# Patient Record
Sex: Male | Born: 1950 | Race: White | Hispanic: No | Marital: Married | Smoking: Former smoker
Health system: Southern US, Community
[De-identification: ages and names within clinical notes are randomized; demographics above are authoritative.]

## PROBLEM LIST (undated history)

## (undated) DIAGNOSIS — K219 Gastro-esophageal reflux disease without esophagitis: Secondary | ICD-10-CM

## (undated) DIAGNOSIS — F32A Depression, unspecified: Secondary | ICD-10-CM

## (undated) DIAGNOSIS — Z87891 Personal history of nicotine dependence: Secondary | ICD-10-CM

## (undated) DIAGNOSIS — F1020 Alcohol dependence, uncomplicated: Secondary | ICD-10-CM

## (undated) DIAGNOSIS — B1920 Unspecified viral hepatitis C without hepatic coma: Secondary | ICD-10-CM

## (undated) DIAGNOSIS — F329 Major depressive disorder, single episode, unspecified: Secondary | ICD-10-CM

## (undated) HISTORY — PX: VASECTOMY: SHX75

## (undated) HISTORY — DX: Unspecified viral hepatitis C without hepatic coma: B19.20

## (undated) HISTORY — PX: COLONOSCOPY: SHX174

## (undated) HISTORY — DX: Depression, unspecified: F32.A

## (undated) HISTORY — DX: Major depressive disorder, single episode, unspecified: F32.9

## (undated) HISTORY — DX: Personal history of nicotine dependence: Z87.891

## (undated) HISTORY — DX: Alcohol dependence, uncomplicated: F10.20

## (undated) HISTORY — DX: Gastro-esophageal reflux disease without esophagitis: K21.9

---

## 2003-12-19 ENCOUNTER — Encounter (INDEPENDENT_AMBULATORY_CARE_PROVIDER_SITE_OTHER): Payer: Self-pay | Admitting: Specialist

## 2003-12-19 ENCOUNTER — Ambulatory Visit (HOSPITAL_COMMUNITY): Admission: RE | Admit: 2003-12-19 | Discharge: 2003-12-19 | Payer: Self-pay | Admitting: Gastroenterology

## 2005-02-07 ENCOUNTER — Encounter: Admission: RE | Admit: 2005-02-07 | Discharge: 2005-02-07 | Payer: Self-pay | Admitting: Family Medicine

## 2005-10-20 LAB — HM COLONOSCOPY

## 2006-07-08 ENCOUNTER — Ambulatory Visit: Payer: Self-pay | Admitting: Family Medicine

## 2006-07-23 ENCOUNTER — Ambulatory Visit: Payer: Self-pay | Admitting: Family Medicine

## 2006-09-22 ENCOUNTER — Ambulatory Visit: Payer: Self-pay | Admitting: Family Medicine

## 2006-11-23 ENCOUNTER — Ambulatory Visit: Payer: Self-pay | Admitting: Family Medicine

## 2007-02-24 ENCOUNTER — Ambulatory Visit: Payer: Self-pay | Admitting: Family Medicine

## 2007-12-14 ENCOUNTER — Ambulatory Visit: Payer: Self-pay | Admitting: Family Medicine

## 2008-09-05 ENCOUNTER — Emergency Department (HOSPITAL_COMMUNITY): Admission: EM | Admit: 2008-09-05 | Discharge: 2008-09-05 | Payer: Self-pay | Admitting: Family Medicine

## 2008-10-16 ENCOUNTER — Ambulatory Visit: Payer: Self-pay | Admitting: Family Medicine

## 2009-05-29 ENCOUNTER — Ambulatory Visit: Payer: Self-pay | Admitting: Family Medicine

## 2009-06-21 ENCOUNTER — Ambulatory Visit: Payer: Self-pay | Admitting: Family Medicine

## 2009-12-31 ENCOUNTER — Ambulatory Visit: Payer: Self-pay | Admitting: Family Medicine

## 2010-05-30 ENCOUNTER — Ambulatory Visit: Payer: Self-pay | Admitting: Family Medicine

## 2010-09-26 ENCOUNTER — Ambulatory Visit: Payer: Self-pay | Admitting: Family Medicine

## 2010-11-26 ENCOUNTER — Ambulatory Visit (INDEPENDENT_AMBULATORY_CARE_PROVIDER_SITE_OTHER): Payer: 59 | Admitting: Family Medicine

## 2010-11-26 DIAGNOSIS — I951 Orthostatic hypotension: Secondary | ICD-10-CM

## 2012-03-02 ENCOUNTER — Encounter: Payer: Self-pay | Admitting: Internal Medicine

## 2012-03-08 ENCOUNTER — Ambulatory Visit (INDEPENDENT_AMBULATORY_CARE_PROVIDER_SITE_OTHER): Payer: 59 | Admitting: Family Medicine

## 2012-03-08 ENCOUNTER — Encounter: Payer: Self-pay | Admitting: Family Medicine

## 2012-03-08 VITALS — BP 122/80 | HR 80 | Ht 68.5 in | Wt 162.0 lb

## 2012-03-08 DIAGNOSIS — Z2911 Encounter for prophylactic immunotherapy for respiratory syncytial virus (RSV): Secondary | ICD-10-CM

## 2012-03-08 DIAGNOSIS — Z8619 Personal history of other infectious and parasitic diseases: Secondary | ICD-10-CM

## 2012-03-08 DIAGNOSIS — J3089 Other allergic rhinitis: Secondary | ICD-10-CM | POA: Insufficient documentation

## 2012-03-08 DIAGNOSIS — K1370 Unspecified lesions of oral mucosa: Secondary | ICD-10-CM

## 2012-03-08 DIAGNOSIS — K219 Gastro-esophageal reflux disease without esophagitis: Secondary | ICD-10-CM

## 2012-03-08 DIAGNOSIS — J302 Other seasonal allergic rhinitis: Secondary | ICD-10-CM

## 2012-03-08 DIAGNOSIS — Z860100 Personal history of colon polyps, unspecified: Secondary | ICD-10-CM | POA: Insufficient documentation

## 2012-03-08 DIAGNOSIS — J309 Allergic rhinitis, unspecified: Secondary | ICD-10-CM

## 2012-03-08 DIAGNOSIS — Z0000 Encounter for general adult medical examination without abnormal findings: Secondary | ICD-10-CM

## 2012-03-08 LAB — POCT URINALYSIS DIPSTICK
Blood, UA: NEGATIVE
Glucose, UA: NEGATIVE
Leukocytes, UA: NEGATIVE
Nitrite, UA: NEGATIVE
Protein, UA: NEGATIVE
Urobilinogen, UA: NEGATIVE

## 2012-03-08 LAB — COMPREHENSIVE METABOLIC PANEL
CO2: 23 mEq/L (ref 19–32)
Calcium: 9.6 mg/dL (ref 8.4–10.5)
Chloride: 106 mEq/L (ref 96–112)
Creat: 1.15 mg/dL (ref 0.50–1.35)
Glucose, Bld: 91 mg/dL (ref 70–99)
Potassium: 4.4 mEq/L (ref 3.5–5.3)

## 2012-03-08 LAB — LIPID PANEL
HDL: 47 mg/dL (ref >39)
LDL Cholesterol: 78 mg/dL (ref 0–99)
Total CHOL/HDL Ratio: 3.3 Ratio

## 2012-03-08 MED ORDER — FLUTICASONE PROPIONATE 50 MCG/ACT NA SUSP: 2.0000 | Freq: Every day | NASAL | Status: DC

## 2012-03-08 NOTE — Progress Notes (Signed)
Subjective:    Patient ID: Harold Day, male    DOB: Aug 05, 1951, 61 y.o.   MRN: [id number]  HPI He is here for a complete examination. He has no particular complaints. He continues on medications listed in the chart. He does get good relief of his reflux symptoms with Prilosec. He does use Afrin nasal spray mainly at night. He does have a seasonal variation of his allergies. He has an underlying history of hepatitis C and has been evaluated for this in the past. Apparently no therapy was recommended. He does have a history of colonic polyps and will need another colonoscopy. He also recently saw his dentist and does have a lesion present in the right lower mandibular mucosal area. He is now semiretired and is keeping herself quite busy. He exercises very regularly and states that he is in the best shape he has ever been in. His social history was reviewed.   Review of Systems  Constitutional: Negative.   HENT: Negative.   Eyes: Negative.   Respiratory: Negative.   Cardiovascular: Negative.   Gastrointestinal: Negative.   Genitourinary: Negative.   Musculoskeletal: Negative.   Skin: Negative.   Neurological: Negative.        Objective:   Physical Exam BP 122/80  Pulse 80  Ht 5' 8.5" (1.74 m)  Wt 162 lb (73.483 kg)  BMI 24.27 kg/m2  General Appearance:    Alert, cooperative, no distress, appears stated age  Head:    Normocephalic, without obvious abnormality, atraumatic  Eyes:    PERRL, conjunctiva/corneas clear, EOM's intact, fundi    benign  Ears:    Normal TM's and external ear canals  Nose:   Nares normal, mucosa normal, no drainage or sinus   tenderness  Throat:   Lips, mucosa, and tongue normal; teeth and gums normal, reddish purple lesion approximately 1 x 0.5 cm noted in the mucosa in the right mandibular area   Neck:   Supple, no lymphadenopathy;  thyroid:  no   enlargement/tenderness/nodules; no carotid   bruit or JVD  Back:    Spine nontender, no curvature, ROM  normal, no CVA     tenderness  Lungs:     Clear to auscultation bilaterally without wheezes, rales or     ronchi; respirations unlabored  Chest Wall:    No tenderness or deformity   Heart:    Regular rate and rhythm, S1 and S2 normal, no murmur, rub   or gallop  Breast Exam:    No chest wall tenderness, masses or gynecomastia  Abdomen:     Soft, non-tender, nondistended, normoactive bowel sounds,    no masses, no hepatosplenomegaly  Genitalia:    Normal male external genitalia without lesions.  Testicles without masses.  No inguinal hernias.  Rectal:    Normal sphincter tone, no masses or tenderness; guaiac negative stool.  Prostate smooth, no nodules, not enlarged.  Extremities:   No clubbing, cyanosis or edema  Pulses:   2+ and symmetric all extremities  Skin:   Skin color, texture, turgor normal, no rashes or lesions  Lymph nodes:   Cervical, supraclavicular, and axillary nodes normal  Neurologic:   CNII-XII intact, normal strength, sensation and gait; reflexes 2+ and symmetric throughout          Psych:   Normal mood, affect, hygiene and grooming.           Assessment & Plan:   1. Routine general medical examination at a health care facility  POCT Urinalysis  Dipstick, Varicella-zoster vaccine subcutaneous, CBC with Differential, Comprehensive metabolic panel, Lipid panel  2. Hx of colonic polyps  [facility] COLONOSCOPY  3. GERD (gastroesophageal reflux disease)    4. History of hepatitis C  Comprehensive metabolic panel  5. Perennial allergic rhinitis with seasonal variation  fluticasone (FLONASE) 50 MCG/ACT nasal spray  6. Oral mucosal lesion  Ambulatory referral to Oral Maxillofacial Surgery   recommend he back off on using the Afrin nasal spray. No further therapy for the hepatitis C. Encouraged him to continue with his present lifestyle.

## 2012-03-08 NOTE — Patient Instructions (Signed)
Add one baby aspirin to your daily regimen

## 2012-03-09 LAB — CBC WITH DIFFERENTIAL/PLATELET
Basophils Relative: 1 % (ref 0–1)
Eosinophils Relative: 1 % (ref 0–5)
Hemoglobin: 15 g/dL (ref 13.0–17.0)
Lymphocytes Relative: 20 % (ref 12–46)
Lymphs Abs: 1.8 10*3/uL (ref 0.7–4.0)
MCHC: 34.5 g/dL (ref 30.0–36.0)
Neutro Abs: 6.4 10*3/uL (ref 1.7–7.7)
Platelets: 237 10*3/uL (ref 150–400)
RDW: 14.1 % (ref 11.5–15.5)

## 2012-03-09 NOTE — Progress Notes (Signed)
Quick Note:  The blood work is normal ______ 

## 2012-04-13 ENCOUNTER — Encounter: Payer: Self-pay | Admitting: Medical

## 2012-04-13 ENCOUNTER — Ambulatory Visit (INDEPENDENT_AMBULATORY_CARE_PROVIDER_SITE_OTHER): Payer: 59 | Admitting: Medical

## 2012-04-13 VITALS — BP 138/80 | HR 68 | Temp 98.0°F | Resp 16 | Wt 160.0 lb

## 2012-04-13 DIAGNOSIS — R002 Palpitations: Secondary | ICD-10-CM

## 2012-04-13 NOTE — Progress Notes (Signed)
Subjective:   HPI  Harold Day is a 61 y.o. male who presents for c/o palpitations.   He denies hx/o heart problems in self or family, no prior cardiology workup.  He notes over the last few days he has had steady frequent skipped heart beats. He notices them each time, gets a thump against his chest.  This is happening daily, somewhat frequent.  He denies pain, SOB, wheezing, fatigue, or dizziness.  No syncope. No recent changes in diet or activity, no excessive caffeine intake.  He is retried, not under stress.  Otherwise has been in his usual state of health.  Recently was here for general recheck and had recent labs in May.   No other aggravating or relieving factors.    No other c/o.  The following portions of the patient's history were reviewed and updated as appropriate: allergies, current medications, past family history, past medical history, past social history, past surgical history and problem list.  No Known Allergies  Current Outpatient Prescriptions on File Prior to Visit  Medication Sig Dispense Refill  . fish oil-omega-3 fatty acids 1000 MG capsule Take 2 g by mouth daily.      . fluticasone (FLONASE) 50 MCG/ACT nasal spray Place 2 sprays into the nose daily.  16 g  11  . Multiple Vitamins-Minerals (MULTIVITAMIN WITH MINERALS) tablet Take 1 tablet by mouth daily.      Marland Kitchen omeprazole (PRILOSEC) 10 MG capsule Take 10 mg by mouth daily.        Past Medical History  Diagnosis Date  . Allergic rhinitis   . Alcoholic   . GERD (gastroesophageal reflux disease)   . Former smoker     quit 2011    Past Surgical History  Procedure Date  . Vasectomy   . Colonoscopy     No family history on file.  History   Social History  . Marital Status: Married    Spouse Name: N/A    Number of Children: N/A  . Years of Education: N/A   Occupational History  . Not on file.   Social History Main Topics  . Smoking status: Former Games developer  . Smokeless tobacco: Current User   Types: Snuff  . Alcohol Use: No  . Drug Use: No  . Sexually Active: Yes   Other Topics Concern  . Not on file   Social History Narrative  . No narrative on file    Reviewed their medical, surgical, family, social, medication, and allergy history and updated chart as appropriate.   Review of Systems ROS reviewed and was negative other than noted in HPI or above.    Objective:   Physical Exam  General appearance: alert, no distress, WD/WN Neck: supple, no lymphadenopathy, no thyromegaly, no masses, no bruits Heart: RRR, normal S1, S2, no murmurs Lungs: CTA bilaterally, no wheezes, rhonchi, or rales Abdomen: +bs, soft, non tender, non distended, no masses, no hepatomegaly, no splenomegaly, no bruits Pulses: occasional skipped beats, but otherwise regular, symmetric UE and LE, 2+ pulses   Adult ECG Report  Indication: palpitations  Rate: 64 bpm  Rhythm: normal sinus rhythm  QRS Axis: 28 degrees  PR Interval:  QRS Duration: 88ms  QTc:  Conduction Disturbances: occasional PVCs  Other Abnormalities: none  Patient's cardiac risk factors are: advanced age (older than 74 for men, 52 for women), male gender, smoking/ tobacco exposure and long term tobacco use, but quit 2011.  EKG comparison: 2012, normal EKG, but today's with PVCs.  Narrative Interpretation: occasional PVCs, otherwise normal EKG    Assessment and Plan :     Encounter Diagnosis  Name Primary?  . Palpitations Yes   EKG with PVCs.  Given his frequent PVCs, risk factors of male age 79yo, long hx/o tobacco use, we will refer to cardiology for evaluation for palpitations and for general cardiac screening.  I reviewed his EKG today, recent labs from May 2013, and discussed his concerns.  He will f/u with cardiology.

## 2012-04-14 ENCOUNTER — Encounter: Payer: Self-pay | Admitting: Medical

## 2012-04-14 ENCOUNTER — Telehealth: Payer: Self-pay | Admitting: Family Medicine

## 2012-04-14 NOTE — Telephone Encounter (Signed)
Message copied by Janeice Robinson on Wed Apr 14, 2012 11:11 AM ------      Message from: Aleen Campi, DAVID S      Created: Wed Apr 14, 2012  7:29 AM       Refer to Dr. Donnie Aho for palpitations and cardiac screening.  Send OV, last labs, both old and new EKG.

## 2012-04-14 NOTE — Telephone Encounter (Signed)
PATIENT IS AWARE OF HIS APPOINTMENT ON 04/14/12 @ 1112 AM. CLS  DR. TILLEY [address] [place], [place]   June 27TH, 13 @ 245 PM    I USED EPIC TO FAX OVER LAST OV NOTES, LABS AND INSURANCE CARD. CLS

## 2012-04-15 ENCOUNTER — Other Ambulatory Visit: Payer: Self-pay | Admitting: Cardiology

## 2012-04-15 DIAGNOSIS — I493 Ventricular premature depolarization: Secondary | ICD-10-CM | POA: Insufficient documentation

## 2012-04-15 NOTE — Progress Notes (Signed)
Harold Day    Date of visit:  04/15/2012 DOB:  25-Jan-1951    Age:  60 yrs. Medical record number:  [id number]     Account number:  [id number] Primary Care Provider: Ernst Breach ____________________________ CURRENT DIAGNOSES  1. Arrhythmia-PVC's(symptomatic)  2. Palpitations  3. Other specified cardiac dysrhythmias  4. Ill-Defined Heart Disease Nec  5. GERD ____________________________ ALLERGIES  NKDA ____________________________ MEDICATIONS  1. Fish Oil 1,000 mg capsule, 2 p.o. daily  2. Flonase 50 mcg/actuation Spray, Suspension, 2 sprays qd  3. multivitamin tablet, 1 p.o. daily  4. omeprazole 10 mg capsule,delayed release(DR/EC), 1 p.o. daily  5. aspirin 81 mg tablet, chewable, 1 p.o. daily ____________________________ CHIEF COMPLAINTS Evaluation of a skipped heartbeat  ____________________________ HISTORY OF PRESENT ILLNESS  This nice 23 ear-old male is seen for evaluation of palpitations. He developed irregularity in his pulse over the past week or so. It was more noticeable at rest but has been a constant feeling as if his heart is skipping or missing a beat. He has been able to work out and denies angina. He has no PND, orthopnea, syncope, palpitations, or claudication. It happens daily. He has not been using over-the-counter decongestants and denies excess caffeine or alcohol consumption. He has had no dizziness or syncope with this.  ____________________________ PAST HISTORY  Past Medical Illnesses:  GERD;  Cardiovascular Illnesses:  no previous history of cardiac disease.;  Surgical Procedures:  vasectomy;  Cardiology Procedures-Invasive:  no history of prior cardiac procedures;  Cardiology Procedures-Noninvasive:  no previous non-invasive procedures;  LVEF not documented ____________________________ CARDIO-PULMONARY TEST DATES EKG Date:  04/15/2012;   ____________________________ FAMILY HISTORY Father - age 50, died of CHF; Mother - age 23, died of breast  cancer and lung cancer, d; Sister 1 - age 32,  alive and well;  ____________________________ SOCIAL HISTORY Alcohol Use:  no alcohol use;  Smoking:  used to smoke but quit 2011;  Diet:  regular diet;  Lifestyle:  married and 2 sons;  Exercise:  gym 4 x week;  Occupation:  retired and Comptroller;  Residence:  lives with wife;   ____________________________ REVIEW OF SYSTEMS General:  denies recent weight change, fatique or change in exercise tolerance.  Integumentary:  no rashes or new skin lesions. Eyes:  wears eye glasses/contact lenses, denies diplopia, glaucoma or visual field defects.  Ears, Nose, Throat, Mouth:  denies any hearing loss, epistaxis, hoarseness or difficulty speaking. Respiratory:  denies dyspnea, cough, wheezing or hemoptysis.  Cardiovascular:  please review HPI  Abdominal:  history of GERD  Genitourinary-Male:  no dysuria, urgency, frequency, or nocturia  Musculoskeletal:  denies arthritis, venous insufficiency, or muscle weakness.  Neurological:  denies headaches, stroke, or TIA  Psychiatric:  denies depession or anxiety.  Hematological/Immunologic:  denies any food allergies, bleeding disorders. ____________________________ PHYSICAL EXAMINATION VITAL SIGNS  Blood Pressure:  130/80 Sitting, Right arm, regular cuff  , 122/76 Standing, Right arm and regular cuff   Pulse:  74/min. Weight:  161.00 lbs. Height:  68.5"BMI: 24  Constitutional:  pleasant white male in no acute distress Skin:  warm and dry to touch, no apparent skin lesions, or masses noted. Head:  normocephalic, normal hair pattern, no masses or tenderness Eyes:  EOMS Intact, PERRLA, C and S clear, Funduscopic exam not done. ENT:  ears, nose and throat reveal no gross abnormalities.  Dentition good. Neck:  supple, without massess. No JVD, thyromegaly or carotid bruits. Carotid upstroke normal. Chest:  normal symmetry, clear to auscultation  and percussion. Cardiac:  regular rhythm, normal S1 and S2,  No S3 or S4, no murmurs, gallops or rubs detected. Abdomen:  abdomen soft,non-tender, no masses, no hepatospenomegaly, or aneurysm noted Peripheral Pulses:  the femoral,dorsalis pedis, and posterior tibial pulses are full and equal bilaterally with no bruits auscultated. Extremities & Back:  no deformities, clubbing, cyanosis, erythema or edema observed. Normal muscle strength and tone. Neurological:  no gross motor or sensory deficits noted, affect appropriate, oriented x3. ____________________________ MOST RECENT LIPID PANEL 03/08/12  CHOL TOTL 156 mg/dl, LDL 78 calc, HDL 47 mg/dl, TRIGLYCER 409 mg/dl and CHOL/HDL 3.3 (Calc) ____________________________ IMPRESSIONS/PLAN  1. Symptomatic PVCs with apparently a normal heart otherwise 2. History of reflux  Recommendations:  I like to confirm that he has a normal heart. He does have PVCs on his EKG but otherwise his EKG was normal. I would like him to have a TSH and for him to have an exercise treadmill test as well as an echo Doppler test to look for cardiomyopathy or valvular disease.. Follow up afterwards. ____________________________ TODAYS ORDERS  1. treadmill:  Regular TM At [facility]  2. 2D, color flow, doppler: First Available  3. TSH: Today  4. 12 Lead EKG: Today                       ____________________________ Cardiology Physician:  Darden Palmer MD [facility]

## 2012-04-28 ENCOUNTER — Other Ambulatory Visit: Payer: Self-pay | Admitting: Cardiology

## 2012-04-30 ENCOUNTER — Encounter: Payer: Self-pay | Admitting: Family Medicine

## 2012-05-03 ENCOUNTER — Encounter: Payer: Self-pay | Admitting: Family Medicine

## 2012-05-13 ENCOUNTER — Ambulatory Visit (INDEPENDENT_AMBULATORY_CARE_PROVIDER_SITE_OTHER): Payer: 59 | Admitting: Family Medicine

## 2012-05-13 ENCOUNTER — Encounter: Payer: Self-pay | Admitting: Family Medicine

## 2012-05-13 VITALS — BP 120/84 | HR 84 | Temp 98.2°F | Ht 68.5 in | Wt 160.0 lb

## 2012-05-13 DIAGNOSIS — R197 Diarrhea, unspecified: Secondary | ICD-10-CM

## 2012-05-13 NOTE — Patient Instructions (Addendum)
Viral Gastroenteritis Viral gastroenteritis is also known as stomach flu. This condition affects the stomach and intestinal tract. It can cause sudden diarrhea and vomiting. The illness typically lasts 3 to 8 days. Most people develop an immune response that eventually gets rid of the virus. While this natural response develops, the virus can make you quite ill. CAUSES  Many different viruses can cause gastroenteritis, such as rotavirus or noroviruses. You can catch one of these viruses by consuming contaminated food or water. You may also catch a virus by sharing utensils or other personal items with an infected person or by touching a contaminated surface. SYMPTOMS  The most common symptoms are diarrhea and vomiting. These problems can cause a severe loss of body fluids (dehydration) and a body salt (electrolyte) imbalance. Other symptoms may include:  Fever.   Headache.   Fatigue.   Abdominal pain.  DIAGNOSIS  Your caregiver can usually diagnose viral gastroenteritis based on your symptoms and a physical exam. A stool sample may also be taken to test for the presence of viruses or other infections. TREATMENT  This illness typically goes away on its own. Treatments are aimed at rehydration. The most serious cases of viral gastroenteritis involve vomiting so severely that you are not able to keep fluids down. In these cases, fluids must be given through an intravenous line (IV). HOME CARE INSTRUCTIONS   Drink enough fluids to keep your urine clear or pale yellow. Drink small amounts of fluids frequently and increase the amounts as tolerated.   Ask your caregiver for specific rehydration instructions.   Avoid:   Foods high in sugar.   Alcohol.   Carbonated drinks.   Tobacco.   Juice.   Caffeine drinks.   Extremely hot or cold fluids.   Fatty, greasy foods.   Too much intake of anything at one time.   Dairy products until 24 to 48 hours after diarrhea stops.   You may  consume probiotics. Probiotics are active cultures of beneficial bacteria. They may lessen the amount and number of diarrheal stools in adults. Probiotics can be found in yogurt with active cultures and in supplements.   Wash your hands well to avoid spreading the virus.   Only take over-the-counter or prescription medicines for pain, discomfort, or fever as directed by your caregiver. Do not give aspirin to children. Antidiarrheal medicines are not recommended.   Ask your caregiver if you should continue to take your regular prescribed and over-the-counter medicines.   Keep all follow-up appointments as directed by your caregiver.  SEEK IMMEDIATE MEDICAL CARE IF:   You are unable to keep fluids down.   You do not urinate at least once every 6 to 8 hours.   You develop shortness of breath.   You notice blood in your stool or vomit. This may look like coffee grounds.   You have abdominal pain that increases or is concentrated in one small area (localized).   You have persistent vomiting or diarrhea.   You have a fever.   The patient is a child younger than 3 months, and he or she has a fever.   The patient is a child older than 3 months, and he or she has a fever and persistent symptoms.   The patient is a child older than 3 months, and he or she has a fever and symptoms suddenly get worse.   The patient is a baby, and he or she has no tears when crying.  MAKE SURE YOU:     Understand these instructions.   Will watch your condition.   Will get help right away if you are not doing well or get worse.  Document Released: 10/06/2005 Document Revised: 09/25/2011 Document Reviewed: 07/23/2011 [facility] Patient Information 2012 [place], [place].  Your illness might be viral--can't exclude food poisoning given that you had some undercooked meat.  Either way, since your diarrhea has improved, likely your fevers will improve, and you should be feeling much better in the next few  days.  Bland diet, advance as tolerated. Avoid dairy for about 5-7 days.

## 2012-05-13 NOTE — Progress Notes (Signed)
Chief Complaint  Patient presents with  . Fever    woke up Monday night with high fever, chills. Was ok Tues. Wed fever returned and diarrhea began. Really bad abdominal cramping today. Last dose ibuprofen was about 60 minutes ago-600mg .   HPI: 3 nights ago awoke with subjective fever and chills.  Felt better the next morning, and was fine all day Tuesday.  Yesterday, began with headache, fever started coming back.  Last night the diarrhea began.  Had 3 large episodes of semi-formed stool.  Denies blood in stool or black stools.  Describes the abdominal pain as "the worst cramps I ever had".  Hasn't had any diarrhea today, but ongoing abdominal cramping.  Has had some nausea, but no vomiting.  Decreased appetite, was able to eat 1/2 sandwich today.    Denies any sick contacts, recent antibiotic use.  Denies any travel, camping.  Ate some undercooked chicken at his mother-in-law's on Sunday.  No others got sick, but they didn't eat as much of the meat prior to cooking it further.   Had colonoscopy in past--doesn't recall any h/o diverticulosis.  He is due for a repeat colonoscopy with Dr. Elnoria Howard soon.  Past Medical History  Diagnosis Date  . Allergic rhinitis   . Alcoholic   . GERD (gastroesophageal reflux disease)   . Former smoker     quit 2011  . Hepatitis C     noticed when he tried to give blood; never had illness   Past Surgical History  Procedure Date  . Vasectomy   . Colonoscopy    History   Social History  . Marital Status: Married    Spouse Name: N/A    Number of Children: 2  . Years of Education: N/A   Occupational History  . retired (Armed forces technical officer)    Social History Main Topics  . Smoking status: Former Games developer  . Smokeless tobacco: Current User    Types: Snuff  . Alcohol Use: No     no alcohol since about 2000  . Drug Use: No  . Sexually Active: Yes   Other Topics Concern  . Not on file   Social History Narrative  . No narrative on file   Current  Outpatient Prescriptions on File Prior to Visit  Medication Sig Dispense Refill  . aspirin 81 MG tablet Take 81 mg by mouth daily.      . fish oil-omega-3 fatty acids 1000 MG capsule Take 2 g by mouth daily.      . fluticasone (FLONASE) 50 MCG/ACT nasal spray Place 2 sprays into the nose daily.  16 g  11  . Multiple Vitamins-Minerals (MULTIVITAMIN WITH MINERALS) tablet Take 1 tablet by mouth daily.      Marland Kitchen omeprazole (PRILOSEC) 10 MG capsule Take 10 mg by mouth daily.       No Known Allergies  ROS:  No URI symptoms.  Had bad heartburn a few days ago, prior to the onset of fever.  Denies cough.  Denies chest pain.  Denies skin rashes, myalgias, but having some headaches (possibly related to fever). ee HPI.   PHYSICAL EXAM: BP 120/84  Pulse 84  Temp 98.2 F (36.8 C) (Oral)  Ht 5' 8.5" (1.74 m)  Wt 160 lb (72.576 kg)  BMI 23.97 kg/m2 Well appearing male, in no distress HEENT: conjunctiva and sclera are clear.  Mucus membranes are moist Neck: no lymphadenopathy or mass Heart: regular rate and rhythm Lungs: clear bilaterally Abdomen: soft, nontender, no organomegaly or  mass.  Normal bowel sounds (not hyperactive, or high pitched) Skin: no rash  ASSESSMENT/PLAN:  1. Diarrhea    Etiology is likely either viral, or possibly food-bourne.  Either way, diarrhea is much improved/resolved.  Discussed supportive management including fever control, clear liquids, advancing to [employer], and advancing back to normal diet, avoiding dairy for 5-7 days.  Discussed probiotics.    Return for re-evaluation if persistent high fevers--recommended to get a thermometer to monitor--worsening abdominal pain, ongoing diarrhea, blood in stool, or other concerns

## 2012-06-29 ENCOUNTER — Encounter: Payer: Self-pay | Admitting: Family Medicine

## 2012-06-29 ENCOUNTER — Ambulatory Visit (INDEPENDENT_AMBULATORY_CARE_PROVIDER_SITE_OTHER): Payer: 59 | Admitting: Family Medicine

## 2012-06-29 VITALS — BP 140/88 | HR 74 | Wt 161.0 lb

## 2012-06-29 DIAGNOSIS — M7711 Lateral epicondylitis, right elbow: Secondary | ICD-10-CM

## 2012-06-29 DIAGNOSIS — M7710 Lateral epicondylitis, unspecified elbow: Secondary | ICD-10-CM

## 2012-06-29 NOTE — Patient Instructions (Signed)
Do as many things as you can palms up and open. Heat to the area for 20 minutes 3 times per day. You can also take an anti-inflammatory if you want

## 2012-06-29 NOTE — Progress Notes (Signed)
  Subjective:    Patient ID: Harold Day, male    DOB: [birth year], 61 y.o.   MRN: [id number]  HPI Approximately 2 weeks ago after doing 100 pushups he noted right lateral elbow pain. He has been bothering him since then especially when he picks things up with his palms down   Review of Systems     Objective:   Physical Exam Full motion of the elbow. No swelling is noted. Tender to palpation over the lateral epicondyle.       Assessment & Plan:   1. Lateral epicondylitis of right elbow    discussed doing as many things as possible pounds up and open. Also discussed using heat for 20 minutes 3 times per day and NSAID as needed. He will return here if continued difficulty.

## 2012-09-06 ENCOUNTER — Ambulatory Visit (INDEPENDENT_AMBULATORY_CARE_PROVIDER_SITE_OTHER): Payer: 59 | Admitting: Family Medicine

## 2012-09-06 ENCOUNTER — Encounter: Payer: Self-pay | Admitting: Family Medicine

## 2012-09-06 VITALS — BP 136/80 | Wt 164.0 lb

## 2012-09-06 DIAGNOSIS — M7711 Lateral epicondylitis, right elbow: Secondary | ICD-10-CM

## 2012-09-06 DIAGNOSIS — M7710 Lateral epicondylitis, unspecified elbow: Secondary | ICD-10-CM

## 2012-09-06 NOTE — Progress Notes (Signed)
  Subjective:    Patient ID: Harold Day, male    DOB: 28-Apr-1951, 61 y.o.   MRN: [id number]  HPI He continues had difficulty with right lateral elbow pain. He has tried conservative measures but has not found them useful.   Review of Systems     Objective:   Physical Exam Full motion of the elbow without pain. Tender to palpation over the right lateral epicondyle. Provocative testing also causes pain.       Assessment & Plan:   1. Right lateral epicondylitis   I discussed options with him. We discussed physical therapy versus injection. He would like to have this injected. The elbow was prepped with Betadine. 20 mg of Kenalog and 1 cc of Xylocaine was injected in 2 the area of the right lateral epicondyle being sure to not inject into the muscle tendon area. He did obtain relief of his symptoms.

## 2012-10-26 ENCOUNTER — Ambulatory Visit (INDEPENDENT_AMBULATORY_CARE_PROVIDER_SITE_OTHER): Payer: 59 | Admitting: Family Medicine

## 2012-10-26 ENCOUNTER — Encounter: Payer: Self-pay | Admitting: Family Medicine

## 2012-10-26 VITALS — BP 126/80 | HR 80 | Wt 165.0 lb

## 2012-10-26 DIAGNOSIS — K219 Gastro-esophageal reflux disease without esophagitis: Secondary | ICD-10-CM

## 2012-10-26 NOTE — Patient Instructions (Addendum)
Gastroesophageal Reflux Disease, Adult Gastroesophageal reflux disease (GERD) happens when acid from your stomach flows up into the esophagus. When acid comes in contact with the esophagus, the acid causes soreness (inflammation) in the esophagus. Over time, GERD may create small holes (ulcers) in the lining of the esophagus. CAUSES   Increased body weight. This puts pressure on the stomach, making acid rise from the stomach into the esophagus.  Smoking. This increases acid production in the stomach.  Drinking alcohol. This causes decreased pressure in the lower esophageal sphincter (valve or ring of muscle between the esophagus and stomach), allowing acid from the stomach into the esophagus.  Late evening meals and a full stomach. This increases pressure and acid production in the stomach.  A malformed lower esophageal sphincter. Sometimes, no cause is found. SYMPTOMS   Burning pain in the lower part of the mid-chest behind the breastbone and in the mid-stomach area. This may occur twice a week or more often.  Trouble swallowing.  Sore throat.  Dry cough.  Asthma-like symptoms including chest tightness, shortness of breath, or wheezing. DIAGNOSIS  Your caregiver may be able to diagnose GERD based on your symptoms. In some cases, X-rays and other tests may be done to check for complications or to check the condition of your stomach and esophagus. TREATMENT  Your caregiver may recommend over-the-counter or prescription medicines to help decrease acid production. Ask your caregiver before starting or adding any new medicines.  HOME CARE INSTRUCTIONS   Change the factors that you can control. Ask your caregiver for guidance concerning weight loss, quitting smoking, and alcohol consumption.  Avoid foods and drinks that make your symptoms worse, such as:  Caffeine or alcoholic drinks.  Chocolate.  Peppermint or mint flavorings.  Garlic and onions.  Spicy foods.  Citrus fruits,  such as oranges, lemons, or limes.  Tomato-based foods such as sauce, chili, salsa, and pizza.  Fried and fatty foods.  Avoid lying down for the 3 hours prior to your bedtime or prior to taking a nap.  Eat small, frequent meals instead of large meals.  Wear loose-fitting clothing. Do not wear anything tight around your waist that causes pressure on your stomach.  Raise the head of your bed 6 to 8 inches with wood blocks to help you sleep. Extra pillows will not help.  Only take over-the-counter or prescription medicines for pain, discomfort, or fever as directed by your caregiver.  Do not take aspirin, ibuprofen, or other nonsteroidal anti-inflammatory drugs (NSAIDs). SEEK IMMEDIATE MEDICAL CARE IF:   You have pain in your arms, neck, jaw, teeth, or back.  Your pain increases or changes in intensity or duration.  You develop nausea, vomiting, or sweating (diaphoresis).  You develop shortness of breath, or you faint.  Your vomit is green, yellow, black, or looks like coffee grounds or blood.  Your stool is red, bloody, or black. These symptoms could be signs of other problems, such as heart disease, gastric bleeding, or esophageal bleeding. MAKE SURE YOU:   Understand these instructions.  Will watch your condition.  Will get help right away if you are not doing well or get worse. Document Released: 07/16/2005 Document Revised: 12/29/2011 Document Reviewed: 04/25/2011 [facility] Patient Information 2013 [place], [place]. Double your Prilosec for the next several days and if that doesn't work call me. Avoid foods that you know cause indigestion

## 2012-10-26 NOTE — Progress Notes (Signed)
  Subjective:    Patient ID: Harold Day, male    DOB: Dec 14, 1950, 62 y.o.   MRN: [id number]  HPI He complains of a one-month history of difficulty with mid chest burning especially at night when he lays down. It's intermittent in nature. He has obtained some relief with Gaviscon as well as baking soda. He also notes worse voice occasionally. He does take one Prilosec daily.   Review of Systems     Objective:   Physical Exam Alert and in no distress. Cardiac exam shows regular rhythm without murmurs or gallops. Lungs are clear to auscultation. Abdominal exam shows no masses or tenderness.       Assessment & Plan:   1. GERD (gastroesophageal reflux disease)   Double your Prilosec for the next several days and if that doesn't work call me. Avoid foods that you know cause indigestion

## 2013-03-15 ENCOUNTER — Telehealth: Payer: Self-pay | Admitting: Internal Medicine

## 2013-03-15 MED ORDER — ESOMEPRAZOLE MAGNESIUM 40 MG PO CPDR: 40.0000 mg | DELAYED_RELEASE_CAPSULE | Freq: Every day | ORAL | Status: DC

## 2013-03-15 NOTE — Telephone Encounter (Signed)
PT HAS BEEN DOING 40 MG FOR AND SOME DAYS HE EVEN TAKES 60 MG PLEASE ADVISE

## 2013-03-15 NOTE — Telephone Encounter (Signed)
Pt has been taking prilosec and he states it is not helping any. If you send another med to the [facility] [address]. Pt would like to be called to be informed of change

## 2013-03-15 NOTE — Telephone Encounter (Signed)
Switch to Nexium 40 mg #30 QD PRN refill

## 2013-03-15 NOTE — Telephone Encounter (Signed)
Notes indicate he is taking a very low dose of Prilosec. Check on the dosing and if he's not taking 40mg  have him do that for the next week and let us know

## 2013-03-15 NOTE — Telephone Encounter (Signed)
SENT IN NEXIUM PT INFORMED

## 2013-04-18 ENCOUNTER — Ambulatory Visit: Payer: Self-pay | Admitting: Family Medicine

## 2013-04-25 ENCOUNTER — Other Ambulatory Visit: Payer: Self-pay | Admitting: Family Medicine

## 2013-05-11 ENCOUNTER — Ambulatory Visit (INDEPENDENT_AMBULATORY_CARE_PROVIDER_SITE_OTHER): Payer: BC Managed Care – PPO | Admitting: Family Medicine

## 2013-05-11 ENCOUNTER — Encounter: Payer: Self-pay | Admitting: Family Medicine

## 2013-05-11 VITALS — BP 116/74 | HR 73 | Ht 69.0 in | Wt 160.0 lb

## 2013-05-11 DIAGNOSIS — Z8619 Personal history of other infectious and parasitic diseases: Secondary | ICD-10-CM

## 2013-05-11 DIAGNOSIS — I4949 Other premature depolarization: Secondary | ICD-10-CM

## 2013-05-11 DIAGNOSIS — J302 Other seasonal allergic rhinitis: Secondary | ICD-10-CM

## 2013-05-11 DIAGNOSIS — K219 Gastro-esophageal reflux disease without esophagitis: Secondary | ICD-10-CM

## 2013-05-11 DIAGNOSIS — Z0000 Encounter for general adult medical examination without abnormal findings: Secondary | ICD-10-CM

## 2013-05-11 DIAGNOSIS — J309 Allergic rhinitis, unspecified: Secondary | ICD-10-CM

## 2013-05-11 DIAGNOSIS — J3089 Other allergic rhinitis: Secondary | ICD-10-CM

## 2013-05-11 DIAGNOSIS — I493 Ventricular premature depolarization: Secondary | ICD-10-CM

## 2013-05-11 LAB — CBC WITH DIFFERENTIAL/PLATELET
Eosinophils Absolute: 0.2 10*3/uL (ref 0.0–0.7)
Eosinophils Relative: 2 % (ref 0–5)
Lymphocytes Relative: 20 % (ref 12–46)
Lymphs Abs: 1.6 10*3/uL (ref 0.7–4.0)
MCH: 31 pg (ref 26.0–34.0)
MCHC: 35 g/dL (ref 30.0–36.0)
MCV: 88.4 fL (ref 78.0–100.0)
Neutro Abs: 5.5 10*3/uL (ref 1.7–7.7)
Platelets: 266 10*3/uL (ref 150–400)
RDW: 14.4 % (ref 11.5–15.5)

## 2013-05-11 LAB — HEMOCCULT GUIAC POC 1CARD (OFFICE)

## 2013-05-11 MED ORDER — TRIAMCINOLONE ACETONIDE(NASAL) 55 MCG/ACT NA INHA: 2.0000 | Freq: Every day | NASAL | Status: AC

## 2013-05-11 NOTE — Progress Notes (Signed)
  Subjective:    Patient ID: Harold Day, male    DOB: 07/11/1951, 62 y.o.   MRN: [id number]  HPI He is here for complete examination. He recently had difficulty with pneumonia but at this time is doing quite well and having no chest pain, shortness of breath or coughing. His reflux is under good control. He did switch to Nasacort which he says now is over-the-counter and he is doing well on this. He has not had any difficulty with cardiac irregularity. He has a previous history of difficulty with hep C however has not had blood work in quite some time. He has not had a biopsy. His work and home life are going quite well. Social and family history are unchanged. He does chew tobacco but is unwilling to stop   Review of Systems Negative except as above    Objective:   Physical Exam BP 116/74  Pulse 73  Ht 5\' 9"  (1.753 m)  Wt 160 lb (72.576 kg)  BMI 23.62 kg/m2  General Appearance:    Alert, cooperative, no distress, appears stated age  Head:    Normocephalic, without obvious abnormality, atraumatic  Eyes:    PERRL, conjunctiva/corneas clear, EOM's intact, fundi    benign  Ears:    Normal TM's and external ear canals  Nose:   Nares normal, mucosa normal, no drainage or sinus   tenderness  Throat:  Lips, mucosa, and tongue normal; teeth and gums normal  Neck:   Supple, no lymphadenopathy;  thyroid:  no   enlargement/tenderness/nodules; no carotid   bruit or JVD  Back:    Spine nontender, no curvature, ROM normal, no CVA     tenderness  Lungs:     Clear to auscultation bilaterally without wheezes, rales or     ronchi; respirations unlabored  Chest Wall:    No tenderness or deformity   Heart:    Regular rate and rhythm, S1 and S2 normal, no murmur, rub   or gallop  Breast Exam:    No chest wall tenderness, masses or gynecomastia  Abdomen:     Soft, non-tender, nondistended, normoactive bowel sounds,    no masses, no hepatosplenomegaly  Genitalia:    Normal male external genitalia  without lesions.  Testicles without masses.  No inguinal hernias.  Rectal:    Normal sphincter tone, no masses or tenderness; guaiac negative stool.  Prostate smooth, no nodules, not enlarged.  Extremities:   No clubbing, cyanosis or edema  Pulses:   2+ and symmetric all extremities  Skin:   Skin color, texture, turgor normal, no rashes or lesions  Lymph nodes:   Cervical, supraclavicular, and axillary nodes normal  Neurologic:   CNII-XII intact, normal strength, sensation and gait; reflexes 2+ and symmetric throughout          Psych:   Normal mood, affect, hygiene and grooming.          Assessment & Plan:  Routine general medical examination at a health care facility - Plan: CBC with Differential, Comprehensive metabolic panel, Lipid panel, Hemoccult - 1 Card (office)  GERD (gastroesophageal reflux disease)  Perennial allergic rhinitis with seasonal variation - Plan: triamcinolone (NASACORT AQ) 55 MCG/ACT nasal inhaler  Symptomatic PVCs  History of hepatitis C - Plan: Hepatitis C RNA quantitative  encouraged him to stop chewing tobacco

## 2013-05-12 LAB — COMPREHENSIVE METABOLIC PANEL
ALT: 25 U/L (ref 0–53)
Albumin: 4.5 g/dL (ref 3.5–5.2)
CO2: 26 mEq/L (ref 19–32)
Chloride: 107 mEq/L (ref 96–112)
Creat: 1.17 mg/dL (ref 0.50–1.35)
Potassium: 4.2 mEq/L (ref 3.5–5.3)
Total Bilirubin: 0.5 mg/dL (ref 0.3–1.2)

## 2013-05-12 LAB — LIPID PANEL
HDL: 37 mg/dL — ABNORMAL LOW (ref >39)
LDL Cholesterol: 87 mg/dL (ref 0–99)
Total CHOL/HDL Ratio: 4.5 Ratio
VLDL: 41 mg/dL — ABNORMAL HIGH (ref 0–40)

## 2013-05-16 ENCOUNTER — Encounter: Payer: Self-pay | Admitting: Family Medicine

## 2013-05-16 ENCOUNTER — Other Ambulatory Visit: Payer: Self-pay

## 2013-05-16 DIAGNOSIS — B182 Chronic viral hepatitis C: Secondary | ICD-10-CM

## 2013-05-16 NOTE — Progress Notes (Signed)
Quick Note:  I talked to him. Have him set up to see someone at the hepatitis C clinic. ______

## 2013-05-30 ENCOUNTER — Ambulatory Visit (INDEPENDENT_AMBULATORY_CARE_PROVIDER_SITE_OTHER): Payer: BC Managed Care – PPO | Admitting: Family Medicine

## 2013-05-30 ENCOUNTER — Encounter: Payer: Self-pay | Admitting: Family Medicine

## 2013-05-30 VITALS — BP 126/80 | HR 86 | Wt 161.0 lb

## 2013-05-30 DIAGNOSIS — M7700 Medial epicondylitis, unspecified elbow: Secondary | ICD-10-CM

## 2013-05-30 DIAGNOSIS — M7701 Medial epicondylitis, right elbow: Secondary | ICD-10-CM

## 2013-05-30 NOTE — Patient Instructions (Signed)
Heat for 20 minutes three times a day.Aleve 2 pills twice a day

## 2013-05-30 NOTE — Progress Notes (Signed)
  Subjective:    Patient ID: Harold Day, male    DOB: 1951-09-05, 62 y.o.   MRN: [id number]  HPI 4 days ago while playing golf, he struck a golf ball but ran into sick grass causing some pain in his right medial elbow. He was able to continue to play but has had difficulty since then.   Review of Systems     Objective:   Physical Exam Exam of the right elbow show slight tenderness palpation near the medial epicondyle. No effusion rectum oh systems noted. Good motion of the elbow is noted.       Assessment & Plan:  Medial epicondylitis of elbow, right  recommend conservative care with heat and anti-inflammatory. Also recommend relative rest. He does plan to play golf on Friday.

## 2013-05-31 ENCOUNTER — Encounter: Payer: Self-pay | Admitting: Internal Medicine

## 2013-06-09 ENCOUNTER — Encounter: Payer: Self-pay | Admitting: Family Medicine

## 2013-08-25 ENCOUNTER — Other Ambulatory Visit: Payer: Self-pay

## 2013-09-02 ENCOUNTER — Encounter: Payer: Self-pay | Admitting: Family Medicine

## 2013-09-02 ENCOUNTER — Ambulatory Visit (INDEPENDENT_AMBULATORY_CARE_PROVIDER_SITE_OTHER): Payer: BC Managed Care – PPO | Admitting: Family Medicine

## 2013-09-02 VITALS — BP 120/80 | HR 98 | Wt 165.0 lb

## 2013-09-02 DIAGNOSIS — B356 Tinea cruris: Secondary | ICD-10-CM

## 2013-09-02 NOTE — Progress Notes (Signed)
Teaching Physician: Sharlot Gowda, MD Dictated By: Judithann Graves  Subjective:  Harold Day is a 62 y.o. male who is here for evaluation of a pruritic inguinal rash present for the past 4 to 5 months. When he returned from a trip to mission trip to [place] 5 months ago, he noticed some athlete's foot. This resolved with application of Lotrimin for about a week or two, however a week later he noticed a pruritic rash he thought to be consistent with jock-itch. This has bothered him intermittently for 4.5 months. He has tried Lotrimin, [facility] jock-itch medication, and hydrocortisone each for 1-2 weeks at a time and noticed no resolution of his rash. He is in a monogamous sexual relationship with his wife and his wife has noticed no similar rash. He has had no fever, no scrotal or inguinal pain.   ROS as in subjective.  Objective: Filed Vitals:   09/02/13 0849  BP: 120/80  Pulse: 98    Physical Exam:  Skin: 5 cm intertriginous, raised somewhat erythematous rash along medial aspect of groin and scrotum with well-demarcated borders. Minimal scale.   Assessment and Plan: 1. Tinea cruris: Recommend application of Lamisil AF for three weeks. Alternatively could represent mixed bacterial infection and Harold Day was instructed to RTC if rash fails to resolve.   Dr. Susann Givens was present for the encounter and agrees with the above assessment and plan.

## 2013-09-02 NOTE — Patient Instructions (Signed)
Use Lamisil AF for about 3 weeks and if that doesn't work let me know

## 2013-10-04 ENCOUNTER — Telehealth: Payer: Self-pay | Admitting: Internal Medicine

## 2013-10-04 NOTE — Telephone Encounter (Signed)
Wife states she has a few questions to ask you about pt hepatitis. Please call her

## 2013-10-04 NOTE — Telephone Encounter (Signed)
Call wife on [phone]

## 2013-11-23 ENCOUNTER — Other Ambulatory Visit: Payer: Self-pay | Admitting: Internal Medicine

## 2013-11-23 DIAGNOSIS — C220 Liver cell carcinoma: Secondary | ICD-10-CM

## 2013-11-28 ENCOUNTER — Ambulatory Visit
Admission: RE | Admit: 2013-11-28 | Discharge: 2013-11-28 | Disposition: A | Discharge status: Home / Self Care | Payer: BC Managed Care – PPO | Source: Ambulatory Visit | Attending: Internal Medicine | Admitting: Internal Medicine

## 2013-11-28 DIAGNOSIS — C220 Liver cell carcinoma: Secondary | ICD-10-CM

## 2013-12-19 ENCOUNTER — Ambulatory Visit (INDEPENDENT_AMBULATORY_CARE_PROVIDER_SITE_OTHER): Payer: BC Managed Care – PPO | Admitting: Family Medicine

## 2013-12-19 ENCOUNTER — Encounter: Payer: Self-pay | Admitting: Family Medicine

## 2013-12-19 VITALS — BP 124/80 | HR 72 | Temp 97.6°F | Ht 68.25 in | Wt 166.0 lb

## 2013-12-19 DIAGNOSIS — J069 Acute upper respiratory infection, unspecified: Secondary | ICD-10-CM

## 2013-12-19 MED ORDER — AMOXICILLIN 500 MG PO TABS: 1000.0000 mg | ORAL_TABLET | Freq: Two times a day (BID) | ORAL | Status: DC

## 2013-12-19 NOTE — Progress Notes (Signed)
Chief Complaint  Patient presents with  . sinus infection    sinus infection since saturday morning. no fever. having chills. having alittle chest congstion   A week ago he was on an airplane--about 2 days later he started with congestion, runny nose, slight sore throat (about 5 days ago).  It seems to have settled in his sinuses. Pain is between his eyes. He feels cold all the time, and very sleepy/fatigued.  Nasal mucus is thick, sometimes bloody, creamy white, sometimes yellow-green in the mornings. Some PND, very mild cough.  Denies chills or fevers, just feels cold all the time.  Denies sick contacts.  He was in [place] in 90 degree weather, and came home to freezing temps..  Compliant with nasal steroid use for allergies.  He has been using Nyquil at night, and a generic mucinex type of syrup (unsure of the ingredients)  +snuff user, not interested in quitting.  Past Medical History  Diagnosis Date  . Allergic rhinitis   . Alcoholic   . GERD (gastroesophageal reflux disease)   . Former smoker     quit 2011  . Hepatitis C     noticed when he tried to give blood; never had illness   Past Surgical History  Procedure Laterality Date  . Vasectomy    . Colonoscopy     History   Social History  . Marital Status: Married    Spouse Name: N/A    Number of Children: 2  . Years of Education: N/A   Occupational History  . retired (Counsellor)    Social History Main Topics  . Smoking status: Former Research scientist (life sciences)  . Smokeless tobacco: Current User    Types: Snuff  . Alcohol Use: No     Comment: no alcohol since about 2000  . Drug Use: No  . Sexual Activity: Yes   Other Topics Concern  . Not on file   Social History Narrative  . No narrative on file   Current Outpatient Prescriptions on File Prior to Visit  Medication Sig Dispense Refill  . aspirin 81 MG tablet Take 81 mg by mouth daily.      . fish oil-omega-3 fatty acids 1000 MG capsule Take 2 g by mouth daily.      Marland Kitchen  ibuprofen (ADVIL,MOTRIN) 200 MG tablet Take 600 mg by mouth every 4 (four) hours.      . Multiple Vitamins-Minerals (MULTIVITAMIN WITH MINERALS) tablet Take 1 tablet by mouth daily.      Marland Kitchen omeprazole (PRILOSEC) 20 MG capsule Take 20 mg by mouth 2 (two) times daily.      Marland Kitchen triamcinolone (NASACORT AQ) 55 MCG/ACT nasal inhaler Place 2 sprays into the nose daily.  1 Inhaler  12   No current facility-administered medications on file prior to visit.   No Known Allergies  ROS:  Denies nausea, vomiting, diarrhea, skin rashes, bleeding, bruising. No dizziness, chest pain, palpitations, edema.  See HPI for other details.  PHYSICAL EXAM: BP 124/80  Pulse 72  Temp(Src) 97.6 F (36.4 C) (Oral)  Ht 5' 8.25" (1.734 m)  Wt 166 lb (75.297 kg)  BMI 25.04 kg/m2  Well developed, pleasant male, with nasal congestion/stuffy sounding HEENT:  PERRL, EOMi, conjunctiva clear.  R TM not well visualized due to cerumen. L TM and EAC normal.  OP is clear. Nasal mucosa is moderately-severely edematous, mildly erythematous.  White mucus with some yellow crusting noted bilaterally.  He blew his nose and creamy white mucus was in tissue.  Mildly tender over both frontal sinuses, nontender over maxillary sinuses Neck: no lymphadenopathy or mass Heart: regular rate and rhythm without murmur Lungs: clear bilaterally Skin: no rash Psych: normal mood, affect  ASSESSMENT/PLAN:  Acute upper respiratory infections of unspecified site - Plan: amoxicillin (AMOXIL) 500 MG tablet  URI in patient with h/o chronic allergies. Cannot r/o early sinus infection vs sinus congestion from URI.  Start sinus rinses twice daily. Continue expectorant (guaifenesin). Use decongestant (phenylephrine or pseudoephedrine) to help with sinus pain and dry up the mucus You need to use dextromethorphan (DM) if you have a cough--it is a cough suppressant  Start the antibiotic in another 2-4 days if your symptoms are clearly worsening, rather  than improving (increasing sinus pain, continued discolored mucus, fevers, etc).   Counseled re: smokeless tobacco risks and encouraged cessation.

## 2013-12-19 NOTE — Patient Instructions (Signed)
Start sinus rinses twice daily. Continue expectorant (guaifenesin). Use decongestant (phenylephrine or pseudoephedrine) to help with sinus pain and dry up the mucus You need to use dextromethorphan (DM) if you have a cough--it is a cough suppressant  Start the antibiotic in another 2-4 days if your symptoms are clearly worsening, rather than improving (increasing sinus pain, continued discolored mucus, fevers, etc).  Please try and quit using smokeless tobacco products which can increase the risk of oral cancers.

## 2014-02-23 ENCOUNTER — Encounter: Payer: Self-pay | Admitting: Family Medicine

## 2014-02-23 ENCOUNTER — Telehealth: Payer: Self-pay | Admitting: Family Medicine

## 2014-02-23 ENCOUNTER — Ambulatory Visit (INDEPENDENT_AMBULATORY_CARE_PROVIDER_SITE_OTHER): Payer: BC Managed Care – PPO | Admitting: Family Medicine

## 2014-02-23 VITALS — BP 130/82 | HR 64 | Wt 162.0 lb

## 2014-02-23 DIAGNOSIS — M7711 Lateral epicondylitis, right elbow: Secondary | ICD-10-CM

## 2014-02-23 DIAGNOSIS — M7710 Lateral epicondylitis, unspecified elbow: Secondary | ICD-10-CM

## 2014-02-23 MED ORDER — DICLOFENAC SODIUM 1 % TD GEL: 2.0000 g | Freq: Four times a day (QID) | TRANSDERMAL | Status: DC

## 2014-02-23 NOTE — Telephone Encounter (Signed)
Pt has tried Aleve, Ibuprofen & Tylenol

## 2014-02-23 NOTE — Progress Notes (Signed)
   Subjective:    Patient ID: Harold Day, male    DOB: 1951/04/24, 63 y.o.   MRN: [id number]  HPI He is here for evaluation of right lateral elbow pain. This occurred after playing 4 days in a row of golf. In the past it had difficulty with this and did get an injection. He is interested in having another injection.   Review of Systems     Objective:   Physical Exam Tender to palpation over the right lateral epicondyles with provocative testing being positive. Elbow has full motion.       Assessment & Plan:  Lateral epicondylitis of right elbow - Plan: diclofenac sodium (VOLTAREN) 1 % GEL  I explained that I thought an injection would be overly aggressive considering the injury. I will treat with Voltaren cream, rest. Also discussed possibly changing the grips on his golf clubs to make them slightly bigger. Return here if symptoms continue

## 2014-02-27 NOTE — Telephone Encounter (Signed)
P.A. Voltaren gel denied, only covered for treatment with osteoarthritis of the elbow, wrist, hand,knee or ankle Do you want to switch to something else?

## 2014-02-28 MED ORDER — DICLOFENAC SODIUM 75 MG PO TBEC: 75.0000 mg | DELAYED_RELEASE_TABLET | Freq: Two times a day (BID) | ORAL | Status: DC

## 2014-02-28 NOTE — Telephone Encounter (Signed)
Pt.notified

## 2014-02-28 NOTE — Telephone Encounter (Signed)
Let him know that I called in the pill form of a cream.

## 2014-02-28 NOTE — Telephone Encounter (Signed)
Voltaren cream not covered. I will switch him to 75 twice a day

## 2014-07-16 ENCOUNTER — Emergency Department (HOSPITAL_BASED_OUTPATIENT_CLINIC_OR_DEPARTMENT_OTHER)
Admission: EM | Admit: 2014-07-16 | Discharge: 2014-07-16 | Disposition: A | Discharge status: Home / Self Care | Payer: BC Managed Care – PPO | Attending: Emergency Medicine | Admitting: Emergency Medicine

## 2014-07-16 ENCOUNTER — Encounter (HOSPITAL_BASED_OUTPATIENT_CLINIC_OR_DEPARTMENT_OTHER): Payer: Self-pay | Admitting: Emergency Medicine

## 2014-07-16 ENCOUNTER — Emergency Department (HOSPITAL_BASED_OUTPATIENT_CLINIC_OR_DEPARTMENT_OTHER): Payer: BC Managed Care – PPO

## 2014-07-16 DIAGNOSIS — Z8619 Personal history of other infectious and parasitic diseases: Secondary | ICD-10-CM | POA: Diagnosis not present

## 2014-07-16 DIAGNOSIS — Z87891 Personal history of nicotine dependence: Secondary | ICD-10-CM | POA: Insufficient documentation

## 2014-07-16 DIAGNOSIS — Z79899 Other long term (current) drug therapy: Secondary | ICD-10-CM | POA: Insufficient documentation

## 2014-07-16 DIAGNOSIS — K219 Gastro-esophageal reflux disease without esophagitis: Secondary | ICD-10-CM | POA: Diagnosis not present

## 2014-07-16 DIAGNOSIS — R05 Cough: Secondary | ICD-10-CM | POA: Insufficient documentation

## 2014-07-16 DIAGNOSIS — K2100 Gastro-esophageal reflux disease with esophagitis, without bleeding: Secondary | ICD-10-CM

## 2014-07-16 DIAGNOSIS — Z7982 Long term (current) use of aspirin: Secondary | ICD-10-CM | POA: Diagnosis not present

## 2014-07-16 DIAGNOSIS — R071 Chest pain on breathing: Secondary | ICD-10-CM | POA: Insufficient documentation

## 2014-07-16 DIAGNOSIS — R0789 Other chest pain: Secondary | ICD-10-CM

## 2014-07-16 DIAGNOSIS — M549 Dorsalgia, unspecified: Secondary | ICD-10-CM | POA: Diagnosis present

## 2014-07-16 DIAGNOSIS — R059 Cough, unspecified: Secondary | ICD-10-CM | POA: Insufficient documentation

## 2014-07-16 DIAGNOSIS — Z791 Long term (current) use of non-steroidal anti-inflammatories (NSAID): Secondary | ICD-10-CM | POA: Diagnosis not present

## 2014-07-16 MED ORDER — HYDROCODONE-ACETAMINOPHEN 5-325 MG PO TABS: 2.0000 | ORAL_TABLET | ORAL | Status: DC | PRN

## 2014-07-16 MED ORDER — METOCLOPRAMIDE HCL 10 MG PO TABS: 10.0000 mg | ORAL_TABLET | Freq: Three times a day (TID) | ORAL | Status: DC

## 2014-07-16 NOTE — ED Notes (Signed)
MD at bedside discussing test results and dispo plan of care. 

## 2014-07-16 NOTE — ED Notes (Signed)
Pain in right low rib area of back since last night. Sts he believes he has pneumonia.  Sts he had it about 6 months ago and this is what it felt like.  Sts he had a bad episode of coughing from his reflux 2 nights ago and he believes that is what started this.

## 2014-07-16 NOTE — Discharge Instructions (Signed)
Chest Wall Pain Chest wall pain is pain in or around the bones and muscles of your chest. It may take up to 6 weeks to get better. It may take longer if you must stay physically active in your work and activities.  CAUSES  Chest wall pain may happen on its own. However, it may be caused by:  A viral illness like the flu.  Injury.  Coughing.  Exercise.  Arthritis.  Fibromyalgia.  Shingles. HOME CARE INSTRUCTIONS   Avoid overtiring physical activity. Try not to strain or perform activities that cause pain. This includes any activities using your chest or your abdominal and side muscles, especially if heavy weights are used.  Put ice on the sore area.  Put ice in a plastic bag.  Place a towel between your skin and the bag.  Leave the ice on for 15-20 minutes per hour while awake for the first 2 days.  Only take over-the-counter or prescription medicines for pain, discomfort, or fever as directed by your caregiver. SEEK IMMEDIATE MEDICAL CARE IF:   Your pain increases, or you are very uncomfortable.  You have a fever.  Your chest pain becomes worse.  You have new, unexplained symptoms.  You have nausea or vomiting.  You feel sweaty or lightheaded.  You have a cough with phlegm (sputum), or you cough up blood. MAKE SURE YOU:   Understand these instructions.  Will watch your condition.  Will get help right away if you are not doing well or get worse. Document Released: 10/06/2005 Document Revised: 12/29/2011 Document Reviewed: 06/02/2011 [facility] Patient Information [address], [place]. This information is not intended to replace advice given to you by your health care provider. Make sure you discuss any questions you have with your health care provider.   Gastroesophageal Reflux Disease, Adult Gastroesophageal reflux disease (GERD) happens when acid from your stomach flows up into the esophagus. When acid comes in contact with the esophagus, the acid causes  soreness (inflammation) in the esophagus. Over time, GERD may create small holes (ulcers) in the lining of the esophagus. CAUSES   Increased body weight. This puts pressure on the stomach, making acid rise from the stomach into the esophagus.  Smoking. This increases acid production in the stomach.  Drinking alcohol. This causes decreased pressure in the lower esophageal sphincter (valve or ring of muscle between the esophagus and stomach), allowing acid from the stomach into the esophagus.  Late evening meals and a full stomach. This increases pressure and acid production in the stomach.  A malformed lower esophageal sphincter. Sometimes, no cause is found. SYMPTOMS   Burning pain in the lower part of the mid-chest behind the breastbone and in the mid-stomach area. This may occur twice a week or more often.  Trouble swallowing.  Sore throat.  Dry cough.  Asthma-like symptoms including chest tightness, shortness of breath, or wheezing. DIAGNOSIS  Your caregiver may be able to diagnose GERD based on your symptoms. In some cases, X-rays and other tests may be done to check for complications or to check the condition of your stomach and esophagus. TREATMENT  Your caregiver may recommend over-the-counter or prescription medicines to help decrease acid production. Ask your caregiver before starting or adding any new medicines.  HOME CARE INSTRUCTIONS   Change the factors that you can control. Ask your caregiver for guidance concerning weight loss, quitting smoking, and alcohol consumption.  Avoid foods and drinks that make your symptoms worse, such as:  Caffeine or alcoholic drinks.  Chocolate.  Peppermint or mint flavorings.  Garlic and onions.  Spicy foods.  Citrus fruits, such as oranges, lemons, or limes.  Tomato-based foods such as sauce, chili, salsa, and pizza.  Fried and fatty foods.  Avoid lying down for the 3 hours prior to your bedtime or prior to taking a  nap.  Eat small, frequent meals instead of large meals.  Wear loose-fitting clothing. Do not wear anything tight around your waist that causes pressure on your stomach.  Raise the head of your bed 6 to 8 inches with wood blocks to help you sleep. Extra pillows will not help.  Only take over-the-counter or prescription medicines for pain, discomfort, or fever as directed by your caregiver.  Do not take aspirin, ibuprofen, or other nonsteroidal anti-inflammatory drugs (NSAIDs). SEEK IMMEDIATE MEDICAL CARE IF:   You have pain in your arms, neck, jaw, teeth, or back.  Your pain increases or changes in intensity or duration.  You develop nausea, vomiting, or sweating (diaphoresis).  You develop shortness of breath, or you faint.  Your vomit is green, yellow, black, or looks like coffee grounds or blood.  Your stool is red, bloody, or black. These symptoms could be signs of other problems, such as heart disease, gastric bleeding, or esophageal bleeding. MAKE SURE YOU:   Understand these instructions.  Will watch your condition.  Will get help right away if you are not doing well or get worse. Document Released: 07/16/2005 Document Revised: 12/29/2011 Document Reviewed: 04/25/2011 [facility] Patient Information 2015 [place], [place]. This information is not intended to replace advice given to you by your health care provider. Make sure you discuss any questions you have with your health care provider.  Make sure you discuss any questions you have with your health care provider.

## 2014-07-16 NOTE — ED Provider Notes (Signed)
CSN: [id number]     Arrival date & time 07/16/14  0913 History   First MD Initiated Contact with Patient 07/16/14 [phone]     Chief Complaint  Patient presents with  . Back Pain       HPI  Vision presents evaluation of right anterior chest pain. He gets episodes of reflux quite often no causing the cough. It is 2 nights ago. Has pain in the right anterolateral chest that is sharp and pleuritic and worse with breathing. When he had his episode of reflux he started coughing and has had symptoms since. Is not so short of breath. No fevers no chills. No sputum production.  Takes twice a day Prilosec for his reflux but is still symptomatic almost daily.  Past Medical History  Diagnosis Date  . Allergic rhinitis   . Alcoholic   . GERD (gastroesophageal reflux disease)   . Former smoker     quit 2011  . Hepatitis C     noticed when he tried to give blood; never had illness   Past Surgical History  Procedure Laterality Date  . Vasectomy    . Colonoscopy     No family history on file. History  Substance Use Topics  . Smoking status: Former Research scientist (life sciences)  . Smokeless tobacco: Current User    Types: Snuff  . Alcohol Use: No     Comment: no alcohol since about 2000    Review of Systems  Constitutional: Negative for fever, chills, diaphoresis, appetite change and fatigue.  HENT: Negative for mouth sores, sore throat and trouble swallowing.   Eyes: Negative for visual disturbance.  Respiratory: Positive for cough. Negative for chest tightness, shortness of breath and wheezing.   Cardiovascular: Positive for chest pain.  Gastrointestinal: Negative for nausea, vomiting, abdominal pain, diarrhea and abdominal distention.       Reflux symptoms  Endocrine: Negative for polydipsia, polyphagia and polyuria.  Genitourinary: Negative for dysuria, frequency and hematuria.  Musculoskeletal: Negative for gait problem.  Skin: Negative for color change, pallor and rash.  Neurological: Negative for  dizziness, syncope, light-headedness and headaches.  Hematological: Does not bruise/bleed easily.  Psychiatric/Behavioral: Negative for behavioral problems and confusion.      Allergies  Review of patient's allergies indicates no known allergies.  Home Medications   Prior to Admission medications   Medication Sig Start Date End Date Taking? Authorizing Provider  aspirin 81 MG tablet Take 81 mg by mouth daily.    Historical Provider, MD  diclofenac (VOLTAREN) 75 MG EC tablet Take 1 tablet (75 mg total) by mouth 2 (two) times daily. 02/28/14   Denita Lung, MD  diclofenac sodium (VOLTAREN) 1 % GEL Apply 2 g topically 4 (four) times daily. 02/23/14   Denita Lung, MD  fish oil-omega-3 fatty acids 1000 MG capsule Take 2 g by mouth daily.    Historical Provider, MD  ibuprofen (ADVIL,MOTRIN) 200 MG tablet Take 600 mg by mouth every 4 (four) hours.    Historical Provider, MD  metoCLOPramide (REGLAN) 10 MG tablet Take 1 tablet (10 mg total) by mouth 4 (four) times daily -  before meals and at bedtime. 07/16/14   Tanna Furry, MD  Multiple Vitamins-Minerals (MULTIVITAMIN WITH MINERALS) tablet Take 1 tablet by mouth daily.    Historical Provider, MD  omeprazole (PRILOSEC) 20 MG capsule Take 20 mg by mouth 2 (two) times daily.    Historical Provider, MD  triamcinolone (NASACORT AQ) 55 MCG/ACT nasal inhaler Place 2 sprays into the nose  daily. 05/11/13   Denita Lung, MD   BP 128/68  Pulse 67  Temp(Src) 97.8 F (36.6 C)  Resp 16  Ht 5\' 8"  (1.727 m)  Wt 165 lb (74.844 kg)  BMI 25.09 kg/m2  SpO2 97% Physical Exam  Constitutional: He is oriented to person, place, and time. He appears well-developed and well-nourished. No distress.  HENT:  Head: Normocephalic.  Eyes: Conjunctivae are normal. Pupils are equal, round, and reactive to light. No scleral icterus.  Neck: Normal range of motion. Neck supple. No thyromegaly present.  Cardiovascular: Normal rate and regular rhythm.  Exam reveals no  gallop and no friction rub.   No murmur heard. Pulmonary/Chest: Effort normal and breath sounds normal. No respiratory distress. He has no wheezes. He has no rales.    Abdominal: Soft. Bowel sounds are normal. He exhibits no distension. There is no tenderness. There is no rebound.  Musculoskeletal: Normal range of motion.  Neurological: He is alert and oriented to person, place, and time.  Skin: Skin is warm and dry. No rash noted.  Psychiatric: He has a normal mood and affect. His behavior is normal.    ED Course  Procedures (including critical care time) Labs Review Labs Reviewed - No data to display  Imaging Review Dg Chest 2 View  07/16/2014   CLINICAL DATA:  Right upper back pain.  Cough.  EXAM: CHEST  2 VIEW  COMPARISON:  02/07/2005  FINDINGS: Linear densities along the anterior chest on the lateral view. Some of these densities were present on the previous examination and this may represent scarring and/or atelectasis. Otherwise, the lungs are clear without airspace disease or edema. Heart and mediastinum are within normal limits. No acute bone abnormality. Trachea is midline.  IMPRESSION: Linear densities in the anterior chest could represent a combination of atelectasis and scarring. No focal airspace disease.   Electronically Signed   By: Markus Daft M.D.   On: 07/16/2014 10:14     EKG Interpretation None      MDM   Final diagnoses:  Gastroesophageal reflux disease with esophagitis  Chest wall pain   Facial linear atelectasis at the right base. No obvious infiltrate or signs of aspiration. No obvious rib fracture clinically or radiographically. Discussion his main concerning symptoms are his reflux symptoms. He does take proton pump inhibitor. He does not use alcohol, tobacco, caffeine, or anti-inflammatories. We discussed reflux precautions small frequent meals avoiding meals before bed. Given GI referral. Prescription for Reglan. Vicodin to use when necessary for his chest  wall pain    Tanna Furry, MD 07/16/14 1102

## 2014-07-16 NOTE — ED Notes (Signed)
MD at bedside. 

## 2014-09-04 ENCOUNTER — Other Ambulatory Visit: Payer: Self-pay | Admitting: Gastroenterology

## 2014-09-04 DIAGNOSIS — R1084 Generalized abdominal pain: Secondary | ICD-10-CM

## 2014-09-11 ENCOUNTER — Ambulatory Visit (HOSPITAL_COMMUNITY)
Admission: RE | Admit: 2014-09-11 | Payer: BC Managed Care – PPO | Source: Ambulatory Visit | Admitting: Gastroenterology

## 2014-09-11 ENCOUNTER — Encounter (HOSPITAL_COMMUNITY): Admission: RE | Payer: Self-pay | Source: Ambulatory Visit

## 2014-09-11 SURGERY — MANOMETRY, ESOPHAGUS

## 2014-09-13 ENCOUNTER — Ambulatory Visit
Admission: RE | Admit: 2014-09-13 | Discharge: 2014-09-13 | Disposition: A | Discharge status: Home / Self Care | Payer: BC Managed Care – PPO | Source: Ambulatory Visit | Attending: Gastroenterology | Admitting: Gastroenterology

## 2014-09-13 DIAGNOSIS — R1084 Generalized abdominal pain: Secondary | ICD-10-CM

## 2014-09-18 ENCOUNTER — Encounter (HOSPITAL_COMMUNITY)
Admission: RE | Disposition: A | Discharge status: Home / Self Care | Payer: Self-pay | Source: Ambulatory Visit | Attending: Gastroenterology

## 2014-09-18 ENCOUNTER — Ambulatory Visit (HOSPITAL_COMMUNITY)
Admission: RE | Admit: 2014-09-18 | Discharge: 2014-09-18 | Disposition: A | Discharge status: Home / Self Care | Payer: BC Managed Care – PPO | Source: Ambulatory Visit | Attending: Gastroenterology | Admitting: Gastroenterology

## 2014-09-18 DIAGNOSIS — K219 Gastro-esophageal reflux disease without esophagitis: Secondary | ICD-10-CM | POA: Diagnosis present

## 2014-09-18 HISTORY — PX: 24 HOUR PH STUDY: SHX5419

## 2014-09-18 HISTORY — PX: ESOPHAGEAL MANOMETRY: SHX5429

## 2014-09-18 SURGERY — MANOMETRY, ESOPHAGUS

## 2014-09-18 MED ORDER — LIDOCAINE VISCOUS 2 % MT SOLN
OROMUCOSAL | Status: AC
  Filled 2014-09-18: qty 15

## 2014-09-18 SURGICAL SUPPLY — 2 items
FACESHIELD LNG OPTICON STERILE (SAFETY) IMPLANT
GLOVE BIO SURGEON STRL SZ8 (GLOVE) ×4 IMPLANT

## 2014-09-19 ENCOUNTER — Encounter (HOSPITAL_COMMUNITY): Payer: Self-pay | Admitting: Gastroenterology

## 2014-09-19 NOTE — H&P (Signed)
Patient here today for an esophageal manometry and pH impedence.

## 2015-04-08 ENCOUNTER — Other Ambulatory Visit: Payer: Self-pay | Admitting: Family Medicine

## 2015-04-09 NOTE — Telephone Encounter (Signed)
Is this okay to refill? 

## 2015-05-30 ENCOUNTER — Telehealth: Payer: Self-pay | Admitting: Internal Medicine

## 2015-05-30 ENCOUNTER — Ambulatory Visit (INDEPENDENT_AMBULATORY_CARE_PROVIDER_SITE_OTHER): Payer: 59 | Admitting: Family Medicine

## 2015-05-30 ENCOUNTER — Encounter: Payer: Self-pay | Admitting: Family Medicine

## 2015-05-30 VITALS — BP 144/90 | HR 70 | Wt 162.0 lb

## 2015-05-30 DIAGNOSIS — D229 Melanocytic nevi, unspecified: Secondary | ICD-10-CM | POA: Diagnosis not present

## 2015-05-30 NOTE — Progress Notes (Signed)
   Subjective:    Patient ID: Harold Day, male    DOB: [birth year], 64 y.o.   MRN: [id number]  HPI Pt here for a mole on top of his head that he noticed a couple of days ago. He is concerned about it and would like to see a Dermatologist for this. States no other skin issues currently. Denies fever, chills, weight change, fatigue. He is a smoker. He is taking care of his wife who has multiple health issues including melanoma.    Review of Systems   Pertinent items are noted in HPI. All other system negative.  Objective:   Physical Exam Alert and in no distress. Dark brown raised nevus with slightly irregular borders and diameter greater than 61mm on top anterior of head approximately 2 inches posterior to hairline.        Assessment & Plan:  Suspicious nevus - Plan: Ambulatory referral to Dermatology  Referral made to [facility]. I think it is appropriate for him to have dermatologist evaluate this area based on suspicious features. Will follow up accordingly.

## 2015-05-30 NOTE — Telephone Encounter (Signed)
Pt is scheduled with [facility] on 06/11/15 @2 :10pm with Dr. Harriett Sine. . I have faxed over [facility] referral to them as well

## 2015-07-05 ENCOUNTER — Ambulatory Visit (INDEPENDENT_AMBULATORY_CARE_PROVIDER_SITE_OTHER): Payer: 59 | Admitting: Family Medicine

## 2015-07-05 ENCOUNTER — Encounter: Payer: Self-pay | Admitting: Family Medicine

## 2015-07-05 ENCOUNTER — Ambulatory Visit
Admission: RE | Admit: 2015-07-05 | Discharge: 2015-07-05 | Disposition: A | Discharge status: Home / Self Care | Payer: 59 | Source: Ambulatory Visit | Attending: Family Medicine | Admitting: Family Medicine

## 2015-07-05 VITALS — BP 118/72 | HR 60 | Ht 69.0 in | Wt 162.0 lb

## 2015-07-05 DIAGNOSIS — Z86010 Personal history of colonic polyps: Secondary | ICD-10-CM

## 2015-07-05 DIAGNOSIS — K219 Gastro-esophageal reflux disease without esophagitis: Secondary | ICD-10-CM

## 2015-07-05 DIAGNOSIS — M79644 Pain in right finger(s): Secondary | ICD-10-CM

## 2015-07-05 DIAGNOSIS — Z0000 Encounter for general adult medical examination without abnormal findings: Secondary | ICD-10-CM | POA: Diagnosis not present

## 2015-07-05 DIAGNOSIS — Z23 Encounter for immunization: Secondary | ICD-10-CM

## 2015-07-05 DIAGNOSIS — J3089 Other allergic rhinitis: Secondary | ICD-10-CM

## 2015-07-05 DIAGNOSIS — J309 Allergic rhinitis, unspecified: Secondary | ICD-10-CM

## 2015-07-05 DIAGNOSIS — Z8619 Personal history of other infectious and parasitic diseases: Secondary | ICD-10-CM | POA: Diagnosis not present

## 2015-07-05 DIAGNOSIS — J302 Other seasonal allergic rhinitis: Secondary | ICD-10-CM

## 2015-07-05 LAB — CBC WITH DIFFERENTIAL/PLATELET
BASOS ABS: 0.1 10*3/uL (ref 0.0–0.1)
Basophils Relative: 1 % (ref 0–1)
EOS PCT: 2 % (ref 0–5)
Eosinophils Absolute: 0.2 10*3/uL (ref 0.0–0.7)
HEMATOCRIT: 43.8 % (ref 39.0–52.0)
Hemoglobin: 15.1 g/dL (ref 13.0–17.0)
LYMPHS ABS: 1.9 10*3/uL (ref 0.7–4.0)
LYMPHS PCT: 19 % (ref 12–46)
MCH: 30.9 pg (ref 26.0–34.0)
MCHC: 34.5 g/dL (ref 30.0–36.0)
MCV: 89.8 fL (ref 78.0–100.0)
MONO ABS: 0.5 10*3/uL (ref 0.1–1.0)
MPV: 10.3 fL (ref 8.6–12.4)
Monocytes Relative: 5 % (ref 3–12)
Neutro Abs: 7.3 10*3/uL (ref 1.7–7.7)
Neutrophils Relative %: 73 % (ref 43–77)
Platelets: 242 10*3/uL (ref 150–400)
RBC: 4.88 MIL/uL (ref 4.22–5.81)
RDW: 14 % (ref 11.5–15.5)
WBC: 10 10*3/uL (ref 4.0–10.5)

## 2015-07-05 LAB — LIPID PANEL
CHOL/HDL RATIO: 5.7 ratio — AB (ref <=5.0)
CHOLESTEROL: 200 mg/dL (ref 125–200)
HDL: 35 mg/dL — ABNORMAL LOW (ref >=40)
LDL Cholesterol: 131 mg/dL — ABNORMAL HIGH (ref <130)
Triglycerides: 168 mg/dL — ABNORMAL HIGH (ref <150)
VLDL: 34 mg/dL — ABNORMAL HIGH (ref <30)

## 2015-07-05 LAB — COMPREHENSIVE METABOLIC PANEL
ALK PHOS: 55 U/L (ref 40–115)
ALT: 15 U/L (ref 9–46)
AST: 21 U/L (ref 10–35)
Albumin: 4.5 g/dL (ref 3.6–5.1)
BUN: 16 mg/dL (ref 7–25)
CALCIUM: 9.7 mg/dL (ref 8.6–10.3)
CO2: 24 mmol/L (ref 20–31)
Chloride: 104 mmol/L (ref 98–110)
Creat: 1.09 mg/dL (ref 0.70–1.25)
GLUCOSE: 91 mg/dL (ref 65–99)
POTASSIUM: 4.4 mmol/L (ref 3.5–5.3)
Sodium: 137 mmol/L (ref 135–146)
Total Bilirubin: 0.7 mg/dL (ref 0.2–1.2)
Total Protein: 7.1 g/dL (ref 6.1–8.1)

## 2015-07-05 LAB — POCT URINALYSIS DIPSTICK
BILIRUBIN UA: NEGATIVE
Glucose, UA: NEGATIVE
Ketones, UA: NEGATIVE
Leukocytes, UA: NEGATIVE
Nitrite, UA: NEGATIVE
Protein, UA: NEGATIVE
RBC UA: NEGATIVE
SPEC GRAV UA: 1.015
Urobilinogen, UA: NEGATIVE
pH, UA: 6.5

## 2015-07-05 NOTE — Progress Notes (Signed)
Subjective:    Patient ID: Harold Day, male    DOB: June 17, 1951, 64 y.o.   MRN: [id number]  HPI He is here for complete examination. His main complaint today is right thumb pain. He has a previous history of thumb fracture and is now noting difficulty with using the thumb appropriately. He even notes trouble holding his coffee cup. It also is interfering with playing golf. He does have a history of hepatitis C and has had Harvoni and apparently his most recent viral loads were now undetectable. He does have a previous history of PVCs but states that they were never truly symptomatic and he has not had any of these in several years. He does have occasional difficulty with reflux but no symptoms recently. His allergies are under good control. He has had a colonoscopy. His work and home life as well as health maintenance and immunizations were reviewed. His had no chest pain, shortness of breath, abdominal pain.   Review of Systems  All other systems reviewed and are negative.      Objective:   Physical Exam BP 118/72 mmHg  Pulse 60  Ht 5\' 9"  (1.753 m)  Wt 162 lb (73.483 kg)  BMI 23.91 kg/m2  General Appearance:    Alert, cooperative, no distress, appears stated age  Head:    Normocephalic, without obvious abnormality, atraumatic  Eyes:    PERRL, conjunctiva/corneas clear, EOM's intact, fundi    benign  Ears:    Normal TM's and external ear canals  Nose:   Nares normal, mucosa normal, no drainage or sinus   tenderness  Throat:   Lips, mucosa, and tongue normal; teeth and gums normal  Neck:   Supple, no lymphadenopathy;  thyroid:  no   enlargement/tenderness/nodules; no carotid   bruit or JVD  Back:    Spine nontender, no curvature, ROM normal, no CVA     tenderness  Lungs:     Clear to auscultation bilaterally without wheezes, rales or     ronchi; respirations unlabored  Chest Wall:    No tenderness or deformity   Heart:    Regular rate and rhythm, S1 and S2 normal, no murmur,  rub   or gallop  Breast Exam:    No chest wall tenderness, masses or gynecomastia  Abdomen:     Soft, non-tender, nondistended, normoactive bowel sounds,    no masses, no hepatosplenomegaly  Genitalia:    Normal male external genitalia without lesions.  Testicles without masses.  No inguinal hernias.  Rectal:    Normal sphincter tone, no masses or tenderness; guaiac negative stool.  Prostate smooth, no nodules, not enlarged.  Extremities:   No clubbing, cyanosis or edema. Exam of the right thumb does show some pain with motion of the MCP joint. No palpable tenderness but decreased range of motion is noted  Pulses:   2+ and symmetric all extremities  Skin:   Skin color, texture, turgor normal, no rashes or lesions  Lymph nodes:   Cervical, supraclavicular, and axillary nodes normal  Neurologic:   CNII-XII intact, normal strength, sensation and gait; reflexes 2+ and symmetric throughout          Psych:   Normal mood, affect, hygiene and grooming.          Assessment & Plan:  Routine general medical examination at a health care facility - Plan: Flu Vaccine QUAD 36+ mos IM, CBC with Differential/Platelet, Comprehensive metabolic panel, Lipid panel  Needs flu shot - Plan: POCT urinalysis  dipstick  Thumb pain, right - Plan: DG Finger Thumb Right, Ambulatory referral to Orthopedic Surgery  History of hepatitis C  Gastroesophageal reflux disease without esophagitis  Perennial allergic rhinitis with seasonal variation  Hx of colonic polyps  Immunization due - Plan: Hepatitis A vaccine adult IM He will continue on his present medication regimen. I'm quite happy that he is doing better on the hepatitis C and is now essentially cured. He will be referred to hand surgery

## 2015-08-06 ENCOUNTER — Encounter: Payer: Self-pay | Admitting: Family Medicine

## 2015-08-06 ENCOUNTER — Ambulatory Visit (INDEPENDENT_AMBULATORY_CARE_PROVIDER_SITE_OTHER): Payer: 59 | Admitting: Family Medicine

## 2015-08-06 VITALS — BP 124/82 | HR 68 | Ht 69.0 in | Wt 162.0 lb

## 2015-08-06 DIAGNOSIS — S39011A Strain of muscle, fascia and tendon of abdomen, initial encounter: Secondary | ICD-10-CM

## 2015-08-06 DIAGNOSIS — Z23 Encounter for immunization: Secondary | ICD-10-CM | POA: Diagnosis not present

## 2015-08-06 DIAGNOSIS — Z638 Other specified problems related to primary support group: Secondary | ICD-10-CM

## 2015-08-06 NOTE — Progress Notes (Signed)
   Subjective:    Patient ID: Harold Day, male    DOB: 1951/10/15, 64 y.o.   MRN: [id number]  HPI he is here for his second hepatitis A shot. He does travel internationally fairly frequently. He also is had recent treatment for hepatitis C and his viral load is nondetectable. He also complains of intermittent left mid abdominal discomfort when he moves too quickly. He does remember injuring his abdominal muscles several months ago. He is also dealing with his oldest son who finally admitted that he is depressed. He is interested in getting him to see if physician in another city as he does live in [place].  Review of Systems     Objective:   Physical Exam Alert and in no distress. Abdominal exam shows no tenderness or swelling.       Assessment & Plan:  Need for prophylactic vaccination and inoculation against viral hepatitis - Plan: Hepatitis A vaccine adult IM  Stress due to family tension  Abdominal muscle strain, initial encounter  recommend conservative care for the abdominal strain. He was given the name of a physician in the [place] area

## 2015-09-18 ENCOUNTER — Encounter: Payer: Self-pay | Admitting: Family Medicine

## 2015-09-18 ENCOUNTER — Ambulatory Visit (INDEPENDENT_AMBULATORY_CARE_PROVIDER_SITE_OTHER): Payer: 59 | Admitting: Family Medicine

## 2015-09-18 VITALS — BP 130/80 | HR 78 | Ht 69.0 in | Wt 165.0 lb

## 2015-09-18 DIAGNOSIS — M7582 Other shoulder lesions, left shoulder: Secondary | ICD-10-CM | POA: Diagnosis not present

## 2015-09-18 NOTE — Progress Notes (Signed)
   Subjective:    Patient ID: Harold Day, male    DOB: 05/03/1951, 64 y.o.   MRN: [id number]  HPI Approximately 4 weeks ago he was doing a lot of stirring using his arms and developed some left shoulder pain after this. No popping, locking or weakness.   Review of Systems     Objective:   Physical Exam Full motion of the shoulder with pain on drop arm test and supraspinatus testing. Neer's and Hawkins test was also uncomfortable.       Assessment & Plan:  Rotator cuff tendinitis, left  recommend conservative care with 2 Aleve twice per day supplemented with Tylenol and if continued difficulty after 2 weeks, return here for probable injection. He was comfortable with that approach.

## 2015-09-18 NOTE — Patient Instructions (Signed)
82 Aleve twice per day and you can supplement this with 2 Tylenol 4 times per day. Do this for the next 10 days to 2 weeks and if no improvement come back

## 2015-12-11 ENCOUNTER — Ambulatory Visit: Payer: Self-pay | Admitting: Family Medicine

## 2015-12-11 ENCOUNTER — Ambulatory Visit (INDEPENDENT_AMBULATORY_CARE_PROVIDER_SITE_OTHER): Payer: BLUE CROSS/BLUE SHIELD | Admitting: Family Medicine

## 2015-12-11 ENCOUNTER — Encounter: Payer: Self-pay | Admitting: Family Medicine

## 2015-12-11 VITALS — BP 148/90 | HR 95 | Ht 69.0 in | Wt 164.2 lb

## 2015-12-11 DIAGNOSIS — M25512 Pain in left shoulder: Secondary | ICD-10-CM | POA: Diagnosis not present

## 2015-12-11 DIAGNOSIS — M7582 Other shoulder lesions, left shoulder: Secondary | ICD-10-CM | POA: Diagnosis not present

## 2015-12-11 MED ORDER — LIDOCAINE HCL 2 % IJ SOLN: 10.0000 mL | Freq: Once | INTRAMUSCULAR | Status: DC

## 2015-12-11 MED ORDER — TRIAMCINOLONE ACETONIDE 40 MG/ML IJ SUSP
40.0000 mg | Freq: Once | INTRAMUSCULAR | Status: AC
  Administered 2015-12-11: 40 mg via INTRAMUSCULAR

## 2015-12-11 MED ORDER — LIDOCAINE HCL 2 % IJ SOLN
60.0000 mg | Freq: Once | INTRAMUSCULAR | Status: AC
  Administered 2015-12-11: 60 mg via INTRADERMAL

## 2015-12-11 NOTE — Progress Notes (Signed)
   Subjective:    Patient ID: Harold Day, male    DOB: 1951-07-20, 65 y.o.   MRN: [id number]  HPI Tinge to have difficulty with left shoulder pain. He notes this especially when he abducts his shoulder. He does feel slight clicking sensation in the shoulder. He has a golf trip planned to [place] and wants the shoulder in better condition.  Review of Systems     Objective:   Physical Exam Full motion of the shoulder. No palpable tenderness. Drop arm test was uncomfortable. Supraspinatus testing was also uncomfortable. Neer's and Hawkin's test was negative.       Assessment & Plan:  Rotator cuff tendinitis, left  Pain in joint of left shoulder - Plan: triamcinolone acetonide (KENALOG-40) injection 40 mg, lidocaine (XYLOCAINE) 2 % (with pres) injection 60 mg, DISCONTINUED: lidocaine (XYLOCAINE) 2 % (with pres) injection 200 mg is interested in an injection. The left shoulder was prepped in the posterolateral position. 40 mg of Kenalog and 3 mL of Xylocaine was injected into the subacromial bursa without difficulty. He tolerated the procedure well and did note a relatively quick pain relief.

## 2016-02-12 ENCOUNTER — Encounter: Payer: Self-pay | Admitting: Family Medicine

## 2016-02-12 ENCOUNTER — Ambulatory Visit (INDEPENDENT_AMBULATORY_CARE_PROVIDER_SITE_OTHER): Payer: BLUE CROSS/BLUE SHIELD | Admitting: Family Medicine

## 2016-02-12 VITALS — BP 144/90 | HR 77 | Wt 163.4 lb

## 2016-02-12 DIAGNOSIS — F32A Depression, unspecified: Secondary | ICD-10-CM

## 2016-02-12 DIAGNOSIS — F329 Major depressive disorder, single episode, unspecified: Secondary | ICD-10-CM

## 2016-02-12 MED ORDER — CITALOPRAM HYDROBROMIDE 20 MG PO TABS: 20.0000 mg | ORAL_TABLET | Freq: Every day | ORAL | Status: DC

## 2016-02-12 NOTE — Patient Instructions (Signed)
Call Darryl Hyers  [phone]

## 2016-02-12 NOTE — Progress Notes (Signed)
   Subjective:    Patient ID: Harold Day, male    DOB: [birth year], 65 y.o.   MRN: [id number]  HPI He is here for consult concerning depression. He states that this started roughly one year ago. About that time his present wife was diagnosed with breast cancer. He also found out that one of his sons is bipolar but is not seeking appropriate care. He has noted mood swings, crying, anxiety, feeling overwhelmed. Also he admits to not fully resolving his feelings towards his first wife dying approximately 35 years ago.  Review of Systems     Objective:   Physical Exam Alert and appropriately dressed and tearful       Assessment & Plan:  Depression - Plan: citalopram (CELEXA) 20 MG tablet He clearly admits to being depressed and is seeking help. I discussed the fact that he is not fully resolve the issues with his first wife dying and then emotional trauma of dealing with his present wife and son. He also has another child who is autistic and he admits to not treating this individual as well as he should. I will start him on Celexa. Also encouraged him to get involved in counseling with Darryl Hyers. He does have a trip planned to [place] golf in approximately 1 month. Follow-up here in 2 weeks.

## 2016-02-26 ENCOUNTER — Telehealth: Payer: Self-pay | Admitting: Family Medicine

## 2016-02-26 ENCOUNTER — Encounter: Payer: Self-pay | Admitting: Family Medicine

## 2016-02-26 ENCOUNTER — Ambulatory Visit (INDEPENDENT_AMBULATORY_CARE_PROVIDER_SITE_OTHER): Payer: BLUE CROSS/BLUE SHIELD | Admitting: Family Medicine

## 2016-02-26 VITALS — BP 144/82 | HR 64 | Wt 161.8 lb

## 2016-02-26 DIAGNOSIS — F329 Major depressive disorder, single episode, unspecified: Secondary | ICD-10-CM

## 2016-02-26 DIAGNOSIS — F32A Depression, unspecified: Secondary | ICD-10-CM

## 2016-02-26 NOTE — Telephone Encounter (Signed)
[place] [phone] t/w Tom advised him Dr. Redmond School ok'd pt getting his Celexa early since he is going on vacation.  Gershon Mussel said the refill would go through but insurance would not approve, Gershon Mussel said pt to call his insurance and request a vacation refill.  Called pt advised of same, he will do.

## 2016-02-26 NOTE — Progress Notes (Signed)
   Subjective:    Patient ID: Harold Day, male    DOB: 11/20/1950, 65 y.o.   MRN: [id number]  HPI  he is here for a recheck visit. He states that after he left he felt better. He states that it is like going to [place] and admitting that she have a problem. He has also seen his therapist and things are going well there. He states that he is at least 75% better but does sometimes wake up feeling groggy. Hip implant [place] for golf and is now very much looking forward to it. Prior to this he was dreading going.  he expressed an interest in having a higher dose.  Review of Systems     Objective:   Physical Exam  alert and in no distress with appropriate affect      Assessment & Plan:  Depression  I explained that he does not at this point have the full effect of the medication and I would like to wait weight comes back from his trip and we might possibly readjust medicine at that point. He is comfortable with this. He is to call me when he returns at the end of the month

## 2016-02-26 NOTE — Patient Instructions (Signed)
Call me when you return from your trip

## 2016-03-18 ENCOUNTER — Encounter: Payer: Self-pay | Admitting: Family Medicine

## 2016-03-18 ENCOUNTER — Ambulatory Visit (INDEPENDENT_AMBULATORY_CARE_PROVIDER_SITE_OTHER): Payer: BLUE CROSS/BLUE SHIELD | Admitting: Family Medicine

## 2016-03-18 VITALS — BP 150/90 | HR 78 | Wt 159.0 lb

## 2016-03-18 DIAGNOSIS — F329 Major depressive disorder, single episode, unspecified: Secondary | ICD-10-CM | POA: Diagnosis not present

## 2016-03-18 DIAGNOSIS — R030 Elevated blood-pressure reading, without diagnosis of hypertension: Secondary | ICD-10-CM

## 2016-03-18 DIAGNOSIS — F32A Depression, unspecified: Secondary | ICD-10-CM

## 2016-03-18 DIAGNOSIS — N400 Enlarged prostate without lower urinary tract symptoms: Secondary | ICD-10-CM

## 2016-03-18 DIAGNOSIS — IMO0001 Reserved for inherently not codable concepts without codable children: Secondary | ICD-10-CM

## 2016-03-18 MED ORDER — CITALOPRAM HYDROBROMIDE 40 MG PO TABS: 40.0000 mg | ORAL_TABLET | Freq: Every day | ORAL | Status: DC

## 2016-03-18 MED ORDER — FINASTERIDE 5 MG PO TABS: 5.0000 mg | ORAL_TABLET | Freq: Every day | ORAL | Status: DC

## 2016-03-18 NOTE — Progress Notes (Signed)
Subjective:     Patient ID: Harold Day, male   DOB: [birth year], 65 y.o.   MRN: [id number]  HPI  Mr. Goytia presents to clinic today with a gradual development of decreased urinary flow and incomplete emptying that have worsened over the past year, but has been most noticeable over the past 3-4 weeks.  He denies inc. frequency, dysuria, abnormal discharge, fever, chills, night sweats, no loss of sensation or lower extremity weakness.  His only change in medications was his citalopram added 4 weeks ago.  He is sexually active in a monogamous relationship with no new sexual partners and does not smoke or use alcohol.    He states that his citalopram 20 mg has helped with his symptoms, but he still has some problems with his "nerves." Additionally he has been taking his blood pressure daily at the gym and has been noticing his bp around 150/80 and was wondering if he needs to be treated for an elevated bp.   Review of Systems     Objective:   Physical Exam Alert and in no distress.  Cardiac exam shows a regular sinus rhythm without murmurs or gallops.  Lungs are clear to auscultation. Lower extremity 5/5 strength bilaterally.  Pt. declined digital rectal exam  bp -- 150/90     Assessment / Plan:      BPH  Will start with finasteride to stop enlargement / shrink his enlarged prostate.   I explained that the medication will not work right away and will take a month or 2 before he might see any results.  Depression Given persistence of some depressive/ anxiety symptoms, will increase the dose to 40 mg today.  He will follow up in 1 month on the efficacy of this new dose.  Elevated bp Given his age (65 in 1 month) and current guidelines, will not add medication at this time.  We will reconsider if bp rises further.     History and physical exam conducted by Marylen Ponto (Medical Student) in conjunction with Dr. Redmond School. Over 25 minutes, greater than 50% spent in counseling and  coordination of care with

## 2016-05-22 ENCOUNTER — Other Ambulatory Visit: Payer: Self-pay

## 2016-05-22 ENCOUNTER — Telehealth: Payer: Self-pay

## 2016-05-22 DIAGNOSIS — R350 Frequency of micturition: Secondary | ICD-10-CM

## 2016-05-22 NOTE — Telephone Encounter (Signed)
Faxed referral to [facility]. 

## 2016-05-22 NOTE — Telephone Encounter (Signed)
Spoke with pt- he states finasteride is not helping him. He states he is still having to try to urinate 2-3 times in the morning to completely empty his bladder. [facility]   Pharmacy [facility]   CB [phone]

## 2016-05-22 NOTE — Telephone Encounter (Signed)
Let's refer him to urology and get their input.

## 2016-07-17 ENCOUNTER — Encounter: Payer: Self-pay | Admitting: Family Medicine

## 2016-07-17 ENCOUNTER — Ambulatory Visit (INDEPENDENT_AMBULATORY_CARE_PROVIDER_SITE_OTHER): Payer: PPO | Admitting: Family Medicine

## 2016-07-17 VITALS — BP 130/80 | HR 78 | Ht 68.5 in | Wt 161.6 lb

## 2016-07-17 DIAGNOSIS — M25512 Pain in left shoulder: Secondary | ICD-10-CM

## 2016-07-17 DIAGNOSIS — L821 Other seborrheic keratosis: Secondary | ICD-10-CM | POA: Insufficient documentation

## 2016-07-17 DIAGNOSIS — Z86010 Personal history of colonic polyps: Secondary | ICD-10-CM

## 2016-07-17 DIAGNOSIS — K219 Gastro-esophageal reflux disease without esophagitis: Secondary | ICD-10-CM | POA: Diagnosis not present

## 2016-07-17 DIAGNOSIS — Z125 Encounter for screening for malignant neoplasm of prostate: Secondary | ICD-10-CM

## 2016-07-17 DIAGNOSIS — N400 Enlarged prostate without lower urinary tract symptoms: Secondary | ICD-10-CM

## 2016-07-17 DIAGNOSIS — Z23 Encounter for immunization: Secondary | ICD-10-CM | POA: Diagnosis not present

## 2016-07-17 DIAGNOSIS — J302 Other seasonal allergic rhinitis: Secondary | ICD-10-CM

## 2016-07-17 DIAGNOSIS — J309 Allergic rhinitis, unspecified: Secondary | ICD-10-CM | POA: Diagnosis not present

## 2016-07-17 DIAGNOSIS — Z0000 Encounter for general adult medical examination without abnormal findings: Secondary | ICD-10-CM

## 2016-07-17 DIAGNOSIS — Z8619 Personal history of other infectious and parasitic diseases: Secondary | ICD-10-CM

## 2016-07-17 DIAGNOSIS — F1021 Alcohol dependence, in remission: Secondary | ICD-10-CM | POA: Insufficient documentation

## 2016-07-17 DIAGNOSIS — F329 Major depressive disorder, single episode, unspecified: Secondary | ICD-10-CM | POA: Diagnosis not present

## 2016-07-17 DIAGNOSIS — F32A Depression, unspecified: Secondary | ICD-10-CM

## 2016-07-17 DIAGNOSIS — J3089 Other allergic rhinitis: Secondary | ICD-10-CM

## 2016-07-17 DIAGNOSIS — F1020 Alcohol dependence, uncomplicated: Secondary | ICD-10-CM | POA: Diagnosis not present

## 2016-07-17 LAB — LIPID PANEL
Cholesterol: 239 mg/dL — ABNORMAL HIGH (ref 125–200)
HDL: 34 mg/dL — ABNORMAL LOW (ref >=40)
LDL Cholesterol: 143 mg/dL — ABNORMAL HIGH (ref <130)
TRIGLYCERIDES: 309 mg/dL — AB (ref <150)
Total CHOL/HDL Ratio: 7 Ratio — ABNORMAL HIGH (ref <=5.0)
VLDL: 62 mg/dL — ABNORMAL HIGH (ref <30)

## 2016-07-17 LAB — COMPREHENSIVE METABOLIC PANEL
ALK PHOS: 51 U/L (ref 40–115)
ALT: 16 U/L (ref 9–46)
AST: 21 U/L (ref 10–35)
Albumin: 4.6 g/dL (ref 3.6–5.1)
BILIRUBIN TOTAL: 0.5 mg/dL (ref 0.2–1.2)
BUN: 16 mg/dL (ref 7–25)
CHLORIDE: 101 mmol/L (ref 98–110)
CO2: 22 mmol/L (ref 20–31)
CREATININE: 1.21 mg/dL (ref 0.70–1.25)
Calcium: 9.7 mg/dL (ref 8.6–10.3)
GLUCOSE: 96 mg/dL (ref 65–99)
POTASSIUM: 4.1 mmol/L (ref 3.5–5.3)
Sodium: 135 mmol/L (ref 135–146)
Total Protein: 7.2 g/dL (ref 6.1–8.1)

## 2016-07-17 LAB — CBC WITH DIFFERENTIAL/PLATELET
BASOS ABS: 96 {cells}/uL (ref 0–200)
Basophils Relative: 1 %
EOS ABS: 96 {cells}/uL (ref 15–500)
Eosinophils Relative: 1 %
HCT: 43.7 % (ref 38.5–50.0)
Hemoglobin: 15.1 g/dL (ref 13.2–17.1)
LYMPHS PCT: 21 %
Lymphs Abs: 2016 cells/uL (ref 850–3900)
MCH: 31.1 pg (ref 27.0–33.0)
MCHC: 34.6 g/dL (ref 32.0–36.0)
MCV: 89.9 fL (ref 80.0–100.0)
MONOS PCT: 7 %
MPV: 10.2 fL (ref 7.5–12.5)
Monocytes Absolute: 672 cells/uL (ref 200–950)
Neutro Abs: 6720 cells/uL (ref 1500–7800)
Neutrophils Relative %: 70 %
Platelets: 272 10*3/uL (ref 140–400)
RBC: 4.86 MIL/uL (ref 4.20–5.80)
RDW: 14 % (ref 11.0–15.0)
WBC: 9.6 10*3/uL (ref 4.0–10.5)

## 2016-07-17 LAB — PSA: PSA: 0.9 ng/mL (ref <=4.0)

## 2016-07-17 LAB — POCT URINALYSIS DIPSTICK
Bilirubin, UA: NEGATIVE
Blood, UA: NEGATIVE
GLUCOSE UA: NEGATIVE
Ketones, UA: NEGATIVE
LEUKOCYTES UA: NEGATIVE
NITRITE UA: NEGATIVE
PH UA: 6.5
Protein, UA: NEGATIVE
Spec Grav, UA: 1.01
Urobilinogen, UA: NEGATIVE

## 2016-07-17 MED ORDER — BUPROPION HCL ER (SR) 150 MG PO TB12: 150.0000 mg | ORAL_TABLET | Freq: Every day | ORAL | 1 refills | Status: DC

## 2016-07-17 MED ORDER — TRIAMCINOLONE ACETONIDE 40 MG/ML IJ SUSP
40.0000 mg | Freq: Once | INTRAMUSCULAR | Status: AC
  Administered 2016-07-17: 40 mg via INTRAMUSCULAR

## 2016-07-17 MED ORDER — LIDOCAINE HCL 2 % IJ SOLN
3.0000 mL | Freq: Once | INTRAMUSCULAR | Status: AC
  Administered 2016-07-17: 60 mg via INTRADERMAL

## 2016-07-17 NOTE — Progress Notes (Signed)
Subjective:   HPI  Harold Day is a 65 y.o. male who presents for a complete physical and IPPE  Medical care team includes:  Sees another physician for hepatitis C  [facility] u[facility] Preventative care: Last ophthalmology visit: 6 months ago Last dental visit:appointmnet next Last colonoscopy:05/31/13 Last prostate exam: Appointment scheduled with urology for further evaluation of BPH Last [id number] 04/28/12 Last labs9/15/16  Prior vaccinations: TD or Tdap:07/23/06 Influenza: 07/05/15 Pneumococcal:07/16/16 Shingles/Zostavax:5/20/13Other:   Advanced directive:yes Health care power of attorney:yes   Concerns:He continues have difficulty with BPH symptoms of decreased stream, hesitancy and feeling of incomplete emptying. He is scheduled to see urology. He presently is on finasteride. He does have underlying reflux disease and states that probiotics has helped a lot with that. Psychologically he feels much better on Celexa but still does notice some slight difference several times per week with the feeling of jitteriness especially in his hands. He realizes that the depression really revolved around the issue of not dealing effectively with grieving from his first wife 59 death.He also has a lesion present on hiseft temporal area that he would like evaluated. His allergies are under good control and he does use Nasacort. He is again having difficulty with intermittent left shoulder pain. He did get an injection which helped tremendously for an extended period of time. He notes difficulty with abduction as well as internal his as well as slight external rotation of the shoulder. His home life is going quite well although the having some slight difficulties with his mentally challenged son. He remains alcohol free. He has no issues with falls or cognitive dysfunction. He does get follow-up concerning his hepatitis C through that clinic. He also has a previous history of colonic polyps and has had a  colonoscopy. Reviewed their medical, surgical, family, social, medication, and allergy history and updated chart as appropriate.    Review of Systems Constitutional: -fever, -chills, -sweats, -unexpected weight change, -decreased appetite, -fatigue Allergy: -sneezing, -itching, -congestion Dermatology: -changing moles, --rash, -lumps ENT: -runny nose, -ear pain, -sore throat, -hoarseness, -sinus pain, -teeth pain, - ringing in ears, -hearing loss, -nosebleeds Cardiology: -chest pain, -palpitations, -swelling, -difficulty breathing when lying flat, -waking up short of breath Respiratory: -cough, -shortness of breath, -difficulty breathing with exercise or exertion, -wheezing, -coughing up blood Gastroenterology: -abdominal pain, -nausea, -vomiting, -diarrhea, -constipation, -blood in stool, -changes in bowel movement, -difficulty swallowing or eating Hematology: -bleeding, -bruising  Musculoskeletal:-back pain, -neck pain, -cramping, -changes in gait Ophthalmology: denies vision changes, eye redness, itching, discharge Urology: -burning with urination, -difficulty urinating, -blood in urine, -urinary frequency, -urgency, -incontinence Neurology: -headache, -weakness, -tingling, -numbness, -memory loss, -falls, -dizziness Psychology: -depressed mood, -agitation, -sleep problems     Objective:   Physical Exam  General appearance: alert, no distress, WD/WN,  Skin: Brownish pigmented lesion noted on the left temporal area. HEENT: normocephalic, conjunctiva/corneas normal, sclerae anicteric, PERRLA, EOMi, nares patent, no discharge or erythema, pharynx normal Oral cavity: MMM, tongue normal, teeth normal Neck: supple, no lymphadenopathy, no thyromegaly, no masses, normal ROM Chest: non tender, normal shape and expansion Heart: RRR, normal S1, S2, no murmurs Lungs: CTA bilaterally, no wheezes, rhonchi, or rales Abdomen: +bs, soft, non tender, non distended, no masses, no hepatomegaly, no  splenomegaly, no bruits Back: non tender, normal ROM, no scoliosis Musculoskeletal: full motion of the left shoulder with some pain on internal next several rotation. Negative sulcus sign and no joint laxity is noted. Extremities: no edema, no cyanosis, no clubbing Pulses: 2+ symmetric, upper  and lower extremities, normal cap refill Neurological: alert, oriented x 3, CN2-12 intact, strength normal upper extremities and lower extremities, sensation normal throughout, DTRs 2+ throughout, no cerebellar signs, gait normal Psychiatric: normal affect, behavior normal, pleasant  Previous abdominal exam showed negative for aneurysm.  Assessment and Plan :   Routine general medical examination at a health care facility - Plan: CBC with Differential/Platelet, Comprehensive metabolic panel, Lipid panel, POCT Urinalysis Dipstick  Need for prophylactic vaccination against Streptococcus pneumoniae (pneumococcus) - Plan: Pneumococcal conjugate vaccine 13-valent  Need for prophylactic vaccination and inoculation against influenza - Plan: Flu vaccine HIGH DOSE PF (Fluzone High dose)  Screening for prostate cancer - Plan: PSA  Depression  BPH (benign prostatic hyperplasia)  Perennial allergic rhinitis with seasonal variation  Gastroesophageal reflux disease without esophagitis  History of hepatitis C  Hx of colonic polyps  Recovering alcoholic (HCC)  Left shoulder pain - Plan: triamcinolone acetonide (KENALOG-40) injection 40 mg, lidocaine (XYLOCAINE) 2 % (with pres) injection 60 mg  Seborrheic keratosis The left shoulder was prepped sterilely. 40 mg of Kenalog and 3 mL of Xylocaine was injected into the subacromial space without difficulty. He will keep me informed as to the benefit of this. I will add Wellbutrin to his regimen to see if this will help with his psychological symptoms. He is to follow-up with his dermatologist concerning the seborrheic keratosis on his left temporal area for  possible remover. He will continue on his Nasacort for his allergies and his probiotics for his reflux. Will continue be followed by the hepatitis C clinic. He is doing quite well with alcohol avoidance and I see no potential problems in the future.   Physical exam - discussed healthy lifestyle, diet, exercise, preventative care, vaccinations, and addressed their concerns.   Follow-up in several months.

## 2016-07-23 ENCOUNTER — Other Ambulatory Visit: Payer: Self-pay | Admitting: Family Medicine

## 2016-07-23 NOTE — Telephone Encounter (Signed)
Is this okay to refill? 

## 2016-07-24 DIAGNOSIS — R350 Frequency of micturition: Secondary | ICD-10-CM | POA: Diagnosis not present

## 2016-07-24 DIAGNOSIS — Z125 Encounter for screening for malignant neoplasm of prostate: Secondary | ICD-10-CM | POA: Diagnosis not present

## 2016-07-24 DIAGNOSIS — N401 Enlarged prostate with lower urinary tract symptoms: Secondary | ICD-10-CM | POA: Diagnosis not present

## 2016-07-28 DIAGNOSIS — B182 Chronic viral hepatitis C: Secondary | ICD-10-CM | POA: Diagnosis not present

## 2016-07-28 DIAGNOSIS — K740 Hepatic fibrosis: Secondary | ICD-10-CM | POA: Diagnosis not present

## 2016-08-04 DIAGNOSIS — B182 Chronic viral hepatitis C: Secondary | ICD-10-CM | POA: Diagnosis not present

## 2016-08-06 DIAGNOSIS — D225 Melanocytic nevi of trunk: Secondary | ICD-10-CM | POA: Diagnosis not present

## 2016-08-06 DIAGNOSIS — D1801 Hemangioma of skin and subcutaneous tissue: Secondary | ICD-10-CM | POA: Diagnosis not present

## 2016-08-06 DIAGNOSIS — L821 Other seborrheic keratosis: Secondary | ICD-10-CM | POA: Diagnosis not present

## 2016-08-06 DIAGNOSIS — L720 Epidermal cyst: Secondary | ICD-10-CM | POA: Diagnosis not present

## 2016-08-06 DIAGNOSIS — L820 Inflamed seborrheic keratosis: Secondary | ICD-10-CM | POA: Diagnosis not present

## 2016-08-18 ENCOUNTER — Ambulatory Visit (INDEPENDENT_AMBULATORY_CARE_PROVIDER_SITE_OTHER): Payer: PPO | Admitting: Family Medicine

## 2016-08-18 ENCOUNTER — Encounter: Payer: Self-pay | Admitting: Family Medicine

## 2016-08-18 VITALS — BP 130/80 | HR 80 | Temp 97.8°F | Wt 160.0 lb

## 2016-08-18 DIAGNOSIS — J0100 Acute maxillary sinusitis, unspecified: Secondary | ICD-10-CM | POA: Diagnosis not present

## 2016-08-18 DIAGNOSIS — H1031 Unspecified acute conjunctivitis, right eye: Secondary | ICD-10-CM | POA: Diagnosis not present

## 2016-08-18 DIAGNOSIS — F341 Dysthymic disorder: Secondary | ICD-10-CM | POA: Diagnosis not present

## 2016-08-18 MED ORDER — VILAZODONE HCL 10 MG PO TABS
10.0000 mg | ORAL_TABLET | Freq: Every day | ORAL | 0 refills | Status: DC
Start: 2016-08-18 — End: 2016-09-03

## 2016-08-18 MED ORDER — AMOXICILLIN-POT CLAVULANATE 875-125 MG PO TABS: 1.0000 | ORAL_TABLET | Freq: Two times a day (BID) | ORAL | 0 refills | Status: DC

## 2016-08-18 NOTE — Progress Notes (Signed)
   Subjective:    Patient ID: Harold Day, male    DOB: Mar 12, 1951, 65 y.o.   MRN: [id number]  HPI He complains of a one-week history of started with sore throat, slight headache, earache. He also has had some nasal congestion, sneezing, postnasal drainage has become purulent. He also has some sinus pressure. Yesterday he developed redness and purulent drainage from the right eye. He does not smoke. He is also noted decreased sense of smell. He also states that he stopped taking the Wellbutrin stating that it actually made his mood much worse. During the encounter he then mentioned intermittent difficulty with tremor.   Review of Systems     Objective:   Physical Exam Alert and in no distress. Nasal mucosa is normal. Slight tenderness over maxillary sinuses. Right conjunctiva is injected with some purulent drainage noted. Tympanic membranes and canals are normal. Pharyngeal area is normal. Neck is supple without adenopathy or thyromegaly. Cardiac exam shows a regular sinus rhythm without murmurs or gallops. Lungs are clear to auscultation.        Assessment & Plan:  Acute bacterial conjunctivitis of right eye - Plan: amoxicillin-clavulanate (AUGMENTIN) 875-125 MG tablet  Acute non-recurrent maxillary sinusitis - Plan: amoxicillin-clavulanate (AUGMENTIN) 875-125 MG tablet  Dysthymia - Plan: Vilazodone HCl (VIIBRYD) 10 MG TABS He will taper off Celexa going down to 20 mg for 1 week and then 10 mg. He can then start taking the Viibryd. He will see me again in one month but is to call me in 2 weeks. Also recommended when he comes back in a month we will further discuss the tremor

## 2016-08-18 NOTE — Patient Instructions (Signed)
Taper off the  Citalopram by going down to 20 mg for a week and then 10 mg for a week and then stop. Start the Trintellix when you get down to 10 mg. Call me before you run out of the Trintellix to let me know how you're doing

## 2016-08-21 ENCOUNTER — Encounter: Payer: Self-pay | Admitting: Family Medicine

## 2016-09-03 ENCOUNTER — Ambulatory Visit (INDEPENDENT_AMBULATORY_CARE_PROVIDER_SITE_OTHER): Payer: PPO | Admitting: Family Medicine

## 2016-09-03 ENCOUNTER — Encounter: Payer: Self-pay | Admitting: Family Medicine

## 2016-09-03 VITALS — BP 122/80 | HR 78 | Wt 161.0 lb

## 2016-09-03 DIAGNOSIS — F341 Dysthymic disorder: Secondary | ICD-10-CM

## 2016-09-03 MED ORDER — VILAZODONE HCL 20 MG PO TABS: 1.0000 | ORAL_TABLET | Freq: Every day | ORAL | 1 refills | Status: DC

## 2016-09-03 NOTE — Progress Notes (Signed)
   Subjective:    Patient ID: Harold Day, male    DOB: [birth year], 65 y.o.   MRN: [id number]  HPI He is getting ready to go on a trip to [place] and does need paperwork filled out. He is up-to-date on his shots getting his teeth at this year. He also has recently switched his psychotropic medication over to [place] and has noted an improvement in his overall demeanor.   Review of Systems     Objective:   Physical Exam Alert and in no distress with appropriate affect       Assessment & Plan:  Dysthymia - Plan: Vilazodone HCl (VIIBRYD) 20 MG TABS I will check with him in several months. Paperwork was signed. Okay for him to travel to [place].

## 2016-09-22 ENCOUNTER — Encounter: Payer: Self-pay | Admitting: Family Medicine

## 2016-09-22 ENCOUNTER — Ambulatory Visit (INDEPENDENT_AMBULATORY_CARE_PROVIDER_SITE_OTHER): Payer: PPO | Admitting: Family Medicine

## 2016-09-22 VITALS — BP 132/80 | HR 72 | Ht 69.0 in | Wt 168.0 lb

## 2016-09-22 DIAGNOSIS — F341 Dysthymic disorder: Secondary | ICD-10-CM | POA: Diagnosis not present

## 2016-09-22 DIAGNOSIS — G8929 Other chronic pain: Secondary | ICD-10-CM | POA: Diagnosis not present

## 2016-09-22 DIAGNOSIS — R292 Abnormal reflex: Secondary | ICD-10-CM

## 2016-09-22 DIAGNOSIS — R251 Tremor, unspecified: Secondary | ICD-10-CM

## 2016-09-22 DIAGNOSIS — M25512 Pain in left shoulder: Secondary | ICD-10-CM | POA: Diagnosis not present

## 2016-09-22 DIAGNOSIS — R0989 Other specified symptoms and signs involving the circulatory and respiratory systems: Secondary | ICD-10-CM

## 2016-09-22 LAB — COMPREHENSIVE METABOLIC PANEL
ALT: 20 U/L (ref 9–46)
AST: 20 U/L (ref 10–35)
Albumin: 4 g/dL (ref 3.6–5.1)
Alkaline Phosphatase: 47 U/L (ref 40–115)
BUN: 13 mg/dL (ref 7–25)
CHLORIDE: 107 mmol/L (ref 98–110)
CO2: 27 mmol/L (ref 20–31)
Calcium: 9.4 mg/dL (ref 8.6–10.3)
Creat: 1.2 mg/dL (ref 0.70–1.25)
Glucose, Bld: 126 mg/dL — ABNORMAL HIGH (ref 65–99)
Potassium: 4.2 mmol/L (ref 3.5–5.3)
Sodium: 141 mmol/L (ref 135–146)
Total Bilirubin: 0.4 mg/dL (ref 0.2–1.2)
Total Protein: 6.5 g/dL (ref 6.1–8.1)

## 2016-09-22 LAB — CBC WITH DIFFERENTIAL/PLATELET
Basophils Absolute: 82 cells/uL (ref 0–200)
Basophils Relative: 1 %
EOS ABS: 164 {cells}/uL (ref 15–500)
EOS PCT: 2 %
HCT: 40.3 % (ref 38.5–50.0)
Hemoglobin: 13.6 g/dL (ref 13.2–17.1)
LYMPHS PCT: 23 %
Lymphs Abs: 1886 cells/uL (ref 850–3900)
MCH: 31.1 pg (ref 27.0–33.0)
MCHC: 33.7 g/dL (ref 32.0–36.0)
MCV: 92.2 fL (ref 80.0–100.0)
MONOS PCT: 7 %
MPV: 10.6 fL (ref 7.5–12.5)
Monocytes Absolute: 574 cells/uL (ref 200–950)
NEUTROS PCT: 67 %
Neutro Abs: 5494 cells/uL (ref 1500–7800)
PLATELETS: 242 10*3/uL (ref 140–400)
RBC: 4.37 MIL/uL (ref 4.20–5.80)
RDW: 14.1 % (ref 11.0–15.0)
WBC: 8.2 10*3/uL (ref 4.0–10.5)

## 2016-09-22 LAB — TSH: TSH: 1.23 mIU/L (ref 0.40–4.50)

## 2016-09-22 MED ORDER — VENLAFAXINE HCL 37.5 MG PO TABS: 37.5000 mg | ORAL_TABLET | Freq: Two times a day (BID) | ORAL | 0 refills | Status: DC

## 2016-09-22 NOTE — Progress Notes (Addendum)
   Subjective:    Patient ID: Harold Day, male    DOB: 20-Mar-1951, 65 y.o.   MRN: [id number]  HPI He is here for evaluation of several issues. The main one is tremors. He has noticed this for the last 4-5 months. He states that caffeine and decongestants do not play a role. He is noting more difficulty with ADLs specifically drinking coffee and eating. He also notes it tends to be more bothersome when he is under stress. Left hand greater than right. He is left-handed. He does note that this occurred around the same time that he was able to hear his heart beating in his ears. He also complains of 2 episodes of spontaneously becoming dizzy with blurred vision that lasted a minute or 2. He noted no heart rate changes. He also states that he continues to have left shoulder pain. The 2 injections did very little to help with his symptoms. He notes pain with any activity and also at night trying to sleep. He states that the psychotropic have him on his causing problems with wearing off later in the day and it is expensive. He like to be switched to something different.   Review of Systems     Objective:   Physical Exam Alert and in no distress. TMs clear. Throat clear. Neck supple without adenopathy left carotid bruit is noted. No bruit is heard on the right. DTRs are 3+. Slight tremor of the hands is also noted. It is not made worse with purposeful motion Cardiac and lung exam normal. Left shoulder exam shows good motion. Drop arm test negative. Supraspinatus testing was uncomfortable. Speed's and O'Brien test caused discomfort       Assessment & Plan:  Bruit of left carotid artery - Plan: US Carotid Duplex Bilateral  Tremor of both hands - Plan: CBC with Differential/Platelet, Comprehensive metabolic panel, TSH  Chronic left shoulder pain - Plan: DG Shoulder Left  Dysthymia - Plan: venlafaxine (EFFEXOR) 37.5 MG tablet, CBC with Differential/Platelet, Comprehensive metabolic panel,  TSH  Hyperreflexia - Plan: CBC with Differential/Platelet, Comprehensive metabolic panel, TSH He is to taper off the Viibryd by cutting his dosing in half for the next week to 2 and then stop. I will add Effexor 37.5mg  as a day for 3 days and then to twice a day. He'll then call me at the end of the dose to increase this. He will be sent for shoulder x-ray and then probably for further evaluation and ultrasound since it seems to be probably a labral issue more than anything else. Dopplers ordered also as well. His hyperreflexia is a concern and further neurologic evaluation may be needed. Over 45 minutes, greater than 50% spent in counseling and coordination of care.

## 2016-09-22 NOTE — Patient Instructions (Signed)
Take half of the Viibrydfor the next week then stop. Go ahead and start the new medication

## 2016-09-23 ENCOUNTER — Other Ambulatory Visit: Payer: Self-pay

## 2016-09-23 DIAGNOSIS — R251 Tremor, unspecified: Secondary | ICD-10-CM

## 2016-09-23 DIAGNOSIS — R292 Abnormal reflex: Secondary | ICD-10-CM

## 2016-09-24 NOTE — Progress Notes (Signed)
Subjective:   Harold Day was seen in consultation in the movement disorder clinic at the request of Wyatt Haste, MD.  The evaluation is for tremor and hyperreflexia.  Tremor started approximately 4-5 months ago and involves the bilateral UE.  Tremor is most noticeable when he eats or is in social situations.  He never notes it at rest.   There is no family hx of tremor.  Neuroimaging of the brain has not previously been performed  Affected by caffeine:  No. (2 small cups per day) Affected by alcohol:  Doesn't drink (hx of EtOHism) Affected by stress:  Doesn't have lots of stress but thinks so Affected by fatigue:  Yes.   Spills soup if on spoon:  May or may not Spills glass of liquid if full:  No. Affects ADL's (tying shoes, brushing teeth, etc):  No.  Current/Previously tried tremor medications: none  Current medications that may exacerbate tremor:  N/a (just added Viibryd but tremor was there first)  Outside reports reviewed: lab reports, office notes and referral letter/letters.  C/o spells where "loses vision" and becomes blurry.  If he closes an eye it is better.  It only lasts minute or two.  He feels near syncopal but no palpitations.  No curtain over eye.  No lateralizing weakness/paresthesia.    No Known Allergies  Outpatient Encounter Prescriptions as of 09/25/2016  Medication Sig  . aspirin 81 MG tablet Take 81 mg by mouth daily.  Marland Kitchen esomeprazole (NEXIUM) 20 MG capsule Take 20 mg by mouth daily at 12 noon.  . finasteride (PROSCAR) 5 MG tablet Take 1 tablet (5 mg total) by mouth daily.  . fish oil-omega-3 fatty acids 1000 MG capsule Take 2 g by mouth daily.  Marland Kitchen ibuprofen (ADVIL,MOTRIN) 200 MG tablet Take 600 mg by mouth every 4 (four) hours.  . Multiple Vitamins-Minerals (MULTIVITAMIN WITH MINERALS) tablet Take 1 tablet by mouth daily.  . Probiotic Product (PROBIOTIC-10 PO) Take by mouth.  . tamsulosin (FLOMAX) 0.4 MG CAPS capsule Take 0.4 mg by mouth.  .  triamcinolone (NASACORT AQ) 55 MCG/ACT nasal inhaler Place 2 sprays into the nose daily.  Marland Kitchen venlafaxine (EFFEXOR) 37.5 MG tablet Take 1 tablet (37.5 mg total) by mouth 2 (two) times daily.  . Vilazodone HCl (VIIBRYD) 20 MG TABS Take 1 tablet (20 mg total) by mouth daily.   No facility-administered encounter medications on file as of 09/25/2016.     Past Medical History:  Diagnosis Date  . Alcoholic ([place])   . Allergic rhinitis   . Depression   . Former smoker    quit 2011  . GERD (gastroesophageal reflux disease)   . Hepatitis C    noticed when he tried to give blood; never had illness    Past Surgical History:  Procedure Laterality Date  . [place] STUDY N/A 09/18/2014   Procedure: [place] STUDY;  Surgeon: Beryle Beams, MD;  Location: [facility] ENDOSCOPY;  Service: Endoscopy;  Laterality: N/A;  . COLONOSCOPY    . ESOPHAGEAL MANOMETRY N/A 09/18/2014   Procedure: ESOPHAGEAL MANOMETRY (EM);  Surgeon: Beryle Beams, MD;  Location: [facility] ENDOSCOPY;  Service: Endoscopy;  Laterality: N/A;  . VASECTOMY      Social History   Social History  . Marital status: Married    Spouse name: N/A  . Number of children: 2  . Years of education: N/A   Occupational History  . retired (tobacco buyer) [employer]   Social History Main  Topics  . Smoking status: Former Smoker    Quit date: 07/05/2011  . Smokeless tobacco: Current User    Types: Snuff  . Alcohol use No     Comment: no alcohol since about 2000  . Drug use: No  . Sexual activity: Not Currently   Other Topics Concern  . Not on file   Social History Narrative  . No narrative on file    Family Status  Relation Status  . Mother Deceased  . Father Deceased  . Sister Alive  . Son Alive    Review of Systems A complete 10 system ROS was obtained and was negative apart from what is mentioned.   Objective:   VITALS:   Vitals:   09/25/16 0927  BP: 120/80  Pulse: 87  Weight: 165 lb (74.8 kg)  Height: 5\' 8"   (1.727 m)   Gen:  Appears stated age and in NAD. HEENT:  Normocephalic, atraumatic. The mucous membranes are moist. The superficial temporal arteries are without ropiness or tenderness. Cardiovascular: Regular rate and rhythm. Lungs: Clear to auscultation bilaterally. Neck: There is a L carotid bruit  NEUROLOGICAL:  Orientation:  The patient is alert and oriented x 3.  Recent and remote memory are intact.  Attention span and concentration are normal.  Able to name objects and repeat without trouble.  Fund of knowledge is appropriate Cranial nerves: There is good facial symmetry. The pupils are equal round and reactive to light bilaterally. Fundoscopic exam reveals clear disc margins bilaterally. Extraocular muscles are intact and visual fields are full to confrontational testing. Speech is fluent and clear. Soft palate rises symmetrically and there is no tongue deviation. Hearing is intact to conversational tone. Tone: Tone is good throughout. Sensation: Sensation is intact to light touch and pinprick throughout (facial, trunk, extremities). Vibration is intact at the bilateral big toe. There is no extinction with double simultaneous stimulation. There is no sensory dermatomal level identified. Coordination:  The patient has no dysdiadichokinesia or dysmetria. Motor: Strength is 5/5 in the bilateral upper and lower extremities.  Shoulder shrug is equal bilaterally.  There is no pronator drift.  There are no fasciculations noted. DTR's: Deep tendon reflexes are 2+-3-/4 at the bilateral biceps, triceps, brachioradialis, patella and achilles.  Plantar responses are downgoing bilaterally. Gait and Station: The patient is able to ambulate without difficulty. The patient is able to heel toe walk without any difficulty. The patient is able to ambulate in a tandem fashion. The patient is able to stand in the Romberg position.   MOVEMENT EXAM: Tremor:  There is mild tremor in the UE, noted most  significantly with action.  There is almost nothing in the left hand, but mild tremor is noted in the right hand.  There is mild tremor with Archimedes spirals.  There is no tremor at rest.  The patient is able to pour water from one glass to another without spilling it.  LABS:  Lab Results  Component Value Date   TSH 1.23 09/22/2016     Chemistry      Component Value Date/Time   NA 141 09/22/2016 0820   K 4.2 09/22/2016 0820   CL 107 09/22/2016 0820   CO2 27 09/22/2016 0820   BUN 13 09/22/2016 0820   CREATININE 1.20 09/22/2016 0820      Component Value Date/Time   CALCIUM 9.4 09/22/2016 0820   ALKPHOS 47 09/22/2016 0820   AST 20 09/22/2016 0820   ALT 20 09/22/2016 0820   BILITOT  0.4 09/22/2016 0820     No results found for: HGBA1C  No results found for: VITAMINB12      Assessment/Plan:   1.  Essential Tremor.  -This is Likely essential tremor.  I see no evidence of any degenerative process.  I see nothing atypical on his examination.  He wants medication, but only on an as-needed basis.  The biggest issue he has is that it seems to affect his golf game.  We decided to try propranolol on a prn basis, but I asked him to try it at home first to make sure that he tolerates the medication.  We will start at 20 mg and he is to call me after he tries it.  I will call him in the next 6 weeks and see how he does.  -He was offered neuroimaging, but we decided to hold on that since his examination was essentially nonfocal and nonlateralizing.  2.  L carotid bruit  -I was going to order a carotid ultrasound, but when I started to do that, I saw that his primary care physician has already done this for the same medication.  It is scheduled for Monday.  3.  Near syncopal episodes  -If no answers are given on the ultrasound, and he needs to follow-up with his primary care physician to discuss these episodes as these do not sound primary neurologic in nature.  4.  Hyperreflexia  -denies  neck/back pain.  -suspect just physiologic hyperreflexia as is only 2+-3- and no abnormal reflexes.  5.  F/u 1 year, sooner should new issues arise.  Will call me should any new neuro sx's arise, esp if lateralizing.   Much greater than 50% of this visit was spent in counseling and coordinating care.  Total face to face time:  45 min   CC:  Wyatt Haste, MD

## 2016-09-25 ENCOUNTER — Encounter: Payer: Self-pay | Admitting: Neurology

## 2016-09-25 ENCOUNTER — Ambulatory Visit (INDEPENDENT_AMBULATORY_CARE_PROVIDER_SITE_OTHER): Payer: PPO | Admitting: Neurology

## 2016-09-25 VITALS — BP 120/80 | HR 87 | Ht 68.0 in | Wt 165.0 lb

## 2016-09-25 DIAGNOSIS — G250 Essential tremor: Secondary | ICD-10-CM

## 2016-09-25 DIAGNOSIS — R0989 Other specified symptoms and signs involving the circulatory and respiratory systems: Secondary | ICD-10-CM | POA: Diagnosis not present

## 2016-09-25 MED ORDER — PROPRANOLOL HCL 20 MG PO TABS: 20.0000 mg | ORAL_TABLET | ORAL | 1 refills | Status: DC | PRN

## 2016-09-25 NOTE — Patient Instructions (Signed)
Propranolol 20 mg sent to your pharmacy. You can take this medication as needed. Follow up in one year.

## 2016-09-29 ENCOUNTER — Ambulatory Visit
Admission: RE | Admit: 2016-09-29 | Discharge: 2016-09-29 | Disposition: A | Discharge status: Home / Self Care | Payer: PPO | Source: Ambulatory Visit | Attending: Family Medicine | Admitting: Family Medicine

## 2016-09-29 DIAGNOSIS — G8929 Other chronic pain: Secondary | ICD-10-CM

## 2016-09-29 DIAGNOSIS — M19012 Primary osteoarthritis, left shoulder: Secondary | ICD-10-CM | POA: Diagnosis not present

## 2016-09-29 DIAGNOSIS — I6523 Occlusion and stenosis of bilateral carotid arteries: Secondary | ICD-10-CM | POA: Diagnosis not present

## 2016-09-29 DIAGNOSIS — M25512 Pain in left shoulder: Principal | ICD-10-CM

## 2016-09-29 DIAGNOSIS — R0989 Other specified symptoms and signs involving the circulatory and respiratory systems: Secondary | ICD-10-CM

## 2016-10-11 ENCOUNTER — Other Ambulatory Visit: Payer: Self-pay | Admitting: Family Medicine

## 2016-10-11 DIAGNOSIS — F341 Dysthymic disorder: Secondary | ICD-10-CM

## 2016-10-14 MED ORDER — VENLAFAXINE HCL 75 MG PO TABS: 75.0000 mg | ORAL_TABLET | Freq: Two times a day (BID) | ORAL | 1 refills | Status: DC

## 2016-10-15 NOTE — Progress Notes (Signed)
[facility] [address] [place], [place] [address] Phone: [phone] Subjective:    I'm seeing this patient by the request  of:  Wyatt Haste, MD   CC: Left shoulder pain  [id number]  Harold Day is a 65 y.o. male coming in with complaint of left shoulder pain. Has been going on for multiple months. Since then it seems to be worsening. Did see primary care provider and has had 2 different injections. Last one was multiple months ago. Patient though feels that unfortunately it seems to be worsening. Mild radiation down the arm. Denies any weakness. Denies any nighttime awakening but any type of movement seems to give him trouble. Things such as driving or golf causes severe sharp pain. Rates the severity of pain a 7 out of 10. Not responding over-the-counter medications.    Patient did have x-rays of left shoulder taken 09/29/2016. Patient did have x-rays showing moderate arthritic changes in the acromioclavicular joint and glenohumeral joint appeared to be normal. Otherwise unremarkable. This was independently visualized by me. Past Medical History:  Diagnosis Date  . Alcoholic ([place])   . Allergic rhinitis   . Depression   . Former smoker    quit 2011  . GERD (gastroesophageal reflux disease)   . Hepatitis C    noticed when he tried to give blood; never had illness   Past Surgical History:  Procedure Laterality Date  . [place] STUDY N/A 09/18/2014   Procedure: [place] STUDY;  Surgeon: Beryle Beams, MD;  Location: [facility];  Service: Endoscopy;  Laterality: N/A;  . COLONOSCOPY    . ESOPHAGEAL MANOMETRY N/A 09/18/2014   Procedure: ESOPHAGEAL MANOMETRY (EM);  Surgeon: Beryle Beams, MD;  Location: [facility];  Service: Endoscopy;  Laterality: N/A;  . VASECTOMY     Social History   Social History  . Marital status: Married    Spouse name: N/A  . Number of children: 2  . Years of education: N/A   Occupational History   . retired (tobacco buyer) [employer]   Social History Main Topics  . Smoking status: Former Smoker    Quit date: 07/05/2011  . Smokeless tobacco: Current User    Types: Snuff  . Alcohol use No     Comment: no alcohol since about 2000  . Drug use: No  . Sexual activity: Not Currently   Other Topics Concern  . None   Social History Narrative  . None   No Known Allergies Family History  Problem Relation Age of Onset  . Lung cancer Mother   . Autism Son     Past medical history, social, surgical and family history all reviewed in electronic medical record.  No pertanent information unless stated regarding to the chief complaint.   Review of Systems:Review of systems updated and as accurate as of 10/16/16  No headache, visual changes, nausea, vomiting, diarrhea, constipation, dizziness, abdominal pain, skin rash, fevers, chills, night sweats, weight loss, swollen lymph nodes, body aches, joint swelling, muscle aches, chest pain, shortness of breath, mood changes.   Objective  Blood pressure 134/74, pulse 96, height 5\' 9"  (1.753 m), weight 164 lb (74.4 kg), SpO2 93 %. Systems examined below as of 10/16/16   General: No apparent distress alert and oriented x3 mood and affect normal, dressed appropriately.  HEENT: Pupils equal, extraocular movements intact  Respiratory: Patient's speak in full sentences and does not appear short of breath  Cardiovascular: No  lower extremity edema, non tender, no erythema  Skin: Warm dry intact with no signs of infection or rash on extremities or on axial skeleton.  Abdomen: Soft nontender  Neuro: Cranial nerves II through XII are intact, neurovascularly intact in all extremities with 2+ DTRs and 2+ pulses.  Lymph: No lymphadenopathy of posterior or anterior cervical chain or axillae bilaterally.  Gait normal with good balance and coordination.  MSK:  Non tender with full range of motion and good stability and symmetric strength and  tone of  elbows, wrist, hip, knee and ankles bilaterally.  Shoulder: left Inspection reveals no abnormalities, atrophy or asymmetry. Palpation is normal with no tenderness over AC joint or bicipital groove. ROM is full in all planes passively. Rotator cuff strength normal throughout. signs of impingement with positive Neer and Hawkin's tests, but negative empty can sign. Speeds and Yergason's tests normal. No labral pathology noted with negative Obrien's, negative clunk and good stability. Normal scapular function observed. No painful arc and no drop arm sign.Positive crossover sign No apprehension sign  MSK US performed of: left This study was ordered, performed, and interpreted by Charlann Boxer D.O.  Shoulder:   Supraspinatus:  Appears normal on long and transverse views, Bursal bulge seen with shoulder abduction on impingement view. Infraspinatus:  Appears normal on long and transverse views. Significant increase in Doppler flow Subscapularis:  Appears normal on long and transverse views. Positive bursa Teres Minor:  Appears normal on long and transverse views. 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 Glenoid Labrum:  Mild degenerative tearing noted Biceps Tendon:  Mild chronic subluxation  Impression: Subacromial bursitis coming clavicular arthritis  Procedure: Real-time Ultrasound Guided Injection of left glenohumeral joint Device: GE Logiq E  Ultrasound guided injection is preferred based studies that show increased duration, increased effect, greater accuracy, decreased procedural pain, increased response rate with ultrasound guided versus blind injection.  Verbal informed consent obtained.  Time-out conducted.  Noted no overlying erythema, induration, or other signs of local infection.  Skin prepped in a sterile fashion.  Local anesthesia: Topical Ethyl chloride.  With sterile technique and under  real time ultrasound guidance:  Joint visualized.  23g 1  inch needle inserted posterior approach. Pictures taken for needle placement. Patient did have injection of 2 cc of 1% lidocaine, 2 cc of 0.5% Marcaine, and 1.0 cc of Kenalog 40 mg/dL. Completed without difficulty  Pain immediately resolved suggesting accurate placement of the medication.  Advised to call if fevers/chills, erythema, induration, drainage, or persistent bleeding.  Images permanently stored and available for review in the ultrasound unit.  Impression: Technically successful ultrasound guided injection.  Procedure: Real-time Ultrasound Guided Injection of left acromial clavicular joint Device: GE Logiq E  Ultrasound guided injection is preferred based studies that show increased duration, increased effect, greater accuracy, decreased procedural pain, increased response rate, and decreased cost with ultrasound guided versus blind injection.  Verbal informed consent obtained.  Time-out conducted.  Noted no overlying erythema, induration, or other signs of local infection.  Skin prepped in a sterile fashion.  Local anesthesia: Topical Ethyl chloride.  With sterile technique and under real time ultrasound guidance:  With a 25-gauge half-inch needle patient was injected with a total of 0.5 mL of 0.5% Marcaine and 0.5 mL of Kenalog 41 g/dL. Completed without difficulty  Pain immediately resolved suggesting accurate placement of the medication.  Advised to call if fevers/chills, erythema, induration, drainage, or persistent bleeding.  Images permanently stored and available for review in the ultrasound unit.  Impression: Technically successful ultrasound guided injection.   Impression and Recommendations:     This case required medical decision making of moderate complexity.      Note: This dictation was prepared with Dragon dictation along with smaller phrase technology. Any transcriptional errors that result from this  process are unintentional.

## 2016-10-16 ENCOUNTER — Ambulatory Visit (INDEPENDENT_AMBULATORY_CARE_PROVIDER_SITE_OTHER): Payer: PPO | Admitting: Family Medicine

## 2016-10-16 ENCOUNTER — Ambulatory Visit: Payer: Self-pay

## 2016-10-16 ENCOUNTER — Encounter: Payer: Self-pay | Admitting: Family Medicine

## 2016-10-16 VITALS — BP 134/74 | HR 96 | Ht 69.0 in | Wt 164.0 lb

## 2016-10-16 DIAGNOSIS — M7552 Bursitis of left shoulder: Secondary | ICD-10-CM | POA: Insufficient documentation

## 2016-10-16 DIAGNOSIS — M25512 Pain in left shoulder: Secondary | ICD-10-CM | POA: Diagnosis not present

## 2016-10-16 DIAGNOSIS — M19019 Primary osteoarthritis, unspecified shoulder: Secondary | ICD-10-CM | POA: Diagnosis not present

## 2016-10-16 NOTE — Patient Instructions (Addendum)
Good to see you.  Ice 20 minutes 2 times daily. Usually after activity and before bed. Exercises 3 times a week.  Avoid activity where your hands are outside peripheral vision.  Drop weight to 50% and increase 10% a week.  Vitamin D 2000 IU daily could help with strength and muscle endurance.  See me again in 4 weeks to make sure you are doing better Happy New Year!

## 2016-10-16 NOTE — Assessment & Plan Note (Addendum)
Patient given injection today and tolerated the procedure well. We discussed which activities to avoid. We discussed potential topical anti-inflammatories and over-the-counter medications. Encourage him to continue to be active but changed certain lifting mechanics. Follow-up again in 4 weeks  patient is responding well.

## 2016-10-24 ENCOUNTER — Telehealth: Payer: Self-pay | Admitting: Neurology

## 2016-10-24 NOTE — Telephone Encounter (Signed)
Called patient to see how he is doing on Propranolol PRN. He states he has not had to use it much, but when he has it has helped. Will call with any problems or concerns.

## 2016-11-13 ENCOUNTER — Encounter: Payer: Self-pay | Admitting: Family Medicine

## 2016-11-13 ENCOUNTER — Ambulatory Visit (INDEPENDENT_AMBULATORY_CARE_PROVIDER_SITE_OTHER): Payer: PPO | Admitting: Family Medicine

## 2016-11-13 DIAGNOSIS — S43432A Superior glenoid labrum lesion of left shoulder, initial encounter: Secondary | ICD-10-CM

## 2016-11-13 DIAGNOSIS — S43439A Superior glenoid labrum lesion of unspecified shoulder, initial encounter: Secondary | ICD-10-CM | POA: Insufficient documentation

## 2016-11-13 MED ORDER — NITROGLYCERIN 0.2 MG/HR TD PT24: MEDICATED_PATCH | TRANSDERMAL | 1 refills | Status: DC

## 2016-11-13 NOTE — Progress Notes (Signed)
[facility] [place] [place], [place] [address] Phone: [phone] Subjective:     CC: Left shoulder pain f/u  [id number]  Harold Day is a 66 y.o. male coming in with complaint of left shoulder pain. Patient was seen previously and was diagnosed with more of a subacromial bursitis as well as acromioclavicular arthritis. Patient was given injections in both of these one month ago. Patient was to do home exercise, icing protocol, and start increasing activity. Patient states no significant improvement. Patient states he continues to have a dull, throbbing aching pain with a sharp pain with certain movements though. Patient states it is still seems to be localized mostly to the shoulder but can be anterior as well as posterior. No significant radiation down. Denies any weakness. Sometimes severe enough to catch his breath.    Patient did have x-rays of left shoulder taken 09/29/2016. Patient did have x-rays showing moderate arthritic changes in the acromioclavicular joint and glenohumeral joint appeared to be normal. Otherwise unremarkable. This was independently visualized by me again today  Past Medical History:  Diagnosis Date  . Alcoholic ([place])   . Allergic rhinitis   . Depression   . Former smoker    quit 2011  . GERD (gastroesophageal reflux disease)   . Hepatitis C    noticed when he tried to give blood; never had illness   Past Surgical History:  Procedure Laterality Date  . [place] STUDY N/A 09/18/2014   Procedure: [place] STUDY;  Surgeon: Beryle Beams, MD;  Location: [facility];  Service: Endoscopy;  Laterality: N/A;  . COLONOSCOPY    . ESOPHAGEAL MANOMETRY N/A 09/18/2014   Procedure: ESOPHAGEAL MANOMETRY (EM);  Surgeon: Beryle Beams, MD;  Location: [facility];  Service: Endoscopy;  Laterality: N/A;  . VASECTOMY     Social History   Social History  . Marital status: Married    Spouse name: N/A  . Number of  children: 2  . Years of education: N/A   Occupational History  . retired (tobacco buyer) [employer]   Social History Main Topics  . Smoking status: Former Smoker    Quit date: 07/05/2011  . Smokeless tobacco: Current User    Types: Snuff  . Alcohol use No     Comment: no alcohol since about 2000  . Drug use: No  . Sexual activity: Not Currently   Other Topics Concern  . None   Social History Narrative  . None   No Known Allergies Family History  Problem Relation Age of Onset  . Lung cancer Mother   . Autism Son     Past medical history, social, surgical and family history all reviewed in electronic medical record.  No pertanent information unless stated regarding to the chief complaint.   Review of Systems: No headache, visual changes, nausea, vomiting, diarrhea, constipation, dizziness, abdominal pain, skin rash, fevers, chills, night sweats, weight loss, swollen lymph nodes, body aches, joint swelling, chest pain, shortness of breath, mood changes.    Objective  Blood pressure 130/84, pulse 64, height 5\' 9"  (1.753 m), weight 164 lb (74.4 kg), SpO2 100 %.   Systems examined below as of 11/13/16 General: NAD A&O x3 mood, affect normal  HEENT: Pupils equal, extraocular movements intact no nystagmus Respiratory: not short of breath at rest or with speaking Cardiovascular: No lower extremity edema, non tender Skin: Warm dry intact with no signs of infection or rash on extremities  or on axial skeleton. Abdomen: Soft nontender, no masses Neuro: Cranial nerves  intact, neurovascularly intact in all extremities with 2+ DTRs and 2+ pulses. Lymph: No lymphadenopathy appreciated today  Gait normal with good balance and coordination.  MSK: Non tender with full range of motion and good stability and symmetric strength and tone of  elbows, wrist,  knee hips and ankles bilaterally.   Shoulder: Left Inspection reveals no abnormalities, atrophy or asymmetry. Palpation  is normal with no tenderness over AC joint or bicipital groove. ROM is full in all planes. Rotator cuff strength normal throughout. Positive impingement signs. Audible popping with abduction. Speeds and Yergason's tests normal. Positive O'Brien's as well as positive crossover. Positive grind test. Normal scapular function observed. No painful arc and no drop arm sign. No apprehension sign Contralateral shoulder unremarkable.        Impression and Recommendations:     This case required medical decision making of moderate complexity.      Note: This dictation was prepared with Dragon dictation along with smaller phrase technology. Any transcriptional errors that result from this process are unintentional.

## 2016-11-13 NOTE — Assessment & Plan Note (Signed)
Worsening symptoms at this time. Did not respond to the injections previously for the subacromial bursitis and the acromial clavicular arthritis that was seen previously. Likely patient does have more of a labral tear with findings today. Discussed with patient at great length about different treatment options. Patient has elected try and nitroglycerin. Declined another repeat of formal physical therapy. Encourage patient to do home exercises regularly. We discussed icing regimen. Discussed which activities to do a which was to avoid. Patient will continue to stay active. Patient will follow-up again in 6 weeks. At that time if worsening symptoms we'll consider an injection again. Significant worsening symptoms we'll need to consider advanced imaging.  Spent  25 minutes with patient face-to-face and had greater than 50% of counseling including as described above in assessment and plan.

## 2016-11-13 NOTE — Patient Instructions (Addendum)
Good to see you  We will try nitro Conitnue the exercises 2-3 times a week  Likely a labral tear See me again in 6 weeks and if not better we will try anther injection.  Nitroglycerin Protocol   Apply 1/4 nitroglycerin patch to affected area daily.  Change position of patch within the affected area every 24 hours.  You may experience a headache during the first 1-2 weeks of using the patch, these should subside.  If you experience headaches after beginning nitroglycerin patch treatment, you may take your preferred over the counter pain reliever.  Another side effect of the nitroglycerin patch is skin irritation or rash related to patch adhesive.  Please notify our office if you develop more severe headaches or rash, and stop the patch.  Tendon healing with nitroglycerin patch may require 12 to 24 weeks depending on the extent of injury.  Men should not use if taking Viagra, Cialis, or Levitra.   Do not use if you have migraines or rosacea.

## 2016-11-25 ENCOUNTER — Ambulatory Visit (INDEPENDENT_AMBULATORY_CARE_PROVIDER_SITE_OTHER): Payer: PPO | Admitting: Family Medicine

## 2016-11-25 ENCOUNTER — Encounter: Payer: Self-pay | Admitting: Family Medicine

## 2016-11-25 VITALS — BP 132/82 | HR 70 | Ht 69.0 in | Wt 164.0 lb

## 2016-11-25 DIAGNOSIS — S43432D Superior glenoid labrum lesion of left shoulder, subsequent encounter: Secondary | ICD-10-CM

## 2016-11-25 DIAGNOSIS — M25512 Pain in left shoulder: Secondary | ICD-10-CM

## 2016-11-25 DIAGNOSIS — G8929 Other chronic pain: Secondary | ICD-10-CM | POA: Diagnosis not present

## 2016-11-25 NOTE — Progress Notes (Signed)
[facility] [address] [place], [place] [address] Phone: [phone] Subjective:     CC: Left shoulder pain f/u  [id number]  Harold Day is a 66 y.o. male coming in with complaint of left shoulder pain. Patient was seen previously and was diagnosed with more of a subacromial bursitis as well as acromioclavicular arthritis. Patient was given injections in both In December. Patient was having unfortunately worsening symptoms again more of the shoulder. Not as much of the acromial clavicular joint. States it is waking up at night affecting daily activities. Sometimes feels like he has weakness.    Patient did have x-rays of left shoulder taken 09/29/2016. Patient did have x-rays showing moderate arthritic changes in the acromioclavicular joint and glenohumeral joint appeared to be normal. Otherwise unremarkable. This was independently visualized by me again today  Past Medical History:  Diagnosis Date  . Alcoholic ([place])   . Allergic rhinitis   . Depression   . Former smoker    quit 2011  . GERD (gastroesophageal reflux disease)   . Hepatitis C    noticed when he tried to give blood; never had illness   Past Surgical History:  Procedure Laterality Date  . [place] STUDY N/A 09/18/2014   Procedure: [place] STUDY;  Surgeon: Beryle Beams, MD;  Location: [facility];  Service: Endoscopy;  Laterality: N/A;  . COLONOSCOPY    . ESOPHAGEAL MANOMETRY N/A 09/18/2014   Procedure: ESOPHAGEAL MANOMETRY (EM);  Surgeon: Beryle Beams, MD;  Location: [facility];  Service: Endoscopy;  Laterality: N/A;  . VASECTOMY     Social History   Social History  . Marital status: Married    Spouse name: N/A  . Number of children: 2  . Years of education: N/A   Occupational History  . retired (tobacco buyer) [employer]   Social History Main Topics  . Smoking status: Former Smoker    Quit date: 07/05/2011  . Smokeless tobacco: Current User      Types: Snuff  . Alcohol use No     Comment: no alcohol since about 2000  . Drug use: No  . Sexual activity: Not Currently   Other Topics Concern  . None   Social History Narrative  . None   No Known Allergies Family History  Problem Relation Age of Onset  . Lung cancer Mother   . Autism Son     Past medical history, social, surgical and family history all reviewed in electronic medical record.  No pertanent information unless stated regarding to the chief complaint.   Review of Systems: No headache, visual changes, nausea, vomiting, diarrhea, constipation, dizziness, abdominal pain, skin rash, fevers, chills, night sweats, weight loss, swollen lymph nodes, body aches,, chest pain, shortness of breath, mood changes.    Objective  Blood pressure 132/82, pulse 70, height 5\' 9"  (1.753 m), weight 164 lb (74.4 kg), SpO2 98 %.   Systems examined below as of 11/25/16 General: NAD A&O x3 mood, affect normal  HEENT: Pupils equal, extraocular movements intact no nystagmus Respiratory: not short of breath at rest or with speaking Cardiovascular: No lower extremity edema, non tender Skin: Warm dry intact with no signs of infection or rash on extremities or on axial skeleton. Abdomen: Soft nontender, no masses Neuro: Cranial nerves  intact, neurovascularly intact in all extremities with 2+ DTRs and 2+ pulses. Lymph: No lymphadenopathy appreciated today  Gait normal with good balance and coordination.  MSK:  Non tender with full range of motion and good stability and symmetric strength and tone of  elbows, wrist,  knee hips and ankles bilaterally.    Shoulder:left Inspection reveals no abnormalities, atrophy or asymmetry. Palpation is normal with no tenderness over AC joint or bicipital groove. ROM is full in all planes.  4 out of 5 strength on the left Perative impingement Speeds and Yergason's tests normal. Positive O'Brien's Normal scapular function observed.  Mild painful arc  which is new Negative apprehension sign  contralateral shoulder unremarkable           Impression and Recommendations:     This case required medical decision making of moderate complexity.      Note: This dictation was prepared with Dragon dictation along with smaller phrase technology. Any transcriptional errors that result from this process are unintentional.

## 2016-11-25 NOTE — Assessment & Plan Note (Signed)
Worsening symptoms. Concerned the patient does have more of a labral tear on the left shoulder. Further evaluation with MRI arthrogram ordered today. We discussed icing regimen and home exercises. We discussed which activities to do a which was to avoid. Patient will stop the nitroglycerin with no significant benefit. Patient's the pending on the findings would likely need surgical intervention and patient is willing to do this at this time.

## 2016-11-25 NOTE — Patient Instructions (Signed)
Good to see you  Harold Day is your friend.  We will get MR- arthrogram and see what is going on.  I will release it in my chart and tell you next step

## 2016-12-06 ENCOUNTER — Other Ambulatory Visit: Payer: Self-pay | Admitting: Family Medicine

## 2016-12-08 NOTE — Telephone Encounter (Signed)
Is this okay to refill? 

## 2016-12-17 ENCOUNTER — Ambulatory Visit
Admission: RE | Admit: 2016-12-17 | Discharge: 2016-12-17 | Disposition: A | Discharge status: Home / Self Care | Payer: PPO | Source: Ambulatory Visit | Attending: Family Medicine | Admitting: Family Medicine

## 2016-12-17 DIAGNOSIS — M25512 Pain in left shoulder: Principal | ICD-10-CM

## 2016-12-17 DIAGNOSIS — G8929 Other chronic pain: Secondary | ICD-10-CM

## 2016-12-17 DIAGNOSIS — M25511 Pain in right shoulder: Secondary | ICD-10-CM | POA: Diagnosis not present

## 2016-12-17 DIAGNOSIS — S43431A Superior glenoid labrum lesion of right shoulder, initial encounter: Secondary | ICD-10-CM | POA: Diagnosis not present

## 2016-12-17 MED ORDER — IOPAMIDOL (ISOVUE-M 200) INJECTION 41%
12.0000 mL | Freq: Once | INTRAMUSCULAR | Status: AC
  Administered 2016-12-17: 12 mL via INTRA_ARTICULAR

## 2016-12-25 ENCOUNTER — Encounter: Payer: Self-pay | Admitting: Family Medicine

## 2016-12-25 ENCOUNTER — Ambulatory Visit (INDEPENDENT_AMBULATORY_CARE_PROVIDER_SITE_OTHER): Payer: PPO | Admitting: Family Medicine

## 2016-12-25 VITALS — BP 122/84 | HR 70 | Ht 69.0 in

## 2016-12-25 DIAGNOSIS — S43431A Superior glenoid labrum lesion of right shoulder, initial encounter: Secondary | ICD-10-CM

## 2016-12-25 DIAGNOSIS — M19019 Primary osteoarthritis, unspecified shoulder: Secondary | ICD-10-CM

## 2016-12-25 NOTE — Progress Notes (Signed)
[facility] [address] [place], [place] [address] Phone: [phone] Subjective:     CC: Left shoulder pain f/u  QBH:[id number]  Harold Day is a 66 y.o. male coming in with complaint of left shoulder pain. Patient was seen previously and was diagnosed with more of a subacromial bursitis as well as acromioclavicular arthritis. Patient continued have pain. Was sent for an MRI. MRI showed the patient did have a partial-thickness tear of the supraspinatus with very mild overall. Mild tendonosis as well as a small tear of the superior labrum.   Patient states the pain is not better, affecting daily activities. Patient states it is waking him up at night. Any type of activity that makes him do any type of lifting more than 10 pounds or certain movements has a severe amount of pain. Pain seems to get better in the morning       Patient did have x-rays of left shoulder taken 09/29/2016. Patient did have x-rays showing moderate arthritic changes in the acromioclavicular joint and glenohumeral joint appeared to be normal. Past Medical History:  Diagnosis Date  . Alcoholic ([place])   . Allergic rhinitis   . Depression   . Former smoker    quit 2011  . GERD (gastroesophageal reflux disease)   . Hepatitis C    noticed when he tried to give blood; never had illness   Past Surgical History:  Procedure Laterality Date  . [place] STUDY N/A 09/18/2014   Procedure: [place] STUDY;  Surgeon: Beryle Beams, MD;  Location: [facility];  Service: Endoscopy;  Laterality: N/A;  . COLONOSCOPY    . ESOPHAGEAL MANOMETRY N/A 09/18/2014   Procedure: ESOPHAGEAL MANOMETRY (EM);  Surgeon: Beryle Beams, MD;  Location: [facility];  Service: Endoscopy;  Laterality: N/A;  . VASECTOMY     Social History   Social History  . Marital status: Married    Spouse name: N/A  . Number of children: 2  . Years of education: N/A   Occupational History  . retired (tobacco  buyer) [employer]   Social History Main Topics  . Smoking status: Former Smoker    Quit date: 07/05/2011  . Smokeless tobacco: Current User    Types: Snuff  . Alcohol use No     Comment: no alcohol since about 2000  . Drug use: No  . Sexual activity: Not Currently   Other Topics Concern  . None   Social History Narrative  . None   No Known Allergies Family History  Problem Relation Age of Onset  . Lung cancer Mother   . Autism Son     Past medical history, social, surgical and family history all reviewed in electronic medical record.  No pertanent information unless stated regarding to the chief complaint.   Review of Systems: No headache, visual changes, nausea, vomiting, diarrhea, constipation, dizziness, abdominal pain, skin rash, fevers, chills, night sweats, weight loss, swollen lymph nodes, body aches, joint swelling, chest pain, shortness of breath, mood changes.    Objective  Blood pressure 122/84, pulse 70, height 5\' 9"  (1.753 m), SpO2 98 %.   Systems examined below as of 12/25/16 General: NAD A&O x3 mood, affect normal  HEENT: Pupils equal, extraocular movements intact no nystagmus Respiratory: not short of breath at rest or with speaking Cardiovascular: No lower extremity edema, non tender Skin: Warm dry intact with no signs of infection or rash on extremities or on axial skeleton.  Abdomen: Soft nontender, no masses Neuro: Cranial nerves  intact, neurovascularly intact in all extremities with 2+ DTRs and 2+ pulses. Lymph: No lymphadenopathy appreciated today  Gait normal with good balance and coordination.  MSK: Non tender with full range of motion and good stability and symmetric strength and tone of  elbows, wrist,  knee hips and ankles bilaterally.    Shoulder:left Inspection reveals no abnormalities, atrophy or asymmetry. Palpation is normal with no tenderness over AC joint or bicipital groove. ROM is full in all planes.  4 out of 5  strength on the left Perative impingement Speeds and Yergason's tests normal. Positive O'Brien's with worsening pain positive crossover test Normal scapular function observed. Negative apprehension sign  contralateral shoulder unremarkable     Impression and Recommendations:     This case required medical decision making of moderate complexity.      Note: This dictation was prepared with Dragon dictation along with smaller phrase technology. Any transcriptional errors that result from this process are unintentional.

## 2016-12-25 NOTE — Patient Instructions (Addendum)
Good to see you  Alvera Singh is your friend.  Dr. Tamera Punt at [facility] will be calling you  I am here if you have questions.

## 2016-12-25 NOTE — Assessment & Plan Note (Signed)
Worsening symptoms as well. Patient does have edema noted on MRI. I do believe the labral tear and this acromion clavicular arthritis is likely going to continue doing give patient pain. Patient is failed all conservative therapy including injections and formal physical therapy. Patient will be sent to orthopedic surgery to discuss treatment options. Patient given other options which included PRP stem cell therapy which patient declined I am in agreement with this plan.  Spent  25 minutes with patient face-to-face and had greater than 50% of counseling including as described above in assessment and plan.

## 2016-12-29 DIAGNOSIS — M24812 Other specific joint derangements of left shoulder, not elsewhere classified: Secondary | ICD-10-CM | POA: Diagnosis not present

## 2016-12-29 DIAGNOSIS — M67912 Unspecified disorder of synovium and tendon, left shoulder: Secondary | ICD-10-CM | POA: Diagnosis not present

## 2017-01-13 DIAGNOSIS — M7542 Impingement syndrome of left shoulder: Secondary | ICD-10-CM | POA: Diagnosis not present

## 2017-01-13 DIAGNOSIS — S43432A Superior glenoid labrum lesion of left shoulder, initial encounter: Secondary | ICD-10-CM | POA: Diagnosis not present

## 2017-01-13 DIAGNOSIS — M19012 Primary osteoarthritis, left shoulder: Secondary | ICD-10-CM | POA: Diagnosis not present

## 2017-01-13 DIAGNOSIS — M7552 Bursitis of left shoulder: Secondary | ICD-10-CM | POA: Diagnosis not present

## 2017-01-13 DIAGNOSIS — Y999 Unspecified external cause status: Secondary | ICD-10-CM | POA: Diagnosis not present

## 2017-01-13 DIAGNOSIS — G8918 Other acute postprocedural pain: Secondary | ICD-10-CM | POA: Diagnosis not present

## 2017-01-13 DIAGNOSIS — S43432D Superior glenoid labrum lesion of left shoulder, subsequent encounter: Secondary | ICD-10-CM | POA: Diagnosis not present

## 2017-01-28 DIAGNOSIS — M19012 Primary osteoarthritis, left shoulder: Secondary | ICD-10-CM | POA: Diagnosis not present

## 2017-02-09 ENCOUNTER — Other Ambulatory Visit: Payer: Self-pay | Admitting: Family Medicine

## 2017-02-09 NOTE — Telephone Encounter (Signed)
Is this okay to refill? 

## 2017-02-25 DIAGNOSIS — Z9889 Other specified postprocedural states: Secondary | ICD-10-CM | POA: Diagnosis not present

## 2017-02-26 DIAGNOSIS — M79644 Pain in right finger(s): Secondary | ICD-10-CM | POA: Diagnosis not present

## 2017-03-11 ENCOUNTER — Other Ambulatory Visit: Payer: Self-pay | Admitting: Neurology

## 2017-04-14 DIAGNOSIS — H2513 Age-related nuclear cataract, bilateral: Secondary | ICD-10-CM | POA: Diagnosis not present

## 2017-04-14 DIAGNOSIS — H40033 Anatomical narrow angle, bilateral: Secondary | ICD-10-CM | POA: Diagnosis not present

## 2017-04-19 HISTORY — PX: SHOULDER ARTHROSCOPY: SHX128

## 2017-04-23 ENCOUNTER — Encounter: Payer: Self-pay | Admitting: Family Medicine

## 2017-04-23 ENCOUNTER — Ambulatory Visit (INDEPENDENT_AMBULATORY_CARE_PROVIDER_SITE_OTHER): Payer: PPO | Admitting: Family Medicine

## 2017-04-23 VITALS — BP 130/78 | HR 69 | Temp 97.9°F | Wt 164.0 lb

## 2017-04-23 DIAGNOSIS — H938X2 Other specified disorders of left ear: Secondary | ICD-10-CM

## 2017-04-23 NOTE — Progress Notes (Signed)
Subjective:  Harold Day is a 66 y.o. male who presents for a 2 week history of left ear fullness. Denies pain or fever. No rhinorrhea, nasal congestion, post nasal drainage or cough.    Treatment to date: nasal steroids.  Denies sick contacts.  No other aggravating or relieving factors.  No other c/o.  ROS as in subjective.   Objective: Vitals:   04/23/17 1145  BP: 130/78  Pulse: 69  Temp: 97.9 F (36.6 C)    General appearance: Alert, WD/WN, no distress, well - appearing                             Skin: warm, no rash                           Head: no sinus tenderness                            Eyes: conjunctiva normal, corneas clear, PERRLA                            Ears: pearly TMs, external ear canals normal                          Nose: septum midline, turbinates swollen, with erythema and clear discharge             Mouth/throat: MMM, tongue normal, mild pharyngeal erythema                           Neck: supple, no adenopathy, no thyromegaly, nontender                                Assessment: Ear fullness, left   Plan: Discussed that his ear does not look abnormal. Recommend that he use Afrin before he gets on the plane tomorrow.  Follow up as needed.

## 2017-04-23 NOTE — Patient Instructions (Signed)
Use Afrin before you get on the plane.

## 2017-06-01 ENCOUNTER — Other Ambulatory Visit: Payer: Self-pay | Admitting: Neurology

## 2017-08-20 ENCOUNTER — Encounter: Payer: Self-pay | Admitting: Family Medicine

## 2017-08-20 ENCOUNTER — Ambulatory Visit (INDEPENDENT_AMBULATORY_CARE_PROVIDER_SITE_OTHER): Payer: PPO | Admitting: Family Medicine

## 2017-08-20 VITALS — BP 120/82 | HR 69 | Ht 68.5 in | Wt 162.8 lb

## 2017-08-20 DIAGNOSIS — G250 Essential tremor: Secondary | ICD-10-CM | POA: Diagnosis not present

## 2017-08-20 DIAGNOSIS — Z0000 Encounter for general adult medical examination without abnormal findings: Secondary | ICD-10-CM | POA: Diagnosis not present

## 2017-08-20 DIAGNOSIS — N401 Enlarged prostate with lower urinary tract symptoms: Secondary | ICD-10-CM

## 2017-08-20 DIAGNOSIS — Z23 Encounter for immunization: Secondary | ICD-10-CM | POA: Diagnosis not present

## 2017-08-20 DIAGNOSIS — F341 Dysthymic disorder: Secondary | ICD-10-CM

## 2017-08-20 LAB — POCT URINALYSIS DIP (PROADVANTAGE DEVICE)
BILIRUBIN UA: NEGATIVE mg/dL
Bilirubin, UA: NEGATIVE
Blood, UA: NEGATIVE
Glucose, UA: NEGATIVE mg/dL
Leukocytes, UA: NEGATIVE
Nitrite, UA: NEGATIVE
PH UA: 7 (ref 5.0–8.0)
PROTEIN UA: NEGATIVE mg/dL
SPECIFIC GRAVITY, URINE: 1.015
Urobilinogen, Ur: NEGATIVE

## 2017-08-20 LAB — LIPID PANEL
CHOLESTEROL: 230 mg/dL — AB (ref <200)
HDL: 42 mg/dL (ref >40)
LDL Cholesterol (Calc): 144 mg/dL (calc) — ABNORMAL HIGH
Non-HDL Cholesterol (Calc): 188 mg/dL (calc) — ABNORMAL HIGH (ref <130)
Total CHOL/HDL Ratio: 5.5 (calc) — ABNORMAL HIGH (ref <5.0)
Triglycerides: 305 mg/dL — ABNORMAL HIGH (ref <150)

## 2017-08-20 NOTE — Patient Instructions (Signed)
  Mr. Harold Day , Thank you for taking time to come for your Medicare Wellness Visit. I appreciate your ongoing commitment to your health goals. Please review the following plan we discussed and let me know if I can assist you in the future.   These are the goals we discussed: Goals    None      This is a list of the screening recommended for you and due dates:  Health Maintenance  Topic Date Due  . Flu Shot  05/20/2017  . Pneumonia vaccines (2 of 2 - PPSV23) 07/17/2017  . Colon Cancer Screening  06/01/2023  . Tetanus Vaccine  07/17/2026  .  Hepatitis C: One time screening is recommended by Center for Disease Control  (CDC) for  adults born from [birth year] through 1965.   Completed

## 2017-08-20 NOTE — Progress Notes (Signed)
Harold Day is a 66 y.o. male who presents for annual wellness visit and follow-up on chronic medical conditions.  He has the following concerns: He stopped taking both the Proscar and the Flomax stating he didn't think it was helping with his bladder symptoms but definitely noted Flomax interfering with orgasm. He continues on Effexor does note some sleep difficulties and has been using Benadryl fairly regularly. He uses Inderal intermittently for his hand tremor. This is usually or 4 times per week. He does have underlying reflux and does use Nexium regularly. He cannot use it every other day. He remains alcohol and cigarette free.  Immunizations and Health Maintenance Immunization History  Administered Date(s) Administered  . DT 02/01/1996  . Hepatitis A, Adult 07/05/2015, 08/06/2015  . Influenza Split 09/08/2002, 07/23/2006  . Influenza, High Dose Seasonal PF 07/17/2016  . Influenza,inj,Quad PF,6+ Mos 07/05/2015  . Pneumococcal Conjugate-13 07/17/2016  . Td 07/23/2006  . Tdap 07/17/2016  . Zoster 03/08/2012   Health Maintenance Due  Topic Date Due  . INFLUENZA VACCINE  05/20/2017  . PNA vac Low Risk Adult (2 of 2 - PPSV23) 07/17/2017    Last colonoscopy: 2014 Last PSA:  2017 Dentist: Dr. Gilford Raid- every 6 months Ophtho: Dr. Eulis Manly Exercise: 4 or 5 days a week  Other doctors caring for patient include:none Tat  none  Advanced Directives:    Depression screen:  See questionnaire below.     Depression screen [facility] 2/9 08/20/2017 07/17/2016 02/12/2016 02/12/2016 02/12/2016  Decreased Interest 0 1 3 2 3   Down, Depressed, Hopeless 0 1 3 3 3   PHQ - 2 Score 0 2 6 5 6   Altered sleeping - - 3 2 -  Tired, decreased energy - - 3 3 -  Change in appetite - - 1 0 -  Feeling bad or failure about yourself  - - 2 3 -  Trouble concentrating - - 3 3 -  Moving slowly or fidgety/restless - - 2 3 -  Suicidal thoughts - - 1 1 -  PHQ-9 Score - - 21 20 -  Difficult doing work/chores - - Very  difficult Very difficult -    Fall Screen: See Questionaire below.   Fall Risk  08/20/2017 09/25/2016 07/17/2016 07/05/2015  Falls in the past year? No No No No    ADL screen:  See questionnaire below.  Functional Status Survey: Is the patient deaf or have difficulty hearing?: No Does the patient have difficulty seeing, even when wearing glasses/contacts?: No Does the patient have difficulty concentrating, remembering, or making decisions?: Yes (remembering) Does the patient have difficulty walking or climbing stairs?: No Does the patient have difficulty dressing or bathing?: No Does the patient have difficulty doing errands alone such as visiting a doctor's office or shopping?: No   Review of Systems  Constitutional: -, -unexpected weight change, -anorexia, -fatigue Allergy: -sneezing, -itching, -congestion Dermatology: denies changing moles, rash, lumps ENT: -runny nose, -ear pain, -sore throat,  Cardiology:  -chest pain, -palpitations, -orthopnea, Respiratory: -cough, -shortness of breath, -dyspnea on exertion, -wheezing,  Gastroenterology: -abdominal pain, -nausea, -vomiting, -diarrhea, -constipation, -dysphagia Hematology: -bleeding or bruising problems Musculoskeletal: -arthralgias, -myalgias, -joint swelling, -back pain, - Ophthalmology: -vision changes,  Urology: -dysuria, -difficulty urinating,   Neurology: -, -numbness, , -memory loss, -falls, -dizziness    PHYSICAL EXAM:   General Appearance: Alert, cooperative, no distress, appears stated age Head: Normocephalic, without obvious abnormality, atraumatic Eyes: PERRL, conjunctiva/corneas clear, EOM's intact, fundi benign Ears: Normal TM's and external ear canals Nose:  Nares normal, mucosa normal, no drainage or sinus   tenderness Throat: Lips, mucosa, and tongue normal; teeth and gums normal Neck: Supple, no lymphadenopathy, thyroid:no enlargement/tenderness/nodules; no carotid bruit or JVD Lungs: Clear to  auscultation bilaterally without wheezes, rales or ronchi; respirations unlabored Heart: Regular rate and rhythm, S1 and S2 normal, no murmur, rub or gallop Abdomen: Soft, non-tender, nondistended, normoactive bowel sounds, no masses, no hepatosplenomegaly Extremities: No clubbing, cyanosis or edema Pulses: 2+ and symmetric all extremities Skin: Skin color, texture, turgor normal, no rashes or lesions Lymph nodes: Cervical, supraclavicular, and axillary nodes normal Neurologic: CNII-XII intact, normal strength, sensation and gait; reflexes 2+ and symmetric throughout   Psych: Normal mood, affect, hygiene and grooming  ASSESSMENT/PLAN: Encounter for Medicare annual wellness exam - Plan: POCT Urinalysis DIP (Proadvantage Device)  Needs flu shot - Plan: Flu vaccine HIGH DOSE PF (Fluzone High Dose)  Need for vaccination against Streptococcus pneumoniae - Plan: Pneumococcal polysaccharide vaccine 23-valent greater than or equal to 2yo subcutaneous/IM  Dysthymia  Routine general medical examination at a health care facility - Plan: Lipid panel  Benign prostatic hyperplasia with lower urinary tract symptoms, symptom details unspecified  Benign essential tremor He seems be tolerating his urinary symptoms and not interested in any other medication. Discussed possibly cutting back on his Effexor. He will try this to see if this will help with his sleep as he says he is having some difficulty with that. He does use the Inderal as needed for his tremor. He will continue to use the Nexium on a regular basis.  Medicare Attestation I have personally reviewed: The patient's medical and social history Their use of alcohol, tobacco or illicit drugs Their current medications and supplements The patient's functional ability including ADLs,fall risks, home safety risks, cognitive, and hearing and visual impairment Diet and physical activities Evidence for depression or mood disorders  The patient's  weight, height, and BMI have been recorded in the chart.  I have made referrals, counseling, and provided education to the patient based on review of the above and I have provided the patient with a written personalized care plan for preventive services.     Wyatt Haste, MD   08/20/2017

## 2017-09-08 ENCOUNTER — Other Ambulatory Visit: Payer: Self-pay | Admitting: Family Medicine

## 2017-09-08 NOTE — Telephone Encounter (Signed)
Ok to renew?  

## 2017-09-14 ENCOUNTER — Other Ambulatory Visit: Payer: Self-pay | Admitting: Neurology

## 2017-09-14 DIAGNOSIS — M79644 Pain in right finger(s): Secondary | ICD-10-CM | POA: Diagnosis not present

## 2017-09-14 MED ORDER — PROPRANOLOL HCL 20 MG PO TABS: 20.0000 mg | ORAL_TABLET | Freq: Every day | ORAL | 0 refills | Status: DC | PRN

## 2017-09-24 NOTE — Progress Notes (Signed)
Subjective:   Harold Day was seen in consultation in the movement disorder clinic at the request of Denita Lung, MD.  The evaluation is for tremor and hyperreflexia.  Tremor started approximately 4-5 months ago and involves the bilateral UE.  Tremor is most noticeable when he eats or is in social situations.  He never notes it at rest.   There is no family hx of tremor.  Neuroimaging of the brain has not previously been performed  Affected by caffeine:  No. (2 small cups per day) Affected by alcohol:  Doesn't drink (hx of EtOHism) Affected by stress:  Doesn't have lots of stress but thinks so Affected by fatigue:  Yes.   Spills soup if on spoon:  May or may not Spills glass of liquid if full:  No. Affects ADL's (tying shoes, brushing teeth, etc):  No.  Current/Previously tried tremor medications: none  Current medications that may exacerbate tremor:  N/a (just added Viibryd but tremor was there first)  Outside reports reviewed: lab reports, office notes and referral letter/letters.  C/o spells where "loses vision" and becomes blurry.  If he closes an eye it is better.  It only lasts minute or two.  He feels near syncopal but no palpitations.  No curtain over eye.  No lateralizing weakness/paresthesia.     09/25/17 update: Patient seen today in follow-up.  I have not seen him in about a year.  I have reviewed records available to me since last visit.  He was given propranolol, 20 mg, to use on as needed basis for tremor.  He called me in January, 2018 to state that he did not use the medication very much, but he did find it helpful when he used it.  He uses it 2-3 times per week.   It helps.  Its worse with stress. He has no SE.  He had shoulder surgery on the L.    No Known Allergies  Outpatient Encounter Medications as of 09/25/2017  Medication Sig  . aspirin 81 MG tablet Take 81 mg by mouth daily.  Marland Kitchen esomeprazole (NEXIUM) 20 MG capsule Take 20 mg by mouth daily at 12 noon.    . finasteride (PROSCAR) 5 MG tablet Take 1 tablet (5 mg total) by mouth daily.  . fish oil-omega-3 fatty acids 1000 MG capsule Take 2 g by mouth daily.  Marland Kitchen ibuprofen (ADVIL,MOTRIN) 200 MG tablet Take 600 mg by mouth every 4 (four) hours.  . Multiple Vitamins-Minerals (MULTIVITAMIN WITH MINERALS) tablet Take 1 tablet by mouth daily.  . propranolol (INDERAL) 20 MG tablet Take 1 tablet (20 mg total) by mouth daily as needed.  . triamcinolone (NASACORT AQ) 55 MCG/ACT nasal inhaler Place 2 sprays into the nose daily.  Marland Kitchen venlafaxine (EFFEXOR) 75 MG tablet take 1 tablet by mouth twice a day   No facility-administered encounter medications on file as of 09/25/2017.     Past Medical History:  Diagnosis Date  . Alcoholic ([place])   . Allergic rhinitis   . Depression   . Former smoker    quit 2011  . GERD (gastroesophageal reflux disease)   . Hepatitis C    noticed when he tried to give blood; never had illness    Past Surgical History:  Procedure Laterality Date  . [place] STUDY N/A 09/18/2014   Procedure: [place] STUDY;  Surgeon: Beryle Beams, MD;  Location: [facility];  Service: Endoscopy;  Laterality: N/A;  . COLONOSCOPY    .  ESOPHAGEAL MANOMETRY N/A 09/18/2014   Procedure: ESOPHAGEAL MANOMETRY (EM);  Surgeon: Beryle Beams, MD;  Location: [facility];  Service: Endoscopy;  Laterality: N/A;  . VASECTOMY      Social History   Socioeconomic History  . Marital status: Married    Spouse name: Not on file  . Number of children: 2  . Years of education: Not on file  . Highest education level: Not on file  Social Needs  . Financial resource strain: Not on file  . Food insecurity - worry: Not on file  . Food insecurity - inability: Not on file  . Transportation needs - medical: Not on file  . Transportation needs - non-medical: Not on file  Occupational History  . Occupation: retired (Counsellor)    Employer: [employer]  Tobacco Use  . Smoking status:  Former Smoker    Last attempt to quit: 07/05/2011    Years since quitting: 6.2  . Smokeless tobacco: Current User    Types: Snuff  Substance and Sexual Activity  . Alcohol use: No    Comment: no alcohol since about 2000  . Drug use: No  . Sexual activity: Yes  Other Topics Concern  . Not on file  Social History Narrative  . Not on file    Family Status  Relation Name Status  . Mother  Deceased  . Father  Deceased  . Sister  Alive  . Son 2 Alive    Review of Systems A complete 10 system ROS was obtained and was negative apart from what is mentioned.   Objective:   VITALS:   Vitals:   09/25/17 0842  BP: 136/86  Pulse: 68  SpO2: 96%  Weight: 168 lb (76.2 kg)  Height: 5' 8.5" (1.74 m)   Gen:  Appears stated age and in NAD. HEENT:  Normocephalic, atraumatic. The mucous membranes are moist. The superficial temporal arteries are without ropiness or tenderness. Cardiovascular: Regular rate and rhythm. Lungs: Clear to auscultation bilaterally. Neck: There is a L carotid bruis  NEUROLOGICAL:  Orientation:  The patient is alert and oriented x 3.  Cranial nerves: There is good facial symmetry. Speech is fluent and clear. Soft palate rises symmetrically and there is no tongue deviation. Hearing is intact to conversational tone. Tone: Tone is good throughout. Sensation: Sensation is intact to light touch  Coordination:  The patient has no difficulty with RAM's or FNF bilaterally. Motor: Strength is 5/5 in the bilateral upper and lower extremities.  Shoulder shrug is equal bilaterally.  There is no pronator drift.  There are no fasciculations noted. Gait and Station: The patient is able to ambulate without difficulty. The patient is able to heel toe walk without any difficulty. The patient is able to ambulate in a tandem fashion. The patient is able to stand in the Romberg position. Abnormal movement:  There is mild tremor of the outstretched hands, L more than  R   LABS:  Lab Results  Component Value Date   TSH 1.23 09/22/2016     Chemistry      Component Value Date/Time   NA 141 09/22/2016 0820   K 4.2 09/22/2016 0820   CL 107 09/22/2016 0820   CO2 27 09/22/2016 0820   BUN 13 09/22/2016 0820   CREATININE 1.20 09/22/2016 0820      Component Value Date/Time   CALCIUM 9.4 09/22/2016 0820   ALKPHOS 47 09/22/2016 0820   AST 20 09/22/2016 0820   ALT 20 09/22/2016 0820  BILITOT 0.4 09/22/2016 0820     No results found for: HGBA1C  No results found for: VITAMINB12      Assessment/Plan:   1.  Essential Tremor.  -He is doing well with propranolol 20 mg prn, 2-3 days per week.  Risks, benefits, side effects and alternative therapies were discussed.  The opportunity to ask questions was given and they were answered to the best of my ability.  The patient expressed understanding and willingness to follow the outlined treatment protocols.  2.  L carotid bruit  -being f/u by PCP.  Last u/s a year ago and demonstrated bilateral ICA stenosis, L more than R, of less than 50%  3.  F/u one year   CC:  Denita Lung, MD

## 2017-09-25 ENCOUNTER — Encounter: Payer: Self-pay | Admitting: Neurology

## 2017-09-25 ENCOUNTER — Ambulatory Visit: Payer: PPO | Admitting: Neurology

## 2017-09-25 VITALS — BP 136/86 | HR 68 | Ht 68.5 in | Wt 168.0 lb

## 2017-09-25 DIAGNOSIS — G250 Essential tremor: Secondary | ICD-10-CM | POA: Diagnosis not present

## 2017-09-25 DIAGNOSIS — R0989 Other specified symptoms and signs involving the circulatory and respiratory systems: Secondary | ICD-10-CM

## 2017-09-25 MED ORDER — PROPRANOLOL HCL 20 MG PO TABS: 20.0000 mg | ORAL_TABLET | Freq: Every day | ORAL | 1 refills | Status: DC | PRN

## 2017-10-21 ENCOUNTER — Telehealth: Payer: Self-pay | Admitting: Family Medicine

## 2017-10-21 NOTE — Telephone Encounter (Signed)
Pt dropped  off form to be signed, for him to go on a short term mission trip, pt can be reached at  [phone] put in your folder

## 2017-10-27 DIAGNOSIS — M79644 Pain in right finger(s): Secondary | ICD-10-CM | POA: Diagnosis not present

## 2017-12-03 ENCOUNTER — Encounter: Payer: Self-pay | Admitting: Family Medicine

## 2017-12-03 ENCOUNTER — Ambulatory Visit (INDEPENDENT_AMBULATORY_CARE_PROVIDER_SITE_OTHER): Payer: PPO | Admitting: Family Medicine

## 2017-12-03 VITALS — BP 140/86 | HR 75 | Temp 97.8°F | Wt 164.4 lb

## 2017-12-03 DIAGNOSIS — J0100 Acute maxillary sinusitis, unspecified: Secondary | ICD-10-CM | POA: Diagnosis not present

## 2017-12-03 MED ORDER — AMOXICILLIN-POT CLAVULANATE 875-125 MG PO TABS: 1.0000 | ORAL_TABLET | Freq: Two times a day (BID) | ORAL | 0 refills | Status: DC

## 2017-12-03 NOTE — Progress Notes (Signed)
   Subjective:    Patient ID: Harold Day, male    DOB: 1951/08/14, 67 y.o.   MRN: [id number]  HPI Last Saturday while in [place] he had the sudden onset of fever, chills, cough, myalgias with fatigue and headache.  He got better the next day but Monday he again had difficulty with fatigue, chills, headache, nasal congestion, rhinorrhea and PND.  The symptoms of slowly gotten worse over the last several days.  He does not smoke.   Review of Systems     Objective:   Physical Exam Alert and in no distress. Tympanic membranes and canals are normal. Pharyngeal area is normal. Neck is supple without adenopathy or thyromegaly. Cardiac exam shows a regular sinus rhythm without murmurs or gallops. Lungs are clear to auscultation. Nasal mucosa is red with tenderness especially over maxillary sinuses       Assessment & Plan:  Acute non-recurrent maxillary sinusitis - Plan: amoxicillin-clavulanate (AUGMENTIN) 875-125 MG tablet  He will call if not entirely better when he finishes the antibiotic.

## 2018-01-27 ENCOUNTER — Ambulatory Visit (INDEPENDENT_AMBULATORY_CARE_PROVIDER_SITE_OTHER): Payer: PPO | Admitting: Family Medicine

## 2018-01-27 ENCOUNTER — Encounter: Payer: Self-pay | Admitting: Family Medicine

## 2018-01-27 VITALS — BP 142/88 | HR 67 | Temp 97.5°F | Ht 68.0 in | Wt 165.8 lb

## 2018-01-27 DIAGNOSIS — R51 Headache: Secondary | ICD-10-CM

## 2018-01-27 DIAGNOSIS — R519 Headache, unspecified: Secondary | ICD-10-CM

## 2018-01-27 NOTE — Progress Notes (Signed)
   Subjective:    Patient ID: Harold Day, male    DOB: 1951/09/01, 67 y.o.   MRN: [id number]  HPI He complains of a 3-week history of headache that he describes as sharp and in the occipital area lasting roughly 1 hour.  It tends to occur usually late on in the afternoon.  No blurred vision, double vision, nausea, numbness, tingling or weakn in his armsess.  No sneezing, itchy watery eyes.  He has used ibuprofen which apparently does help.  Does have a previous history of hand tremor.  He has noted some difficulty recently with with his golf swing and thinks this might be related.   Review of Systems     Objective:   Physical Exam Alert and in no distress.  No tenderness to palpation in the occipital area.  Full motion of the neck.  Normal motor, sensory, and DTRs.  Negative anger to nose.  EOMI.       Assessment & Plan:  Occipital headache I discussed options with him.  We will be conservative initially with using heat for 20 minutes as needed for the occipital type pain as well as 800 mg of ibuprofen.  If he continues have difficulty, referral to neurology will be made.  Explained that his trouble with his golf swing at this point does not necessarily seem to be related to anything neurologic.

## 2018-02-08 ENCOUNTER — Telehealth: Payer: Self-pay | Admitting: Neurology

## 2018-02-08 NOTE — Telephone Encounter (Signed)
Pt called and said he is having a lot of headaches and wants to see Dr Tat sooner than his December appointment, please advise

## 2018-02-08 NOTE — Telephone Encounter (Signed)
Spoke with patient. He has been seeing PCP for headache. His PCP suggested him to call us. Aware Dr. Carles Collet hasn't ever seen him for headache and doesn't typically see headaches. He will talk with PCP about a referral/treatment.

## 2018-02-08 NOTE — Telephone Encounter (Signed)
Patient returning your call he left a VM

## 2018-02-08 NOTE — Telephone Encounter (Signed)
Left message on machine for patient to call back.  I don't see where patient has ever been treated for headaches here. Will await call back to discuss.

## 2018-03-16 ENCOUNTER — Encounter: Payer: Self-pay | Admitting: Family Medicine

## 2018-03-16 ENCOUNTER — Other Ambulatory Visit: Payer: Self-pay | Admitting: Family Medicine

## 2018-03-16 ENCOUNTER — Ambulatory Visit (INDEPENDENT_AMBULATORY_CARE_PROVIDER_SITE_OTHER): Payer: PPO | Admitting: Family Medicine

## 2018-03-16 VITALS — BP 112/70 | HR 80 | Temp 98.3°F | Ht 68.75 in | Wt 163.0 lb

## 2018-03-16 DIAGNOSIS — M7701 Medial epicondylitis, right elbow: Secondary | ICD-10-CM

## 2018-03-16 DIAGNOSIS — F418 Other specified anxiety disorders: Secondary | ICD-10-CM

## 2018-03-16 MED ORDER — ALPRAZOLAM 0.25 MG PO TABS: 0.2500 mg | ORAL_TABLET | Freq: Two times a day (BID) | ORAL | 0 refills | Status: DC | PRN

## 2018-03-16 NOTE — Progress Notes (Signed)
   Subjective:    Patient ID: Harold Day, male    DOB: 13-Nov-1950, 67 y.o.   MRN: [id number]  HPI He injured his right elbow playing golf.  He has had difficulty with his golf swing and now several times has actually slammed the club down into the ground rather than finishing his swing.  He has what is commonly called the yips!   Review of Systems     Objective:   Physical Exam Alert and in no distress.  Slight tenderness to palpation is noted at the medial epicondyle.       Assessment & Plan:  Medial epicondylitis of right elbow  Performance anxiety - Plan: ALPRAZolam (XANAX) 0.25 MG tablet He recognizes that this is definitely anxiety and wants to try Xanax to see if that would help.  I told him that that was fine but we would need to necessarily get him involved in counseling as I do not want to continue him on Xanax every time he wants to play golf.

## 2018-03-16 NOTE — Telephone Encounter (Signed)
[facility] is requesting to fill pt effexor. Please advise KH 

## 2018-04-23 ENCOUNTER — Encounter: Payer: Self-pay | Admitting: Family Medicine

## 2018-04-23 ENCOUNTER — Ambulatory Visit (INDEPENDENT_AMBULATORY_CARE_PROVIDER_SITE_OTHER): Payer: PPO | Admitting: Family Medicine

## 2018-04-23 VITALS — BP 140/80 | HR 77 | Temp 98.7°F | Resp 16 | Wt 164.4 lb

## 2018-04-23 DIAGNOSIS — M109 Gout, unspecified: Secondary | ICD-10-CM

## 2018-04-23 MED ORDER — COLCHICINE 0.6 MG PO TABS: ORAL_TABLET | ORAL | 0 refills | Status: DC

## 2018-04-23 NOTE — Progress Notes (Signed)
   Subjective:    Patient ID: Harold Day, male    DOB: [birth year], 67 y.o.   MRN: [id number]  HPI Chief Complaint  Patient presents with  . gout    gout flare up in left toe.   He is here with complaints of a 2 day history of left great toe pain, redness, erythema and severe tenderness.   States he thinks he has gout. Denies history of gout. Denies injury.  No recent changes in diet.  He has tried tylenol without relief.   Denies fever, chills, dizziness, chest pain, palpitations, shortness of breath, abdominal pain, N/V/D.    Review of Systems Pertinent positives and negatives in the history of present illness.     Objective:   Physical Exam BP 140/80   Pulse 77   Temp 98.7 F (37.1 C) (Oral)   Resp 16   Wt 164 lb 6.4 oz (74.6 kg)   SpO2 98%   BMI 24.45 kg/m   Erythema, edema and tenderness to left first MTP joint and lateral aspect of great toe. Foot otherwise with normal sensation and movement. Left ankle normal.       Assessment & Plan:  Acute gout of left foot, unspecified cause - Plan: colchicine 0.6 MG tablet  Discussed sending him for an XR and he declines. Will treat him for presumed gout attack and have him follow up with his PCP in 2-3 weeks when back to baseline. Did not check uric acid since he is having an acute flare. He will need this at follow up.

## 2018-04-23 NOTE — Patient Instructions (Addendum)
Take the colchicine as prescribed. You should also take 2 Aleve twice daily with food for the next 2-3 days.    Return 2 weeks after you are back to your normal to see Dr. Redmond School. You will need a uric acid level checked.    Gout Gout is painful swelling that can happen in some of your joints. Gout is a type of arthritis. This condition is caused by having too much uric acid in your body. Uric acid is a chemical that is made when your body breaks down substances called purines. If your body has too much uric acid, sharp crystals can form and build up in your joints. This causes pain and swelling. Gout attacks can happen quickly and be very painful (acute gout). Over time, the attacks can affect more joints and happen more often (chronic gout). Follow these instructions at home: During a Gout Attack  If directed, put ice on the painful area: ? Put ice in a plastic bag. ? Place a towel between your skin and the bag. ? Leave the ice on for 20 minutes, 2-3 times a day.  Rest the joint as much as possible. If the joint is in your leg, you may be given crutches to use.  Raise (elevate) the painful joint above the level of your heart as often as you can.  Drink enough fluids to keep your pee (urine) clear or pale yellow.  Take over-the-counter and prescription medicines only as told by your doctor.  Do not drive or use heavy machinery while taking prescription pain medicine.  Follow instructions from your doctor about what you can or cannot eat and drink.  Return to your normal activities as told by your doctor. Ask your doctor what activities are safe for you. Avoiding Future Gout Attacks  Follow a low-purine diet as told by a specialist (dietitian) or your doctor. Avoid foods and drinks that have a lot of purines, such as: ? Liver. ? Kidney. ? Anchovies. ? Asparagus. ? Herring. ? Mushrooms ? Mussels. ? Beer.  Limit alcohol intake to no more than 1 drink a day for nonpregnant  women and 2 drinks a day for men. One drink equals 12 oz of beer, 5 oz of wine, or 1 oz of hard liquor.  Stay at a healthy weight or lose weight if you are overweight. If you want to lose weight, talk with your doctor. It is important that you do not lose weight too fast.  Start or continue an exercise plan as told by your doctor.  Drink enough fluids to keep your pee clear or pale yellow.  Take over-the-counter and prescription medicines only as told by your doctor.  Keep all follow-up visits as told by your doctor. This is important. Contact a doctor if:  You have another gout attack.  You still have symptoms of a gout attack after10 days of treatment.  You have problems (side effects) because of your medicines.  You have chills or a fever.  You have burning pain when you pee (urinate).  You have pain in your lower back or belly. Get help right away if:  You have very bad pain.  Your pain cannot be controlled.  You cannot pee. This information is not intended to replace advice given to you by your health care provider. Make sure you discuss any questions you have with your health care provider. Document Released: 07/15/2008 Document Revised: 03/13/2016 Document Reviewed: 07/19/2015 Elsevier Interactive Patient Education  [employer].

## 2018-05-17 ENCOUNTER — Ambulatory Visit (INDEPENDENT_AMBULATORY_CARE_PROVIDER_SITE_OTHER): Payer: PPO | Admitting: Family Medicine

## 2018-05-17 ENCOUNTER — Encounter: Payer: Self-pay | Admitting: Family Medicine

## 2018-05-17 VITALS — BP 128/86 | HR 65 | Temp 97.4°F | Wt 166.0 lb

## 2018-05-17 DIAGNOSIS — B079 Viral wart, unspecified: Secondary | ICD-10-CM | POA: Diagnosis not present

## 2018-05-17 DIAGNOSIS — F418 Other specified anxiety disorders: Secondary | ICD-10-CM

## 2018-05-17 DIAGNOSIS — M109 Gout, unspecified: Secondary | ICD-10-CM | POA: Diagnosis not present

## 2018-05-17 NOTE — Progress Notes (Signed)
   Subjective:    Patient ID: Harold Day, male    DOB: [birth year], 67 y.o.   MRN: [id number]  HPI He is here for a follow-up visit.  He was recently treated for gout and presently is having no pain of any kind.  He does need follow-up on his uric acid.  He also continues have difficulty with performance anxiety mainly revolving around golf and getting the "yips".  He is using Inderal to help with intermittent difficulty with the tremor and has been using 20 mg without much success.  He also has a lesion on his scalp that he would like evaluated.   Review of Systems     Objective:   Physical Exam Alert and in no distress.  Exam of the scalp does show a wart.       Assessment & Plan:  Gout involving toe of left foot, unspecified cause, unspecified chronicity - Plan: Comprehensive metabolic panel, Uric Acid  Performance anxiety  Viral warts, unspecified type Recommend he return here for excision of the wart.  Also discussed his performance anxiety and recommend that he use 40 mg of Adderall before he plays golf to see if this will help with his jerking type motions especially when he chips.

## 2018-05-18 LAB — COMPREHENSIVE METABOLIC PANEL
ALT: 19 IU/L (ref 0–44)
AST: 16 IU/L (ref 0–40)
Albumin/Globulin Ratio: 1.7 (ref 1.2–2.2)
Albumin: 4.4 g/dL (ref 3.6–4.8)
Alkaline Phosphatase: 79 IU/L (ref 39–117)
BILIRUBIN TOTAL: 0.2 mg/dL (ref 0.0–1.2)
BUN/Creatinine Ratio: 14 (ref 10–24)
BUN: 14 mg/dL (ref 8–27)
CHLORIDE: 101 mmol/L (ref 96–106)
CO2: 24 mmol/L (ref 20–29)
CREATININE: 1.02 mg/dL (ref 0.76–1.27)
Calcium: 10 mg/dL (ref 8.6–10.2)
GFR calc non Af Amer: 76 mL/min/{1.73_m2} (ref >59)
GFR, EST AFRICAN AMERICAN: 88 mL/min/{1.73_m2} (ref >59)
GLUCOSE: 92 mg/dL (ref 65–99)
Globulin, Total: 2.6 g/dL (ref 1.5–4.5)
Potassium: 4.7 mmol/L (ref 3.5–5.2)
Sodium: 140 mmol/L (ref 134–144)
Total Protein: 7 g/dL (ref 6.0–8.5)

## 2018-05-18 LAB — URIC ACID: URIC ACID: 7.6 mg/dL (ref 3.7–8.6)

## 2018-05-31 ENCOUNTER — Ambulatory Visit: Payer: PPO | Admitting: Family Medicine

## 2018-06-07 DIAGNOSIS — E782 Mixed hyperlipidemia: Secondary | ICD-10-CM | POA: Diagnosis not present

## 2018-06-07 DIAGNOSIS — K219 Gastro-esophageal reflux disease without esophagitis: Secondary | ICD-10-CM | POA: Diagnosis not present

## 2018-06-07 DIAGNOSIS — Z86010 Personal history of colonic polyps: Secondary | ICD-10-CM | POA: Diagnosis not present

## 2018-06-17 ENCOUNTER — Encounter: Payer: Self-pay | Admitting: Family Medicine

## 2018-06-17 DIAGNOSIS — D120 Benign neoplasm of cecum: Secondary | ICD-10-CM | POA: Diagnosis not present

## 2018-06-17 DIAGNOSIS — Z86010 Personal history of colonic polyps: Secondary | ICD-10-CM | POA: Diagnosis not present

## 2018-06-17 DIAGNOSIS — K635 Polyp of colon: Secondary | ICD-10-CM | POA: Diagnosis not present

## 2018-06-17 DIAGNOSIS — D124 Benign neoplasm of descending colon: Secondary | ICD-10-CM | POA: Diagnosis not present

## 2018-06-17 DIAGNOSIS — D122 Benign neoplasm of ascending colon: Secondary | ICD-10-CM | POA: Diagnosis not present

## 2018-06-17 LAB — HM COLONOSCOPY

## 2018-09-17 ENCOUNTER — Other Ambulatory Visit: Payer: Self-pay | Admitting: Family Medicine

## 2018-09-20 NOTE — Telephone Encounter (Signed)
[facility] is requesting to fill pt effexor. Please advise K[facility] 

## 2018-09-22 NOTE — Progress Notes (Signed)
Subjective:   Harold Day was seen in consultation in the movement disorder clinic at the request of Denita Lung, MD.  The evaluation is for tremor and hyperreflexia.  Tremor started approximately 4-5 months ago and involves the bilateral UE.  Tremor is most noticeable when he eats or is in social situations.  He never notes it at rest.   There is no family hx of tremor.  Neuroimaging of the brain has not previously been performed  Affected by caffeine:  No. (2 small cups per day) Affected by alcohol:  Doesn't drink (hx of EtOHism) Affected by stress:  Doesn't have lots of stress but thinks so Affected by fatigue:  Yes.   Spills soup if on spoon:  May or may not Spills glass of liquid if full:  No. Affects ADL's (tying shoes, brushing teeth, etc):  No.  Current/Previously tried tremor medications: none  Current medications that may exacerbate tremor:  N/a (just added Viibryd but tremor was there first)  Outside reports reviewed: lab reports, office notes and referral letter/letters.  C/o spells where "loses vision" and becomes blurry.  If he closes an eye it is better.  It only lasts minute or two.  He feels near syncopal but no palpitations.  No curtain over eye.  No lateralizing weakness/paresthesia.     09/25/17 update: Patient seen today in follow-up.  I have not seen him in about a year.  I have reviewed records available to me since last visit.  He was given propranolol, 20 mg, to use on as needed basis for tremor.  He called me in January, 2018 to state that he did not use the medication very much, but he did find it helpful when he used it.  He uses it 2-3 times per week.   It helps.  Its worse with stress. He has no SE.  He had shoulder surgery on the L.    09/24/18 update: Patient seen today in follow-up for tremor.  He is on propranolol, 20 mg on an as-needed basis.  He takes it 3-4 times per week.  It works well.  No SE.    No Known Allergies  Outpatient Encounter  Medications as of 09/24/2018  Medication Sig  . ALPRAZolam (XANAX) 0.25 MG tablet Take 1 tablet (0.25 mg total) by mouth 2 (two) times daily as needed for anxiety.  Marland Kitchen aspirin 81 MG tablet Take 81 mg by mouth daily.  . colchicine 0.6 MG tablet Take 2 tablets (1.2 mg) by mouth x 1 dose, then 1 tablet (0.6 mg) one hour later x 1 dose.  . esomeprazole (NEXIUM) 20 MG capsule Take 20 mg by mouth daily at 12 noon.  . fish oil-omega-3 fatty acids 1000 MG capsule Take 2 g by mouth daily.  Marland Kitchen ibuprofen (ADVIL,MOTRIN) 200 MG tablet Take 600 mg by mouth every 4 (four) hours.  . Multiple Vitamins-Minerals (MULTIVITAMIN WITH MINERALS) tablet Take 1 tablet by mouth daily.  . propranolol (INDERAL) 20 MG tablet Take 1 tablet (20 mg total) by mouth daily as needed.  . triamcinolone (NASACORT AQ) 55 MCG/ACT nasal inhaler Place 2 sprays into the nose daily.  Marland Kitchen venlafaxine (EFFEXOR) 75 MG tablet TAKE 1 TABLET BY MOUTH TWICE DAILY   No facility-administered encounter medications on file as of 09/24/2018.     Past Medical History:  Diagnosis Date  . Alcoholic ([place])   . Allergic rhinitis   . Depression   . Former smoker    quit 2011  .  GERD (gastroesophageal reflux disease)   . Hepatitis C    noticed when he tried to give blood; never had illness    Past Surgical History:  Procedure Laterality Date  . [place] STUDY N/A 09/18/2014   Procedure: [place] STUDY;  Surgeon: Beryle Beams, MD;  Location: [facility];  Service: Endoscopy;  Laterality: N/A;  . COLONOSCOPY    . ESOPHAGEAL MANOMETRY N/A 09/18/2014   Procedure: ESOPHAGEAL MANOMETRY (EM);  Surgeon: Beryle Beams, MD;  Location: [facility];  Service: Endoscopy;  Laterality: N/A;  . SHOULDER ARTHROSCOPY Left 04/2017  . VASECTOMY      Social History   Socioeconomic History  . Marital status: Married    Spouse name: Not on file  . Number of children: 2  . Years of education: Not on file  . Highest education level: Not on file    Occupational History  . Occupation: retired (Counsellor)    Employer: [employer]  Social Needs  . Financial resource strain: Not on file  . Food insecurity:    Worry: Not on file    Inability: Not on file  . Transportation needs:    Medical: Not on file    Non-medical: Not on file  Tobacco Use  . Smoking status: Former Smoker    Last attempt to quit: 07/05/2011    Years since quitting: 7.2  . Smokeless tobacco: Current User    Types: Snuff  Substance and Sexual Activity  . Alcohol use: No    Comment: no alcohol since about 2000  . Drug use: No  . Sexual activity: Yes  Lifestyle  . Physical activity:    Days per week: Not on file    Minutes per session: Not on file  . Stress: Not on file  Relationships  . Social connections:    Talks on phone: Not on file    Gets together: Not on file    Attends religious service: Not on file    Active member of club or organization: Not on file    Attends meetings of clubs or organizations: Not on file    Relationship status: Not on file  . Intimate partner violence:    Fear of current or ex partner: Not on file    Emotionally abused: Not on file    Physically abused: Not on file    Forced sexual activity: Not on file  Other Topics Concern  . Not on file  Social History Narrative  . Not on file    Family Status  Relation Name Status  . Mother  Deceased  . Father  Deceased  . Sister  Alive  . Son 2 Alive    Review of Systems A complete 10 system ROS was obtained and was negative apart from what is mentioned.   Objective:   VITALS:   Vitals:   09/24/18 0827  BP: 140/80  Pulse: 68  SpO2: 96%  Weight: 169 lb (76.7 kg)  Height: 5' 8.5" (1.74 m)   Gen:  Appears stated age and in NAD. HEENT:  Normocephalic, atraumatic. The mucous membranes are moist. The superficial temporal arteries are without ropiness or tenderness. Cardiovascular: Regular rate and rhythm. Lungs: Clear to auscultation  bilaterally. Neck: there is a L carotid bruit  NEUROLOGICAL:  Orientation:  The patient is alert and oriented x 3.  Cranial nerves: There is good facial symmetry. Speech is fluent and clear. Soft palate rises symmetrically and there is no tongue deviation.  Hearing is intact to conversational tone. Tone: Tone is good throughout. Sensation: Sensation is intact to light touch  Coordination:  The patient has no difficulty with RAM's or FNF bilaterally. Motor: Strength is 5/5 in the bilateral upper and lower extremities.  Shoulder shrug is equal bilaterally.  There is no pronator drift.  There are no fasciculations noted. Gait and Station: The patient is able to ambulate without difficulty. The patient is able to heel toe walk without any difficulty. The patient is able to ambulate in a tandem fashion. The patient is able to stand in the Romberg position. Abnormal movement:  There is minimal tremor noted of the outstretched hands.  It really does not change of given a weight.  He has some tremor with Archimedes spirals bilaterally.  He has an irregular tremor when asked to pour water from one glass to another, especially on the left.   LABS:  Lab Results  Component Value Date   TSH 1.23 09/22/2016     Chemistry      Component Value Date/Time   NA 140 05/17/2018 0954   K 4.7 05/17/2018 0954   CL 101 05/17/2018 0954   CO2 24 05/17/2018 0954   BUN 14 05/17/2018 0954   CREATININE 1.02 05/17/2018 0954   CREATININE 1.20 09/22/2016 0820      Component Value Date/Time   CALCIUM 10.0 05/17/2018 0954   ALKPHOS 79 05/17/2018 0954   AST 16 05/17/2018 0954   ALT 19 05/17/2018 0954   BILITOT 0.2 05/17/2018 0954     No results found for: HGBA1C  No results found for: VITAMINB12      Assessment/Plan:   1.  Essential Tremor.  -He is doing well with propranolol 20 mg prn, 3-4 days per week.  Risks, benefits, side effects and alternative therapies were discussed.  The opportunity to ask  questions was given and they were answered to the best of my ability.  The patient expressed understanding and willingness to follow the outlined treatment protocols.  2.  L carotid bruit  -been 2 years since last u/s.  Will re-order.    3.  F/u one year   CC:  Denita Lung, MD

## 2018-09-24 ENCOUNTER — Encounter: Payer: Self-pay | Admitting: Neurology

## 2018-09-24 ENCOUNTER — Ambulatory Visit (INDEPENDENT_AMBULATORY_CARE_PROVIDER_SITE_OTHER): Payer: PPO | Admitting: Neurology

## 2018-09-24 VITALS — BP 140/80 | HR 68 | Ht 68.5 in | Wt 169.0 lb

## 2018-09-24 DIAGNOSIS — G250 Essential tremor: Secondary | ICD-10-CM | POA: Diagnosis not present

## 2018-09-24 DIAGNOSIS — R0989 Other specified symptoms and signs involving the circulatory and respiratory systems: Secondary | ICD-10-CM | POA: Diagnosis not present

## 2018-09-24 MED ORDER — PROPRANOLOL HCL 20 MG PO TABS: 20.0000 mg | ORAL_TABLET | Freq: Every day | ORAL | 1 refills | Status: DC | PRN

## 2018-09-24 NOTE — Patient Instructions (Signed)
1. We have you scheduled for your Carotid Doppler on 09/27/2018 at 3:00 pm. Please arrive 15 minutes prior and go to first floor radiology at [facility]. If this is not a good date/time please call [phone] to reschedule.

## 2018-09-27 ENCOUNTER — Ambulatory Visit (HOSPITAL_COMMUNITY)
Admission: RE | Admit: 2018-09-27 | Discharge: 2018-09-27 | Disposition: A | Discharge status: Home / Self Care | Payer: PPO | Source: Ambulatory Visit | Attending: Neurology | Admitting: Neurology

## 2018-09-27 DIAGNOSIS — R0989 Other specified symptoms and signs involving the circulatory and respiratory systems: Secondary | ICD-10-CM | POA: Insufficient documentation

## 2018-09-28 ENCOUNTER — Telehealth: Payer: Self-pay | Admitting: Neurology

## 2018-09-28 NOTE — Telephone Encounter (Signed)
-----   Message from [place], DO sent at 09/28/2018  7:36 AM EST ----- Let pt know that carotid u/s is normal

## 2018-09-28 NOTE — Telephone Encounter (Signed)
Mychart message sent to patient.

## 2018-10-06 ENCOUNTER — Encounter: Payer: Self-pay | Admitting: Family Medicine

## 2018-10-06 ENCOUNTER — Ambulatory Visit (INDEPENDENT_AMBULATORY_CARE_PROVIDER_SITE_OTHER): Payer: PPO | Admitting: Family Medicine

## 2018-10-06 VITALS — BP 128/82 | HR 92 | Temp 97.5°F | Ht 69.0 in | Wt 166.2 lb

## 2018-10-06 DIAGNOSIS — F341 Dysthymic disorder: Secondary | ICD-10-CM | POA: Diagnosis not present

## 2018-10-06 DIAGNOSIS — G250 Essential tremor: Secondary | ICD-10-CM

## 2018-10-06 DIAGNOSIS — N401 Enlarged prostate with lower urinary tract symptoms: Secondary | ICD-10-CM | POA: Diagnosis not present

## 2018-10-06 DIAGNOSIS — E785 Hyperlipidemia, unspecified: Secondary | ICD-10-CM

## 2018-10-06 DIAGNOSIS — Z0000 Encounter for general adult medical examination without abnormal findings: Secondary | ICD-10-CM

## 2018-10-06 DIAGNOSIS — K219 Gastro-esophageal reflux disease without esophagitis: Secondary | ICD-10-CM | POA: Diagnosis not present

## 2018-10-06 DIAGNOSIS — F1021 Alcohol dependence, in remission: Secondary | ICD-10-CM | POA: Diagnosis not present

## 2018-10-06 DIAGNOSIS — L821 Other seborrheic keratosis: Secondary | ICD-10-CM

## 2018-10-06 DIAGNOSIS — Z86010 Personal history of colonic polyps: Secondary | ICD-10-CM | POA: Diagnosis not present

## 2018-10-06 DIAGNOSIS — Z8619 Personal history of other infectious and parasitic diseases: Secondary | ICD-10-CM | POA: Diagnosis not present

## 2018-10-06 DIAGNOSIS — J3089 Other allergic rhinitis: Secondary | ICD-10-CM

## 2018-10-06 DIAGNOSIS — J302 Other seasonal allergic rhinitis: Secondary | ICD-10-CM

## 2018-10-06 LAB — LIPID PANEL
CHOLESTEROL TOTAL: 249 mg/dL — AB (ref 100–199)
Chol/HDL Ratio: 6.7 ratio — ABNORMAL HIGH (ref 0.0–5.0)
HDL: 37 mg/dL — ABNORMAL LOW (ref >39)
LDL Calculated: 137 mg/dL — ABNORMAL HIGH (ref 0–99)
Triglycerides: 375 mg/dL — ABNORMAL HIGH (ref 0–149)
VLDL CHOLESTEROL CAL: 75 mg/dL — AB (ref 5–40)

## 2018-10-06 LAB — CBC WITH DIFFERENTIAL/PLATELET
BASOS ABS: 0.1 10*3/uL (ref 0.0–0.2)
BASOS: 1 %
EOS (ABSOLUTE): 0.2 10*3/uL (ref 0.0–0.4)
Eos: 2 %
Hematocrit: 45.2 % (ref 37.5–51.0)
Hemoglobin: 15.2 g/dL (ref 13.0–17.7)
IMMATURE GRANS (ABS): 0.1 10*3/uL (ref 0.0–0.1)
Immature Granulocytes: 1 %
LYMPHS ABS: 2.4 10*3/uL (ref 0.7–3.1)
LYMPHS: 25 %
MCH: 31.1 pg (ref 26.6–33.0)
MCHC: 33.6 g/dL (ref 31.5–35.7)
MCV: 92 fL (ref 79–97)
Monocytes Absolute: 0.8 10*3/uL (ref 0.1–0.9)
Monocytes: 8 %
NEUTROS ABS: 6.1 10*3/uL (ref 1.4–7.0)
Neutrophils: 63 %
Platelets: 275 10*3/uL (ref 150–450)
RBC: 4.89 x10E6/uL (ref 4.14–5.80)
RDW: 13 % (ref 12.3–15.4)
WBC: 9.6 10*3/uL (ref 3.4–10.8)

## 2018-10-06 MED ORDER — VENLAFAXINE HCL 75 MG PO TABS: 75.0000 mg | ORAL_TABLET | Freq: Two times a day (BID) | ORAL | 3 refills | Status: DC

## 2018-10-06 NOTE — Progress Notes (Signed)
Harold Day is a 67 y.o. male who presents for annual wellness visit,CPE and follow-up on chronic medical conditions.  He does have a slight tremor and does use tramadol for that with good results.  He has very little difficulty from BPH.  He continues on his Effexor and has no interest in stopping this.  His reflux is under good control.  He has a previous history of hepatitis C however he was treated and his last viral loads were undetectable.  He does have a history of colonic polyps and is in routine surveillance mode.  His allergies are under good control.  He remains alcohol free.  He does have history of hyperlipidemia.  His marriage is going well.   Immunizations and Health Maintenance Immunization History  Administered Date(s) Administered  . DT 02/01/1996  . Hepatitis A, Adult 07/05/2015, 08/06/2015  . Influenza Split 09/08/2002, 07/23/2006  . Influenza, High Dose Seasonal PF 07/17/2016, 08/20/2017  . Influenza,inj,Quad PF,6+ Mos 07/05/2015  . Pneumococcal Conjugate-13 07/17/2016  . Pneumococcal Polysaccharide-23 08/20/2017  . Td 07/23/2006  . Tdap 07/17/2016  . Zoster 03/08/2012  . Zoster Recombinat (Shingrix) 08/25/2017   Health Maintenance Due  Topic Date Due  . INFLUENZA VACCINE  05/20/2018    Last colonoscopy: 06/17/18 Last PSA:07/17/16 Dentist: q six months Ophtho: 07/2018 Exercise: [employer] five days a week, weights and walking  Other doctors caring for patient include: [facility], Dr. Lorretta Harp dentist, Dr. Petra Kuba.  Advanced Directives: Yes.  Copy asked for.   Depression screen:  See questionnaire below.     Depression screen [facility] 2/9 08/20/2017 07/17/2016 02/12/2016 02/12/2016 02/12/2016  Decreased Interest 0 1 3 2 3   Down, Depressed, Hopeless 0 1 3 3 3   PHQ - 2 Score 0 2 6 5 6   Altered sleeping - - 3 2 -  Tired, decreased energy - - 3 3 -  Change in appetite - - 1 0 -  Feeling bad or failure about yourself  - - 2 3 -  Trouble concentrating - - 3 3 -  Moving  slowly or fidgety/restless - - 2 3 -  Suicidal thoughts - - 1 1 -  PHQ-9 Score - - 21 20 -  Difficult doing work/chores - - Very difficult Very difficult -    Fall Screen: See Questionaire below.   Fall Risk  09/24/2018 08/20/2017 09/25/2016 07/17/2016 07/05/2015  Falls in the past year? 1 No No No No  Number falls in past yr: 1 - - - -  Injury with Fall? 0 - - - -  Risk for fall due to : History of fall(s);Impaired balance/gait - - - -  Follow up Falls evaluation completed - - - -    ADL screen:  See questionnaire below.  Functional Status Survey:     Review of Systems  Constitutional: -, -unexpected weight change, -anorexia, -fatigue Allergy: -sneezing, -itching, -congestion Dermatology: denies changing moles, rash, lumps ENT: -runny nose, -ear pain, -sore throat,  Cardiology:  -chest pain, -palpitations, -orthopnea, Respiratory: -cough, -shortness of breath, -dyspnea on exertion, -wheezing,  Gastroenterology: -abdominal pain, -nausea, -vomiting, -diarrhea, -constipation, -dysphagia Hematology: -bleeding or bruising problems Musculoskeletal: -arthralgias, -myalgias, -joint swelling, -back pain, - Ophthalmology: -vision changes,  Urology: -dysuria, -difficulty urinating,  -urinary frequency, -urgency, incontinence Neurology: -, -numbness, , -memory loss, -falls, -dizziness    PHYSICAL EXAM:   General Appearance: Alert, cooperative, no distress, appears stated age Head: Normocephalic, without obvious abnormality, atraumatic Eyes: PERRL, conjunctiva/corneas clear, EOM's intact, fundi benign Ears:  Normal TM's and external ear canals Nose: Nares normal, mucosa normal, no drainage or sinus   tenderness Throat: Lips, mucosa, and tongue normal; teeth and gums normal Neck: Supple, no lymphadenopathy, thyroid:no enlargement/tenderness/nodules; no carotid bruit or JVD Lungs: Clear to auscultation bilaterally without wheezes, rales or ronchi; respirations unlabored Heart: Regular  rate and rhythm, S1 and S2 normal, no murmur, rub or gallop Abdomen: Soft, non-tender, nondistended, normoactive bowel sounds, no masses, no hepatosplenomegaly Extremities: No clubbing, cyanosis or edema Pulses: 2+ and symmetric all extremities Skin: Skin color, texture, turgor normal, no rashes multiple round discrete pigmented lesions are noted.   Lymph nodes: Cervical, supraclavicular, and axillary nodes normal Neurologic: CNII-XII intact, normal strength, sensation and gait; reflexes 2+ and symmetric throughout   Psych: Normal mood, affect, hygiene and grooming  ASSESSMENT/PLAN: Routine general medical examination at a health care facility - Plan: CBC with Differential/Platelet, Lipid panel  Benign essential tremor  Benign prostatic hyperplasia with lower urinary tract symptoms, symptom details unspecified  Dysthymia  Gastroesophageal reflux disease without esophagitis - Plan: CBC with Differential/Platelet  History of hepatitis C  Hx of colonic polyps - Plan: CBC with Differential/Platelet  Perennial allergic rhinitis with seasonal variation  Recovering alcoholic (HCC)  Seborrheic keratosis  Hyperlipidemia, unspecified hyperlipidemia type - Plan: Lipid panel He will continue on his present medication regimen.  Encouraged him to remain physically active.  Of note interestingly he uses Inderal to help with his golf game as it does slow his motion down to a more manageable level.  Immunization recommendations discussed.  Colonoscopy recommendations reviewed.   Medicare Attestation I have personally reviewed: The patient's medical and social history Their use of alcohol, tobacco or illicit drugs Their current medications and supplements The patient's functional ability including ADLs,fall risks, home safety risks, cognitive, and hearing and visual impairment Diet and physical activities Evidence for depression or mood disorders  The patient's weight, height, and BMI have  been recorded in the chart.  I have made referrals, counseling, and provided education to the patient based on review of the above and I have provided the patient with a written personalized care plan for preventive services.     Jill Alexanders, MD   10/06/2018

## 2018-10-20 DIAGNOSIS — H2513 Age-related nuclear cataract, bilateral: Secondary | ICD-10-CM | POA: Diagnosis not present

## 2018-10-20 DIAGNOSIS — H40033 Anatomical narrow angle, bilateral: Secondary | ICD-10-CM | POA: Diagnosis not present

## 2018-10-26 ENCOUNTER — Telehealth: Payer: Self-pay

## 2018-10-26 NOTE — Telephone Encounter (Signed)
Patient called to inform us that the mole on his face didn't go away and he would like to be referred to a Dermatologist. Please advise.

## 2018-10-27 NOTE — Telephone Encounter (Signed)
ok 

## 2018-10-29 ENCOUNTER — Other Ambulatory Visit: Payer: Self-pay

## 2018-10-29 DIAGNOSIS — D229 Melanocytic nevi, unspecified: Secondary | ICD-10-CM

## 2018-10-29 NOTE — Telephone Encounter (Signed)
Order in and info Faxed over. [place]

## 2018-12-27 ENCOUNTER — Ambulatory Visit (INDEPENDENT_AMBULATORY_CARE_PROVIDER_SITE_OTHER): Payer: PPO | Admitting: Family Medicine

## 2018-12-27 ENCOUNTER — Encounter: Payer: Self-pay | Admitting: Family Medicine

## 2018-12-27 VITALS — BP 122/82 | HR 67 | Temp 97.9°F | Wt 166.8 lb

## 2018-12-27 DIAGNOSIS — M79621 Pain in right upper arm: Secondary | ICD-10-CM | POA: Diagnosis not present

## 2018-12-27 NOTE — Progress Notes (Signed)
   Subjective:    Patient ID: Harold Day, male    DOB: [birth year], 68 y.o.   MRN: [id number]  HPI He complains of a one-week history of difficulty with right axillary pain.  No history of infection in the arm or shoulder area.  No fever, chills, complaint of other areas swollen or tender.   Review of Systems     Objective:   Physical Exam Alert and in no distress.  Full motion of the arm.  Slight tenderness palpation in the axillary area however the skin is normal and no palpable lesions were noted.       Assessment & Plan:  Axillary pain, right Recommend supportive care with pain medicine as needed and return here if symptoms continue.

## 2018-12-31 ENCOUNTER — Telehealth: Payer: Self-pay | Admitting: Family Medicine

## 2018-12-31 MED ORDER — VENLAFAXINE HCL 75 MG PO TABS: 75.0000 mg | ORAL_TABLET | Freq: Two times a day (BID) | ORAL | 3 refills | Status: DC

## 2018-12-31 NOTE — Telephone Encounter (Signed)
Pt called and stated that he picked up his prescription for venalafaxine and lost it. Pt needs another refill sent to the [facility] on [address]. He is aware that he will have to pay for this medication out of pocket.

## 2019-03-24 ENCOUNTER — Other Ambulatory Visit: Payer: Self-pay | Admitting: Neurology

## 2019-03-24 NOTE — Telephone Encounter (Signed)
Requested Prescriptions   Pending Prescriptions Disp Refills  . propranolol (INDERAL) 20 MG tablet [Pharmacy Med Name: PROPRANOLOL 20MG  TABLETS] 90 tablet 1    Sig: TAKE 1 TABLET(20 MG) BY MOUTH DAILY AS NEEDED    Rx last filled:09/24/18 #90 1 refills  Pt last seen:09/24/18  Follow up appt scheduled:09/26/19

## 2019-04-26 DIAGNOSIS — L820 Inflamed seborrheic keratosis: Secondary | ICD-10-CM | POA: Diagnosis not present

## 2019-04-26 DIAGNOSIS — L821 Other seborrheic keratosis: Secondary | ICD-10-CM | POA: Diagnosis not present

## 2019-04-26 DIAGNOSIS — L570 Actinic keratosis: Secondary | ICD-10-CM | POA: Diagnosis not present

## 2019-04-26 DIAGNOSIS — D1801 Hemangioma of skin and subcutaneous tissue: Secondary | ICD-10-CM | POA: Diagnosis not present

## 2019-04-26 DIAGNOSIS — D225 Melanocytic nevi of trunk: Secondary | ICD-10-CM | POA: Diagnosis not present

## 2019-05-26 ENCOUNTER — Telehealth: Payer: Self-pay | Admitting: Family Medicine

## 2019-05-26 NOTE — Telephone Encounter (Signed)
Find out why he needs a note on certainly okay with him having it

## 2019-05-26 NOTE — Telephone Encounter (Signed)
Pt called and states that he needs a letter stating he can go back to the [facility] on the 11th, and that it helps him with his overall health, pt can be reached at [phone]

## 2019-05-27 ENCOUNTER — Encounter: Payer: Self-pay | Admitting: Family Medicine

## 2019-05-27 NOTE — Telephone Encounter (Signed)
Letter typed & emailed to patient

## 2019-05-27 NOTE — Telephone Encounter (Signed)
Please help. kh

## 2019-07-08 ENCOUNTER — Other Ambulatory Visit: Payer: Self-pay | Admitting: Family Medicine

## 2019-07-11 NOTE — Telephone Encounter (Signed)
[facility] is requesting to fill pt effxor. Please advise [facility] [place]

## 2019-07-22 DIAGNOSIS — R519 Headache, unspecified: Secondary | ICD-10-CM | POA: Diagnosis not present

## 2019-09-14 ENCOUNTER — Other Ambulatory Visit: Payer: Self-pay

## 2019-09-23 ENCOUNTER — Encounter: Payer: Self-pay | Admitting: Neurology

## 2019-09-23 NOTE — Progress Notes (Signed)
Virtual Visit via Video Note The purpose of this virtual visit is to provide medical care while limiting exposure to the novel coronavirus.    Consent was obtained for video visit:  Yes.   Answered questions that patient had about telehealth interaction:  Yes.   I discussed the limitations, risks, security and privacy concerns of performing an evaluation and management service by telemedicine. I also discussed with the patient that there may be a patient responsible charge related to this service. The patient expressed understanding and agreed to proceed.  Pt location: Home Physician Location: office Name of referring provider:  Denita Lung, MD I connected with Harold Day at patients initiation/request on 09/26/2019 at  8:15 AM EST by video enabled telemedicine application and verified that I am speaking with the correct person using two identifiers. Pt MRN:  [id number] Pt DOB:  12-27-1950 Video Participants:  Harold Day;     History of Present Illness:  Patient seen today in follow-up for essential tremor.  He is on propranolol, 20 mg.  He uses that as needed, usually 4-5 days per week (was 3-4 days/week).  He thinks the medication works but noticing more trouble with his golf game because of tremor. No side effects with the medication. Is under a lot of stress. 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  He did have a carotid ultrasound done last visit for left carotid bruit.  This was unremarkable, demonstrating 1 to 39% stenosis bilaterally.  Medical records have been reviewed since last visit.  Last primary care visit was done in March.  Last lab work was done a year ago. Is exercising at the [facility] and states that he keeps close tabs on his blood pressure when he goes there and it has been good.   Current Outpatient Medications on File Prior to Visit  Medication Sig Dispense Refill  . ALPRAZolam (XANAX) 0.25 MG tablet  Take 1 tablet (0.25 mg total) by mouth 2 (two) times daily as needed for anxiety. 10 tablet 0  . aspirin 81 MG tablet Take 81 mg by mouth daily.    . colchicine 0.6 MG tablet Take 2 tablets (1.2 mg) by mouth x 1 dose, then 1 tablet (0.6 mg) one hour later x 1 dose. 20 tablet 0  . esomeprazole (NEXIUM) 20 MG capsule Take 20 mg by mouth daily at 12 noon.    . fish oil-omega-3 fatty acids 1000 MG capsule Take 2 g by mouth daily.    Marland Kitchen ibuprofen (ADVIL,MOTRIN) 200 MG tablet Take 600 mg by mouth every 4 (four) hours.    . Multiple Vitamins-Minerals (MULTIVITAMIN WITH MINERALS) tablet Take 1 tablet by mouth daily.    . propranolol (INDERAL) 20 MG tablet TAKE 1 TABLET(20 MG) BY MOUTH DAILY AS NEEDED 90 tablet 1  . triamcinolone (NASACORT AQ) 55 MCG/ACT nasal inhaler Place 2 sprays into the nose daily. 1 Inhaler 12  . venlafaxine (EFFEXOR) 75 MG tablet TAKE 1 TABLET BY MOUTH TWICE DAILY 180 tablet 1   No current facility-administered medications on file prior to visit.      Observations/Objective:   Vitals:   09/23/19 1528  Weight: 166 lb (75.3 kg)  Height: 5\' 9"  (1.753 m)   GEN:  The patient appears stated age and is in NAD.  Neurological examination:  Orientation: The patient is alert and oriented x3. Cranial nerves: There is good facial symmetry. There is no  facial hypomimia.  The speech is fluent and clear. Soft palate rises symmetrically and there is no tongue deviation. Hearing is intact to conversational tone. Motor: Strength is at least antigravity x 4.   Shoulder shrug is equal and symmetric.  There is no pronator drift.  Movement examination: Tone: unable Abnormal movements: I did not see significant tremor of the outstretched hands, but he does have difficulty with Archimedes spirals bilaterally.  He is left-handed, but the right was actually worse than the left. Coordination:  There is no decremation with RAM's, with hand opening and closing her finger taps bilaterally. Gait and  Station: Not tested  Lab Results  Component Value Date   WBC 9.6 10/06/2018   HGB 15.2 10/06/2018   HCT 45.2 10/06/2018   MCV 92 10/06/2018   PLT 275 10/06/2018     Chemistry      Component Value Date/Time   NA 140 05/17/2018 0954   K 4.7 05/17/2018 0954   CL 101 05/17/2018 0954   CO2 24 05/17/2018 0954   BUN 14 05/17/2018 0954   CREATININE 1.02 05/17/2018 0954   CREATININE 1.20 09/22/2016 0820      Component Value Date/Time   CALCIUM 10.0 05/17/2018 0954   ALKPHOS 79 05/17/2018 0954   AST 16 05/17/2018 0954   ALT 19 05/17/2018 0954   BILITOT 0.2 05/17/2018 0954       Assessment and Plan:   1.  Essential tremor  -Continue propranolol, 20 mg as needed.  He is generally using it 4 to 5 days/week.  I did tell him to try 2 tablets before he golfs, as he did not think that the 1 tablet was helping.  I did tell him to let me know if that does not help, or if he has side effects like lightheadedness or dizziness.  He really does not want to take something every day as he does not think he needs something daily.  -Has not had labs in about a year.  Would like to see him have some lab work.  Stated that he was going to call his primary care physician today to get a physical and labs.  2.  Left carotid bruit  -Carotid ultrasound done in December, 2019 demonstrated 1 to 39% stenosis bilaterally.  Follow Up Instructions:  1 year, but he will call me if he needs me before that and will call me to get me feedback on trying the extra tablet of propranolol before golfing.  -I discussed the assessment and treatment plan with the patient. The patient was provided an opportunity to ask questions and all were answered. The patient agreed with the plan and demonstrated an understanding of the instructions.   The patient was advised to call back or seek an in-person evaluation if the symptoms worsen or if the condition fails to improve as anticipated.    Total Time spent in visit with the  patient was:  15 min, of which more than 50% of the time was spent in counseling re med.   Pt understands and agrees with the plan of care outlined.     Alonza Bogus, DO

## 2019-09-26 ENCOUNTER — Telehealth (INDEPENDENT_AMBULATORY_CARE_PROVIDER_SITE_OTHER): Payer: PPO | Admitting: Neurology

## 2019-09-26 ENCOUNTER — Other Ambulatory Visit: Payer: Self-pay

## 2019-09-26 VITALS — Ht 69.0 in | Wt 166.0 lb

## 2019-09-26 DIAGNOSIS — G250 Essential tremor: Secondary | ICD-10-CM | POA: Diagnosis not present

## 2019-09-26 DIAGNOSIS — R0989 Other specified symptoms and signs involving the circulatory and respiratory systems: Secondary | ICD-10-CM

## 2019-10-19 ENCOUNTER — Other Ambulatory Visit: Payer: Self-pay

## 2019-10-19 MED ORDER — PROPRANOLOL HCL 20 MG PO TABS: ORAL_TABLET | ORAL | 1 refills | Status: DC

## 2019-10-26 ENCOUNTER — Telehealth: Payer: Self-pay | Admitting: Neurology

## 2019-10-26 NOTE — Telephone Encounter (Signed)
Patient called needing to let Dr. Gwyndolyn Kaufman that he is jerking a lot more when trying to sleep. Also, he has noticed issues with his Golf game due to the tremors and spasms.. He said his Tremor seems much worse. He was seen in 09/2019. Please Call. Thank you

## 2019-10-27 MED ORDER — PROPRANOLOL HCL 20 MG PO TABS: 60.0000 mg | ORAL_TABLET | Freq: Every day | ORAL | 1 refills | Status: DC | PRN

## 2019-10-27 NOTE — Telephone Encounter (Signed)
As long as no SE, he can try 3 of the propranolol 30 min to 1 hour before the golf.

## 2019-10-27 NOTE — Telephone Encounter (Signed)
See chart. Has he tried to take extra med when he golfs (2 propranolol) Don't know what jerking in sleep is as I treat him for essential tremor and that doesn't cause that.  Could be benign myoclonus but would f/u with PCP

## 2019-10-27 NOTE — Telephone Encounter (Signed)
Spoke with patient and he is aware and verbalizes understanding

## 2019-10-27 NOTE — Telephone Encounter (Signed)
Patient is going to reach out to his PCP. He is taking the extra medication before golfing as instructed and it is still not helping.

## 2019-10-27 NOTE — Telephone Encounter (Signed)
Called patient and he cut out, feels like he lost signal. Will try to call again later today

## 2019-11-28 ENCOUNTER — Encounter: Payer: Self-pay | Admitting: Family Medicine

## 2019-11-28 ENCOUNTER — Other Ambulatory Visit: Payer: Self-pay

## 2019-11-28 ENCOUNTER — Ambulatory Visit (INDEPENDENT_AMBULATORY_CARE_PROVIDER_SITE_OTHER): Payer: PPO | Admitting: Family Medicine

## 2019-11-28 VITALS — BP 134/88 | HR 74 | Temp 97.7°F | Ht 67.25 in | Wt 162.4 lb

## 2019-11-28 DIAGNOSIS — H9113 Presbycusis, bilateral: Secondary | ICD-10-CM

## 2019-11-28 DIAGNOSIS — Z8739 Personal history of other diseases of the musculoskeletal system and connective tissue: Secondary | ICD-10-CM | POA: Diagnosis not present

## 2019-11-28 DIAGNOSIS — Z8619 Personal history of other infectious and parasitic diseases: Secondary | ICD-10-CM

## 2019-11-28 DIAGNOSIS — F1021 Alcohol dependence, in remission: Secondary | ICD-10-CM | POA: Diagnosis not present

## 2019-11-28 DIAGNOSIS — G250 Essential tremor: Secondary | ICD-10-CM

## 2019-11-28 DIAGNOSIS — F341 Dysthymic disorder: Secondary | ICD-10-CM | POA: Diagnosis not present

## 2019-11-28 DIAGNOSIS — N401 Enlarged prostate with lower urinary tract symptoms: Secondary | ICD-10-CM | POA: Diagnosis not present

## 2019-11-28 DIAGNOSIS — Z0000 Encounter for general adult medical examination without abnormal findings: Secondary | ICD-10-CM | POA: Diagnosis not present

## 2019-11-28 DIAGNOSIS — K219 Gastro-esophageal reflux disease without esophagitis: Secondary | ICD-10-CM | POA: Diagnosis not present

## 2019-11-28 DIAGNOSIS — Z860100 Personal history of colon polyps, unspecified: Secondary | ICD-10-CM

## 2019-11-28 DIAGNOSIS — J3089 Other allergic rhinitis: Secondary | ICD-10-CM

## 2019-11-28 DIAGNOSIS — E739 Lactose intolerance, unspecified: Secondary | ICD-10-CM

## 2019-11-28 DIAGNOSIS — K649 Unspecified hemorrhoids: Secondary | ICD-10-CM

## 2019-11-28 DIAGNOSIS — Z13220 Encounter for screening for lipoid disorders: Secondary | ICD-10-CM | POA: Diagnosis not present

## 2019-11-28 DIAGNOSIS — J302 Other seasonal allergic rhinitis: Secondary | ICD-10-CM

## 2019-11-28 LAB — POCT URINALYSIS DIP (PROADVANTAGE DEVICE)
Bilirubin, UA: NEGATIVE
Blood, UA: NEGATIVE
Glucose, UA: NEGATIVE mg/dL
Ketones, POC UA: NEGATIVE mg/dL
Leukocytes, UA: NEGATIVE
Nitrite, UA: NEGATIVE
Protein Ur, POC: NEGATIVE mg/dL
Specific Gravity, Urine: 1.01
Urobilinogen, Ur: 0.2
pH, UA: 6 (ref 5.0–8.0)

## 2019-11-28 MED ORDER — VENLAFAXINE HCL 75 MG PO TABS: 75.0000 mg | ORAL_TABLET | Freq: Two times a day (BID) | ORAL | 3 refills | Status: DC

## 2019-11-28 NOTE — Patient Instructions (Signed)
  Harold Day , Thank you for taking time to come for your Medicare Wellness Visit. I appreciate your ongoing commitment to your health goals. Please review the following plan we discussed and let me know if I can assist you in the future.   These are the goals we discussed: Avoid milk and milk products. Also use Anusol HC for your hemorrhoids This is a list of the screening recommended for you and due dates:  Health Maintenance  Topic Date Due  . Urine Protein Check  05/04/1961  . Tetanus Vaccine  07/17/2026  . Colon Cancer Screening  06/17/2028  . Flu Shot  Completed  .  Hepatitis C: One time screening is recommended by Center for Disease Control  (CDC) for  adults born from [birth year] through 1965.   Completed  . Pneumonia vaccines  Completed

## 2019-11-28 NOTE — Progress Notes (Signed)
Harold Day is a 69 y.o. male who presents for annual wellness visit,CPE and follow-up on chronic medical conditions.  He has a concern about hearing.  Apparently his wife has noted difficulty with that.  He also now has had difficulty with lactose intolerance stating even half-and-half and coughing causes abdominal bloating and can gas.  He does have a history of reflux separate from the abdominal bloating and gas.  He also has a previous history of gout and does occasionally take colchicine.  He was given this when he was in [place].  He uses that once or twice a year and usually only 1 or 2 tablets.  His allergies are under good control with OTC medications.  He does complain of difficulty with hemorrhoids that intermittently bother him.  He gets regular follow-up concerning his colonic polyps.  He has a history of hepatitis C however he did have therapy in 2016. He continues to do quite well on Effexor which helps keep him psychologically stable.  He has no desire to stop that.  He remains alcohol free.  He sees neurology for his tremor and does use a beta-blocker on an as-needed basis for that.  He does have BPH mainly with hesitancy but is not interested in further intervention for that.  Immunizations and Health Maintenance Immunization History  Administered Date(s) Administered  . DT (Pediatric) 02/01/1996  . Hepatitis A, Adult 07/05/2015, 08/06/2015  . Influenza Split 09/08/2002, 07/23/2006  . Influenza, High Dose Seasonal PF 07/17/2016, 08/20/2017  . Influenza, Quadrivalent, Recombinant, Inj, Pf 07/07/2019  . Influenza,inj,Quad PF,6+ Mos 07/05/2015, 08/21/2018  . Pneumococcal Conjugate-13 07/17/2016  . Pneumococcal Polysaccharide-23 08/20/2017  . Td 07/23/2006  . Tdap 07/17/2016  . Zoster 03/08/2012  . Zoster Recombinat (Shingrix) 08/25/2017, 02/06/2018   Health Maintenance Due  Topic Date Due  . URINE MICROALBUMIN  05/04/1961    Last colonoscopy: 06/17/18 Last PSA:  07/17/16 Dentist: 2/9/ 20 Q six months Ophtho: Two years ago Exercise: weights and walking for about 45 min.  Other doctors caring for patient include: Dr. Carles Collet neurology, Dr. [facility]  Advanced Directives: Not on file information asked for Does Patient Have a Medical Advance Directive?: No Would patient like information on creating a medical advance directive?: Yes (MAU/Ambulatory/Procedural Areas - Information given)  Depression screen:  See questionnaire below.     Depression screen [facility] 2/9 11/28/2019 10/06/2018 08/20/2017 07/17/2016 02/12/2016  Decreased Interest 0 0 0 1 3  Down, Depressed, Hopeless 0 0 0 1 3  PHQ - 2 Score 0 0 0 2 6  Altered sleeping - - - - 3  Tired, decreased energy - - - - 3  Change in appetite - - - - 1  Feeling bad or failure about yourself  - - - - 2  Trouble concentrating - - - - 3  Moving slowly or fidgety/restless - - - - 2  Suicidal thoughts - - - - 1  PHQ-9 Score - - - - 21  Difficult doing work/chores - - - - Very difficult    Fall Screen: See Questionaire below.   Fall Risk  11/28/2019 09/14/2019 10/06/2018 09/24/2018 08/20/2017  Falls in the past year? 0 0 1 1 No  Comment - Emmi Telephone Survey: data to providers prior to load - - -  Number falls in past yr: - - 1 1 -  Injury with Fall? - - 0 0 -  Risk for fall due to : - - - History of fall(s);Impaired balance/gait -  Follow up - - - Falls evaluation completed -    ADL screen:  See questionnaire below.  Functional Status Survey: Is the patient deaf or have difficulty hearing?: Yes Does the patient have difficulty seeing, even when wearing glasses/contacts?: No Does the patient have difficulty concentrating, remembering, or making decisions?: No Does the patient have difficulty walking or climbing stairs?: No Does the patient have difficulty dressing or bathing?: No Does the patient have difficulty doing errands alone such as visiting a doctor's office or shopping?: No   Review of  Systems  Constitutional: -, -unexpected weight change, -anorexia, -fatigue Allergy: -sneezing, -itching, -congestion Dermatology: denies changing moles, rash, lumps ENT: -runny nose, -ear pain, -sore throat,  Cardiology:  -chest pain, -palpitations, -orthopnea, Respiratory: -cough, -shortness of breath, -dyspnea on exertion, -wheezing,  Gastroenterology: -abdominal pain, -nausea, -vomiting, -diarrhea, -constipation, -dysphagia Hematology: -bleeding or bruising problems Musculoskeletal: -arthralgias, -myalgias, -joint swelling, -back pain, - Ophthalmology: -vision changes,  Urology: -dysuria, -difficulty urinating,  -urinary frequency, -urgency, incontinence Neurology: -, -numbness, , -memory loss, -falls, -dizziness    PHYSICAL EXAM:  BP 134/88 (BP Location: Left Arm, Patient Position: Sitting)   Pulse 74   Temp 97.7 F (36.5 C)   Ht 5' 7.25" (1.708 m)   Wt 162 lb 6.4 oz (73.7 kg)   SpO2 98%   BMI 25.25 kg/m   General Appearance: Alert, cooperative, no distress, appears stated age Head: Normocephalic, without obvious abnormality, atraumatic Eyes: PERRL, conjunctiva/corneas clear, EOM's intact,  Ears: Normal TM's and external ear canals Nose: Nares normal, mucosa normal, no drainage or sinus   tenderness Throat: Lips, mucosa, and tongue normal; teeth and gums normal Neck: Supple, no lymphadenopathy, thyroid:no enlargement/tenderness/nodules; no carotid bruit or JVD Lungs: Clear to auscultation bilaterally without wheezes, rales or ronchi; respirations unlabored Heart: Regular rate and rhythm, S1 and S2 normal, no murmur, rub or gallop Abdomen: Soft, non-tender, nondistended, normoactive bowel sounds, no masses, no hepatosplenomegaly Skin: Skin color, texture, turgor normal, no rashes or lesions Lymph nodes: Cervical, supraclavicular, and axillary nodes normal Neurologic: CNII-XII intact, normal strength, sensation and gait; reflexes 2+ and symmetric throughout   Psych: Normal  mood, affect, hygiene and grooming 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 ASSESSMENT/PLAN: Routine general medical examination at a health care facility - Plan: CBC with Differential/Platelet, Comprehensive metabolic panel, Lipid panel  Presbycusis of both ears  Dysthymia - Plan: venlafaxine (EFFEXOR) 75 MG tablet  Hx of colonic polyps  Recovering alcoholic (HCC)  Perennial allergic rhinitis with seasonal variation  History of hepatitis C  Gastroesophageal reflux disease without esophagitis  Benign prostatic hyperplasia with lower urinary tract symptoms, symptom details unspecified  Benign essential tremor  History of gout - Plan: Uric Acid  Lactose intolerance  Screening for lipid disorders - Plan: Lipid panel  Hemorrhoids, unspecified hemorrhoid type I discussed stopping the Effexor however at this point he is not interested in doing that. He will continue to treat the tremor on an as-needed basis. Discussed the lactose intolerance and essentially recommended using Lactaid or possibly just simply avoiding it. Discussed proper care of his hemorrhoids with Anusol HC, fluids and bulk in his diet to keep him regular. Also briefly discussed the potential need for hearing aids at some time in the future. I then discussed the history of gout.  I explained that I am not sure that he was really having gout attacks except for the very first 1.  I will check a uric acid.  At this point do not feel the  need to place him on a urate lowering medication. Continue to treat his allergies as needed.   Immunization recommendations discussed.  Colonoscopy recommendations reviewed.   Medicare Attestation I have personally reviewed: The patient's medical and social history Their use of alcohol, tobacco or illicit drugs Their current medications and supplements The patient's functional ability including ADLs,fall risks, home safety risks, cognitive, and hearing and visual  impairment Diet and physical activities Evidence for depression or mood disorders  The patient's weight, height, and BMI have been recorded in the chart.  I have made referrals, counseling, and provided education to the patient based on review of the above and I have provided the patient with a written personalized care plan for preventive services.     Jill Alexanders, MD   11/28/2019

## 2019-11-28 NOTE — Addendum Note (Signed)
Addended by: Elyse Jarvis on: 11/28/2019 12:37 PM   Modules accepted: Orders

## 2019-11-29 LAB — COMPREHENSIVE METABOLIC PANEL
ALT: 17 IU/L (ref 0–44)
AST: 20 IU/L (ref 0–40)
Albumin/Globulin Ratio: 1.7 (ref 1.2–2.2)
Albumin: 4.5 g/dL (ref 3.8–4.8)
Alkaline Phosphatase: 83 IU/L (ref 39–117)
BUN/Creatinine Ratio: 15 (ref 10–24)
BUN: 17 mg/dL (ref 8–27)
Bilirubin Total: 0.3 mg/dL (ref 0.0–1.2)
CO2: 20 mmol/L (ref 20–29)
Calcium: 10 mg/dL (ref 8.6–10.2)
Chloride: 102 mmol/L (ref 96–106)
Creatinine, Ser: 1.17 mg/dL (ref 0.76–1.27)
GFR calc Af Amer: 74 mL/min/{1.73_m2} (ref >59)
GFR calc non Af Amer: 64 mL/min/{1.73_m2} (ref >59)
Globulin, Total: 2.6 g/dL (ref 1.5–4.5)
Glucose: 92 mg/dL (ref 65–99)
Potassium: 4.9 mmol/L (ref 3.5–5.2)
Sodium: 140 mmol/L (ref 134–144)
Total Protein: 7.1 g/dL (ref 6.0–8.5)

## 2019-11-29 LAB — CBC WITH DIFFERENTIAL/PLATELET
Basophils Absolute: 0.1 10*3/uL (ref 0.0–0.2)
Basos: 1 %
EOS (ABSOLUTE): 0.1 10*3/uL (ref 0.0–0.4)
Eos: 1 %
Hematocrit: 46.7 % (ref 37.5–51.0)
Hemoglobin: 15.9 g/dL (ref 13.0–17.7)
Immature Grans (Abs): 0 10*3/uL (ref 0.0–0.1)
Immature Granulocytes: 0 %
Lymphocytes Absolute: 2.6 10*3/uL (ref 0.7–3.1)
Lymphs: 26 %
MCH: 31.2 pg (ref 26.6–33.0)
MCHC: 34 g/dL (ref 31.5–35.7)
MCV: 92 fL (ref 79–97)
Monocytes Absolute: 0.6 10*3/uL (ref 0.1–0.9)
Monocytes: 6 %
Neutrophils Absolute: 6.4 10*3/uL (ref 1.4–7.0)
Neutrophils: 66 %
Platelets: 244 10*3/uL (ref 150–450)
RBC: 5.09 x10E6/uL (ref 4.14–5.80)
RDW: 12.8 % (ref 11.6–15.4)
WBC: 9.8 10*3/uL (ref 3.4–10.8)

## 2019-11-29 LAB — LIPID PANEL
Chol/HDL Ratio: 6.1 ratio — ABNORMAL HIGH (ref 0.0–5.0)
Cholesterol, Total: 225 mg/dL — ABNORMAL HIGH (ref 100–199)
HDL: 37 mg/dL — ABNORMAL LOW (ref >39)
LDL Chol Calc (NIH): 139 mg/dL — ABNORMAL HIGH (ref 0–99)
Triglycerides: 273 mg/dL — ABNORMAL HIGH (ref 0–149)
VLDL Cholesterol Cal: 49 mg/dL — ABNORMAL HIGH (ref 5–40)

## 2019-11-29 LAB — URIC ACID: Uric Acid: 7.9 mg/dL (ref 3.8–8.4)

## 2019-12-03 ENCOUNTER — Ambulatory Visit: Payer: PPO | Attending: Internal Medicine

## 2019-12-03 DIAGNOSIS — Z23 Encounter for immunization: Secondary | ICD-10-CM | POA: Insufficient documentation

## 2019-12-03 NOTE — Progress Notes (Signed)
   Covid-19 Vaccination Clinic  Name:  Harold Day    MRN: [id number] DOB: 1951-04-26  12/03/2019  Mr. Saadeh was observed post Covid-19 immunization for 15 minutes without incidence. He was provided with Vaccine Information Sheet and instruction to access the V-Safe system.   Mr. Deakins was instructed to call 911 with any severe reactions post vaccine: Marland Kitchen Difficulty breathing  . Swelling of your face and throat  . A fast heartbeat  . A bad rash all over your body  . Dizziness and weakness    Immunizations Administered    Name Date Dose VIS Date Route   Pfizer COVID-19 Vaccine 12/03/2019  9:43 AM 0.3 mL 09/30/2019 Intramuscular   Manufacturer: [place]   Lot: [id number]   [place]: [id number]

## 2019-12-25 ENCOUNTER — Ambulatory Visit: Payer: PPO | Attending: Internal Medicine

## 2019-12-25 DIAGNOSIS — Z23 Encounter for immunization: Secondary | ICD-10-CM | POA: Insufficient documentation

## 2019-12-25 NOTE — Progress Notes (Signed)
   Covid-19 Vaccination Clinic  Name:  Harold Day    MRN: [id number] DOB: [birth year]  12/25/2019  Harold Day was observed post Covid-19 immunization for 15 minutes without incident. He was provided with Vaccine Information Sheet and instruction to access the V-Safe system.   Harold Day was instructed to call 911 with any severe reactions post vaccine: Marland Kitchen Difficulty breathing  . Swelling of face and throat  . A fast heartbeat  . A bad rash all over body  . Dizziness and weakness   Immunizations Administered    Name Date Dose VIS Date Route   Pfizer COVID-19 Vaccine 12/25/2019  2:41 PM 0.3 mL 09/30/2019 Intramuscular   Manufacturer: [place]   Lot: [id number]   [place]: [id number]

## 2020-01-02 DIAGNOSIS — L820 Inflamed seborrheic keratosis: Secondary | ICD-10-CM | POA: Diagnosis not present

## 2020-01-02 DIAGNOSIS — D485 Neoplasm of uncertain behavior of skin: Secondary | ICD-10-CM | POA: Diagnosis not present

## 2020-02-23 ENCOUNTER — Telehealth (INDEPENDENT_AMBULATORY_CARE_PROVIDER_SITE_OTHER): Payer: PPO | Admitting: Medical

## 2020-02-23 ENCOUNTER — Other Ambulatory Visit: Payer: Self-pay

## 2020-02-23 ENCOUNTER — Encounter: Payer: Self-pay | Admitting: Medical

## 2020-02-23 VITALS — Wt 160.0 lb

## 2020-02-23 DIAGNOSIS — R112 Nausea with vomiting, unspecified: Secondary | ICD-10-CM | POA: Diagnosis not present

## 2020-02-23 DIAGNOSIS — F430 Acute stress reaction: Secondary | ICD-10-CM | POA: Diagnosis not present

## 2020-02-23 DIAGNOSIS — R195 Other fecal abnormalities: Secondary | ICD-10-CM

## 2020-02-23 MED ORDER — PROMETHAZINE HCL 25 MG PO TABS: 25.0000 mg | ORAL_TABLET | Freq: Four times a day (QID) | ORAL | 0 refills | Status: AC | PRN

## 2020-02-23 MED ORDER — LOPERAMIDE HCL 2 MG PO TABS: 2.0000 mg | ORAL_TABLET | Freq: Every evening | ORAL | 0 refills | Status: AC | PRN

## 2020-02-23 NOTE — Patient Instructions (Signed)
Recommendations You can use Promethazine /phenergan tablet 1/2 - 1 tablet as needed for nausea and vomiting.  Caution as this can make you sleepy.   Definitely take one before bedtime the next 3 nights  Begin Imodium, 1 tablet at bedtime the next 3 nights, then as needed over the next week  Change your Nexium to 30-45 minutes prior to breakfast.   Go ahead and double up to 2 Nexium 20mg  or 40mg  today daily for the next 1.5 weeks  Avoid heavy meals, spicy foods, greasy foods.   Keep foods small portio and bland the next several days  Drink clear fluids through the day  If not much improved in the next 3-4 days, call back.   If way worse in the coming days such as worsening abdominal pain, blood in stool or vomit, fever, unable to keep anything down, etc., call or go to the emergency department

## 2020-02-23 NOTE — Progress Notes (Signed)
Subjective:     Patient ID: Harold Day, male   DOB: 01/08/1951, 68 y.o.   MRN: [id number]  This visit type was conducted due to national recommendations for restrictions regarding the COVID-19 Pandemic (e.g. social distancing) in an effort to limit this patient's exposure and mitigate transmission in our community.  Due to their co-morbid illnesses, this patient is at least at moderate risk for complications without adequate follow up.  This format is felt to be most appropriate for this patient at this time.    Documentation for virtual audio and video telecommunications through Zoom encounter:  The patient was located at home. The provider was located in the office. The patient did consent to this visit and is aware of possible charges through their insurance for this visit.  The other persons participating in this telemedicine service were none. Time spent on call was 20 minutes and in review of previous records 20 minutes total.  This virtual service is not related to other E/M service within previous 7 days.   HPI Chief Complaint  Patient presents with  . sick    vomitting and diarrhea, been going on 4-5 days.    Virtual consult for not feeling well.  He notes 4-5 days has felt fine, but about 10pm has awaken for with vomiting and diarrhea.  Having 2-3 episodes of vomiting and diarrhea each night.   Not having BM in the day.  During the day not having diarrhea or nausea or vomiting.  Denies body aches, chills, fevers.  having some headaches.  Diarrhea is watery and brown.   Vomits until nothing left.  Uses extra strength tylenol and that helps the headaches.  Not using anything for vomiting and diarrhea.   Goes to bed about 8:30pm.   Eats about around 6pm.   Not eating or drinking within hour of bedtime.   No recent alcohol.  Ate same thing as other family members in the house and no one else is sick with same symptoms.  No recent medication changes.  No new animal or pet  exposure.   Has a 64yo dog.  Vomit seems reddish but doesn't taste blood.   Has hx/o hepatitis C and treated with Harvoni several years ago.   No recent respiratory symptom, no cough, sore throat, sob, congestion.    Using Nexium daily.  Using Effexor daily.  No change in medications or compliance.    No CP, no SOB, no dyspnea.   He does note recent stress.  His 48 yo son with bipolar has been lax about taking his medicaiton causing some stress in the household. He lives with him in recent months due to covid restrictions, and him (son)  being out of work  Past Medical History:  Diagnosis Date  . Alcoholic ([place])   . Allergic rhinitis   . Depression   . Former smoker    quit 2011  . GERD (gastroesophageal reflux disease)   . Hepatitis C    noticed when he tried to give blood; never had illness   Current Outpatient Medications on File Prior to Visit  Medication Sig Dispense Refill  . aspirin 81 MG tablet Take 81 mg by mouth daily.    . colchicine 0.6 MG tablet Take 2 tablets (1.2 mg) by mouth x 1 dose, then 1 tablet (0.6 mg) one hour later x 1 dose. 20 tablet 0  . esomeprazole (NEXIUM) 20 MG capsule Take 20 mg by mouth daily at 12 noon.    Marland Kitchen  fish oil-omega-3 fatty acids 1000 MG capsule Take 2 g by mouth daily.    Marland Kitchen ibuprofen (ADVIL,MOTRIN) 200 MG tablet Take 600 mg by mouth every 4 (four) hours.    . Multiple Vitamins-Minerals (MULTIVITAMIN WITH MINERALS) tablet Take 1 tablet by mouth daily.    . propranolol (INDERAL) 20 MG tablet Take 3 tablets (60 mg total) by mouth daily as needed. 90 tablet 1  . triamcinolone (NASACORT AQ) 55 MCG/ACT nasal inhaler Place 2 sprays into the nose daily. 1 Inhaler 12  . venlafaxine (EFFEXOR) 75 MG tablet Take 1 tablet (75 mg total) by mouth 2 (two) times daily. 180 tablet 3   No current facility-administered medications on file prior to visit.      Review of Systems As in subjective    Objective:   Physical Exam Due to coronavirus pandemic  stay at home measures, patient visit was virtual and they were not examined in person.   Gen: wd, wn, nad      Assessment:     Encounter Diagnoses  Name Primary?  . Loose stools Yes  . Nausea and vomiting, intractability of vomiting not specified, unspecified vomiting type   . Acute stress reaction        Plan:     Discussed limitations of virtual visit.  Discussed possible causes including gastroenteritis, infection, inflammation, stress, or other.   I suspect his stress and anxiety, particularly at bedtime is contributing.   Discussed following recommendations.   advised counseling.  Advised he work with son to ensure medicaiton compliance along with son's psychiatrist.   Patient Instructions  Recommendations You can use Promethazine /phenergan tablet 1/2 - 1 tablet as needed for nausea and vomiting.  Caution as this can make you sleepy.   Definitely take one before bedtime the next 3 nights  Begin Imodium, 1 tablet at bedtime the next 3 nights, then as needed over the next week  Change your Nexium to 30-45 minutes prior to breakfast.   Go ahead and double up to 2 Nexium 20mg  or 40mg  today daily for the next 1.5 weeks  Avoid heavy meals, spicy foods, greasy foods.   Keep foods small portio and bland the next several days  Drink clear fluids through the day  If not much improved in the next 3-4 days, call back.   If way worse in the coming days such as worsening abdominal pain, blood in stool or vomit, fever, unable to keep anything down, etc., call or go to the emergency department      Shown was seen today for sick.  Diagnoses and all orders for this visit:  Loose stools  Nausea and vomiting, intractability of vomiting not specified, unspecified vomiting type  Acute stress reaction  Other orders -     promethazine (PHENERGAN) 25 MG tablet; Take 1 tablet (25 mg total) by mouth every 6 (six) hours as needed for nausea or vomiting. -     loperamide (IMODIUM A-D) 2  MG tablet; Take 1 tablet (2 mg total) by mouth at bedtime as needed for diarrhea or loose stools.  f/u in 4-5 days if not improving or worse

## 2020-02-23 NOTE — Progress Notes (Signed)
done

## 2020-03-30 ENCOUNTER — Ambulatory Visit (INDEPENDENT_AMBULATORY_CARE_PROVIDER_SITE_OTHER): Payer: PPO | Admitting: Family Medicine

## 2020-03-30 ENCOUNTER — Encounter: Payer: Self-pay | Admitting: Family Medicine

## 2020-03-30 ENCOUNTER — Other Ambulatory Visit: Payer: Self-pay

## 2020-03-30 VITALS — BP 130/80 | HR 76 | Temp 97.3°F | Wt 164.2 lb

## 2020-03-30 DIAGNOSIS — M7742 Metatarsalgia, left foot: Secondary | ICD-10-CM

## 2020-03-30 DIAGNOSIS — L84 Corns and callosities: Secondary | ICD-10-CM | POA: Diagnosis not present

## 2020-03-30 NOTE — Progress Notes (Signed)
   Subjective:    Patient ID: Harold Day, male    DOB: 10/21/1950, 69 y.o.   MRN: [id number]  HPI He is here for consult concerning possible corn on the left foot.  It is starting to bother him while walking playing golf.   Review of Systems     Objective:   Physical Exam Exam of the left foot does show a small callus over the medial aspect of the first MTP.  Slight tenderness palpation over the metatarsal head.       Assessment & Plan:  Callus of foot  Metatarsalgia of left foot Discussed proper care of his foot in regard to arch supports, wearing shoes that are more loosely fitting across the metatarsals and possibly padding the callus area.  If continued difficulty may consider referral to podiatry.  He was comfortable with that.

## 2020-04-24 ENCOUNTER — Other Ambulatory Visit: Payer: Self-pay

## 2020-04-24 MED ORDER — PROPRANOLOL HCL 20 MG PO TABS: 60.0000 mg | ORAL_TABLET | Freq: Every day | ORAL | 1 refills | Status: DC | PRN

## 2020-04-24 NOTE — Telephone Encounter (Signed)
Rx(s) sent to pharmacy electronically.  

## 2020-05-30 ENCOUNTER — Other Ambulatory Visit: Payer: Self-pay

## 2020-05-30 MED ORDER — PROPRANOLOL HCL 20 MG PO TABS: 60.0000 mg | ORAL_TABLET | Freq: Every day | ORAL | 0 refills | Status: DC | PRN

## 2020-05-30 NOTE — Telephone Encounter (Signed)
Rx(s) sent to pharmacy electronically.  

## 2020-06-05 ENCOUNTER — Ambulatory Visit (INDEPENDENT_AMBULATORY_CARE_PROVIDER_SITE_OTHER): Payer: PPO | Admitting: Family Medicine

## 2020-06-05 ENCOUNTER — Encounter: Payer: Self-pay | Admitting: Family Medicine

## 2020-06-05 ENCOUNTER — Other Ambulatory Visit: Payer: Self-pay

## 2020-06-05 VITALS — BP 138/82 | HR 76 | Temp 96.3°F | Wt 166.0 lb

## 2020-06-05 DIAGNOSIS — E739 Lactose intolerance, unspecified: Secondary | ICD-10-CM

## 2020-06-05 DIAGNOSIS — R112 Nausea with vomiting, unspecified: Secondary | ICD-10-CM | POA: Diagnosis not present

## 2020-06-05 DIAGNOSIS — M7062 Trochanteric bursitis, left hip: Secondary | ICD-10-CM | POA: Diagnosis not present

## 2020-06-05 MED ORDER — TRIAMCINOLONE ACETONIDE 40 MG/ML IJ SUSP
40.0000 mg | Freq: Once | INTRAMUSCULAR | Status: AC
  Administered 2020-06-05: 40 mg via INTRAMUSCULAR

## 2020-06-05 MED ORDER — LIDOCAINE HCL 1 % IJ SOLN
10.0000 mL | Freq: Once | INTRAMUSCULAR | Status: AC
  Administered 2020-06-05: 10 mL via INTRADERMAL

## 2020-06-05 NOTE — Progress Notes (Signed)
   Subjective:    Patient ID: Harold Day, male    DOB: 11/29/1950, 69 y.o.   MRN: [id number]  HPI He has a 49-month history of intermittent left hip pain that is now becoming more common.  He was able to walk for a great distance but has noted that the distance has been slowly diminishing.  No history of injury to that.  He does play a lot of golf. He also states that over the last 6 months he has had a monthly bout of excessive eructation, nausea, vomiting, diarrhea with chills but no abdominal pain.  He states that the next day he feels quite fatigued.  He does have a history of lactose intolerance but says that this is a different issue.   Review of Systems     Objective:   Physical Exam Alert and in no distress.  Full motion of the left hip without pain.  Tender to palpation over the left greater trochanter.       Assessment & Plan:  Trochanteric bursitis of left hip  Nausea and vomiting, intractability of vomiting not specified, unspecified vomiting type  Lactose intolerance Discussed treatment of the trochanteric bursitis and we decided to give him an injection.  The point of maximum pain was identified and 3 cc of Xylocaine and 40 mg of Kenalog was injected into that area with good relief of his symptoms. He has a good handle on how to take care of the lactose intolerance. I then discussed the periodic nausea vomiting and diarrhea.  Encouraged him to keep a record of the foods that he ate prior to that to see if it was related to that specifically greasy foods.  He will keep me informed.

## 2020-06-05 NOTE — Addendum Note (Signed)
Addended by: Elyse Jarvis on: 06/05/2020 11:05 AM   Modules accepted: Orders

## 2020-06-23 ENCOUNTER — Ambulatory Visit (INDEPENDENT_AMBULATORY_CARE_PROVIDER_SITE_OTHER): Payer: PPO

## 2020-06-23 ENCOUNTER — Other Ambulatory Visit: Payer: Self-pay

## 2020-06-23 ENCOUNTER — Ambulatory Visit
Admission: EM | Admit: 2020-06-23 | Discharge: 2020-06-23 | Disposition: A | Discharge status: Home / Self Care | Payer: PPO | Attending: Physician Assistant | Admitting: Physician Assistant

## 2020-06-23 DIAGNOSIS — X58XXXA Exposure to other specified factors, initial encounter: Secondary | ICD-10-CM | POA: Diagnosis not present

## 2020-06-23 DIAGNOSIS — S61210A Laceration without foreign body of right index finger without damage to nail, initial encounter: Secondary | ICD-10-CM

## 2020-06-23 DIAGNOSIS — M79644 Pain in right finger(s): Secondary | ICD-10-CM

## 2020-06-23 NOTE — Discharge Instructions (Signed)
X-ray negative for fracture or dislocation.  Finger splint for comfort.  Ice compress, rest, ibuprofen as needed.  This may take a few weeks to completely resolve, but should be feeling better each week.  If no improvement of symptoms, were continued decrease in movement, follow-up with hand orthopedic for further evaluation.

## 2020-06-23 NOTE — ED Provider Notes (Signed)
[facility]    CSN: [id number] Arrival date & time: 06/23/20  1010      History   Chief Complaint Chief Complaint  Patient presents with  . Finger Injury    HPI REBEL WILLCUTT is a 69 y.o. male.   69 year old male comes in for right index finger pain after injury yesterday.  States was loading a box plate on tractor, when it slipped and hit the finger.  There is swelling, pain, wound to the finger.  Minimal pain at rest, worse with palpation and range of motion.  Denies numbness tingling.     Past Medical History:  Diagnosis Date  . Alcoholic ([place])   . Allergic rhinitis   . Depression   . Former smoker    quit 2011  . GERD (gastroesophageal reflux disease)   . Hepatitis C    noticed when he tried to give blood; never had illness    Patient Active Problem List   Diagnosis Date Noted  . Loose stools 02/23/2020  . Nausea and vomiting 02/23/2020  . Acute stress reaction 02/23/2020  . Lactose intolerance 11/28/2019  . History of gout 11/28/2019  . Benign essential tremor 08/20/2017  . Seborrheic keratosis 07/17/2016  . Recovering alcoholic ([place]) 38/88/2800  . BPH (benign prostatic hyperplasia) 03/18/2016  . Depression 03/18/2016  . Hx of colonic polyps 03/08/2012  . GERD (gastroesophageal reflux disease) 03/08/2012  . History of hepatitis C 03/08/2012  . Perennial allergic rhinitis with seasonal variation 03/08/2012    Past Surgical History:  Procedure Laterality Date  . [place] STUDY N/A 09/18/2014   Procedure: [place] STUDY;  Surgeon: Beryle Beams, MD;  Location: [facility];  Service: Endoscopy;  Laterality: N/A;  . COLONOSCOPY    . ESOPHAGEAL MANOMETRY N/A 09/18/2014   Procedure: ESOPHAGEAL MANOMETRY (EM);  Surgeon: Beryle Beams, MD;  Location: [facility];  Service: Endoscopy;  Laterality: N/A;  . SHOULDER ARTHROSCOPY Left 04/2017  . VASECTOMY         Home Medications    Prior to Admission medications   Medication Sig  Start Date End Date Taking? Authorizing Provider  aspirin 81 MG tablet Take 81 mg by mouth daily.   Yes [provider]  esomeprazole (NEXIUM) 20 MG capsule Take 20 mg by mouth daily at 12 noon.   Yes [provider]  propranolol (INDERAL) 20 MG tablet Take 3 tablets (60 mg total) by mouth daily as needed. 05/30/20  Yes Tat, Eustace Quail, DO  venlafaxine (EFFEXOR) 75 MG tablet Take 1 tablet (75 mg total) by mouth 2 (two) times daily. 11/28/19  Yes Denita Lung, MD  colchicine 0.6 MG tablet Take 2 tablets (1.2 mg) by mouth x 1 dose, then 1 tablet (0.6 mg) one hour later x 1 dose. 04/23/18   Henson, Vickie L, NP-C  fish oil-omega-3 fatty acids 1000 MG capsule Take 2 g by mouth daily.    [provider]  ibuprofen (ADVIL,MOTRIN) 200 MG tablet Take 600 mg by mouth every 4 (four) hours.    [provider]  loperamide (IMODIUM A-D) 2 MG tablet Take 1 tablet (2 mg total) by mouth at bedtime as needed for diarrhea or loose stools. 02/23/20   Tysinger, Camelia Eng, PA-C  Multiple Vitamins-Minerals (MULTIVITAMIN WITH MINERALS) tablet Take 1 tablet by mouth daily.    [provider]  promethazine (PHENERGAN) 25 MG tablet Take 1 tablet (25 mg total) by mouth every 6 (six) hours as needed  for nausea or vomiting. Patient not taking: Reported on 06/05/2020 02/23/20   Tysinger, Camelia Eng, PA-C  triamcinolone (NASACORT AQ) 55 MCG/ACT nasal inhaler Place 2 sprays into the nose daily. 05/11/13   Denita Lung, MD    Family History Family History  Problem Relation Age of Onset  . Lung cancer Mother   . Autism Son     Social History Social History   Tobacco Use  . Smoking status: Former Smoker    Quit date: 07/05/2011    Years since quitting: 8.9  . Smokeless tobacco: Current User    Types: Snuff  Substance Use Topics  . Alcohol use: No    Comment: no alcohol since about 2000  . Drug use: No     Allergies   Lactose intolerance (gi)   Review of Systems Review of  Systems  Reason unable to perform ROS: See HPI as above.     Physical Exam Triage Vital Signs ED Triage Vitals  Enc Vitals Group     BP 06/23/20 1155 (!) 142/85     Pulse Rate 06/23/20 1155 75     Resp 06/23/20 1155 18     Temp 06/23/20 1155 98.2 F (36.8 C)     Temp Source 06/23/20 1155 Oral     SpO2 06/23/20 1155 97 %     Weight --      Height --      Head Circumference --      Peak Flow --      Pain Score 06/23/20 1153 9     Pain Loc --      Pain Edu? --      Excl. in [place]? --    No data found.  Updated Vital Signs BP (!) 142/85 (BP Location: Left Arm)   Pulse 75   Temp 98.2 F (36.8 C) (Oral)   Resp 18   SpO2 97%   Physical Exam Constitutional:      General: He is not in acute distress.    Appearance: Normal appearance. He is well-developed. He is not toxic-appearing or diaphoretic.  HENT:     Head: Normocephalic and atraumatic.  Eyes:     Conjunctiva/sclera: Conjunctivae normal.     Pupils: Pupils are equal, round, and reactive to light.  Pulmonary:     Effort: Pulmonary effort is normal. No respiratory distress.  Musculoskeletal:     Cervical back: Normal range of motion and neck supple.     Comments: Right index finger swelling to PIP with small abrasion. No active bleeding. No erythema, warmth. Tenderness along PIP. Decreased flexion. NVI  Skin:    General: Skin is warm and dry.  Neurological:     Mental Status: He is alert and oriented to person, place, and time.      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 Tiny linear calcification along the dorsal-radial side of the head of the middle phalanx  on the lateral projection attempt, nonspecific for chronically fragmented osteophyte versus (less likely) small collateral ligament avulsion. No well-defined cortical discontinuity to favor fracture. IMPRESSION: 1. 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 2. No cortical discontinuity to indicate a fracture. No  foreign body noted. 3. Tiny calcification along the dorsal-radial side of the head of the middle phalanx-likely chronic, less likely to be associated with avulsion. Electronically Signed   By: Van Clines M.D.   On: 06/23/2020 12:08    Procedures Procedures (including critical care time)  Medications Ordered in UC Medications - No data to display  Initial Impression / Assessment and Plan / UC Course  I have reviewed the triage vital signs and the nursing notes.  Pertinent labs & imaging results that were available during my care of the patient were reviewed by me and considered in my medical decision making (see chart for details).    Discussed x-ray results with patient.  Tiny calcification seen to the MIP with no tenderness, low suspicion for avulsion fracture.  At this time, will splint for comfort with ice compress, NSAIDs, rest.  Expected course of healing discussed.  Return precautions given.  Final Clinical Impressions(s) / UC Diagnoses   Final diagnoses:  Finger pain, right    ED Prescriptions    None     PDMP not reviewed this encounter.   Ok Edwards, PA-C 06/23/20 1221

## 2020-06-23 NOTE — ED Triage Notes (Signed)
Pt c/o finger injury/pain to right index finger 2/2 farm equipment (box blade tractor implement of approximately 300 lbs) falling onto finger yesterday. Pt c/o pain to proximal joint to middle joint of finger, small laceration to lateral aspect noted.  Brisk cap refill noted, finger warm.

## 2020-07-05 ENCOUNTER — Other Ambulatory Visit: Payer: Self-pay

## 2020-07-05 ENCOUNTER — Ambulatory Visit (INDEPENDENT_AMBULATORY_CARE_PROVIDER_SITE_OTHER): Payer: PPO

## 2020-07-05 ENCOUNTER — Ambulatory Visit
Admission: EM | Admit: 2020-07-05 | Discharge: 2020-07-05 | Disposition: A | Discharge status: Home / Self Care | Payer: PPO | Attending: Family Medicine | Admitting: Family Medicine

## 2020-07-05 DIAGNOSIS — M79645 Pain in left finger(s): Secondary | ICD-10-CM

## 2020-07-05 DIAGNOSIS — W231XXA Caught, crushed, jammed, or pinched between stationary objects, initial encounter: Secondary | ICD-10-CM

## 2020-07-05 DIAGNOSIS — S6992XA Unspecified injury of left wrist, hand and finger(s), initial encounter: Secondary | ICD-10-CM | POA: Diagnosis not present

## 2020-07-05 DIAGNOSIS — T1490XA Injury, unspecified, initial encounter: Secondary | ICD-10-CM

## 2020-07-05 NOTE — Discharge Instructions (Addendum)
RICE: rest, ice, compression, elevation as needed for pain.    Heat therapy (hot compress, warm wash rag, hot showers, etc.) can help relax muscles and soothe muscle aches. Cold therapy (ice packs) can be used to help swelling both after injury and after prolonged use of areas of chronic pain/aches.  Pain medication:  500 mg Naprosyn/Aleve (naproxen) every 12 hours with food:  AVOID other NSAIDs while taking this (may have Tylenol).  Important to follow up with specialist(s) below for further evaluation/management if your symptoms persist or worsen.

## 2020-07-05 NOTE — ED Triage Notes (Signed)
Pt states dropped a heavy hammer on his left ring finger x 2 days ago. Pt states throbbing pain and bruising is present. Pt is aox4 and ambulatory.

## 2020-07-05 NOTE — ED Provider Notes (Signed)
[facility]    CSN: [id number] Arrival date & time: 07/05/20  1101      History   Chief Complaint Chief Complaint  Patient presents with  . Finger Injury    dropped hammer on x 2 days ago    HPI Harold Day is a 69 y.o. male   Presenting for left ring finger injury.  States he dropped a heavy hammer at the distal aspect of his finger 2 days ago.  Having pain with bruising, deformity.  Past Medical History:  Diagnosis Date  . Alcoholic ([place])   . Allergic rhinitis   . Depression   . Former smoker    quit 2011  . GERD (gastroesophageal reflux disease)   . Hepatitis C    noticed when he tried to give blood; never had illness    Patient Active Problem List   Diagnosis Date Noted  . Loose stools 02/23/2020  . Nausea and vomiting 02/23/2020  . Acute stress reaction 02/23/2020  . Lactose intolerance 11/28/2019  . History of gout 11/28/2019  . Benign essential tremor 08/20/2017  . Seborrheic keratosis 07/17/2016  . Recovering alcoholic ([place]) 15/72/6203  . BPH (benign prostatic hyperplasia) 03/18/2016  . Depression 03/18/2016  . Hx of colonic polyps 03/08/2012  . GERD (gastroesophageal reflux disease) 03/08/2012  . History of hepatitis C 03/08/2012  . Perennial allergic rhinitis with seasonal variation 03/08/2012    Past Surgical History:  Procedure Laterality Date  . [place] STUDY N/A 09/18/2014   Procedure: [place] STUDY;  Surgeon: Beryle Beams, MD;  Location: [facility];  Service: Endoscopy;  Laterality: N/A;  . COLONOSCOPY    . ESOPHAGEAL MANOMETRY N/A 09/18/2014   Procedure: ESOPHAGEAL MANOMETRY (EM);  Surgeon: Beryle Beams, MD;  Location: [facility];  Service: Endoscopy;  Laterality: N/A;  . SHOULDER ARTHROSCOPY Left 04/2017  . VASECTOMY         Home Medications    Prior to Admission medications   Medication Sig Start Date End Date Taking? Authorizing Provider  aspirin 81 MG tablet Take 81 mg by mouth daily.    [provider]  colchicine 0.6 MG tablet Take 2 tablets (1.2 mg) by mouth x 1 dose, then 1 tablet (0.6 mg) one hour later x 1 dose. 04/23/18   Henson, Vickie L, NP-C  esomeprazole (NEXIUM) 20 MG capsule Take 20 mg by mouth daily at 12 noon.    [provider]  fish oil-omega-3 fatty acids 1000 MG capsule Take 2 g by mouth daily.    [provider]  ibuprofen (ADVIL,MOTRIN) 200 MG tablet Take 600 mg by mouth every 4 (four) hours.    [provider]  loperamide (IMODIUM A-D) 2 MG tablet Take 1 tablet (2 mg total) by mouth at bedtime as needed for diarrhea or loose stools. 02/23/20   Tysinger, Camelia Eng, PA-C  Multiple Vitamins-Minerals (MULTIVITAMIN WITH MINERALS) tablet Take 1 tablet by mouth daily.    [provider]  promethazine (PHENERGAN) 25 MG tablet Take 1 tablet (25 mg total) by mouth every 6 (six) hours as needed for nausea or vomiting. Patient not taking: Reported on 06/05/2020 02/23/20   Tysinger, Camelia Eng, PA-C  propranolol (INDERAL) 20 MG tablet Take 3 tablets (60 mg total) by mouth daily as needed. 05/30/20   Tat, Eustace Quail, DO  triamcinolone (NASACORT AQ) 55 MCG/ACT nasal inhaler Place 2 sprays into the nose daily. 05/11/13   Denita Lung, MD  venlafaxine [facility])  75 MG tablet Take 1 tablet (75 mg total) by mouth 2 (two) times daily. 11/28/19   Denita Lung, MD    Family History Family History  Problem Relation Age of Onset  . Lung cancer Mother   . Autism Son     Social History Social History   Tobacco Use  . Smoking status: Former Smoker    Quit date: 07/05/2011    Years since quitting: 9.0  . Smokeless tobacco: Current User    Types: Snuff  Substance Use Topics  . Alcohol use: No    Comment: no alcohol since about 2000  . Drug use: No     Allergies   Lactose intolerance (gi)   Review of Systems As per HPI   Physical Exam Triage Vital Signs ED Triage Vitals  Enc Vitals Group     BP 07/05/20 1129 (!) 153/75     Pulse Rate  07/05/20 1129 61     Resp 07/05/20 1129 17     Temp 07/05/20 1129 98.6 F (37 C)     Temp Source 07/05/20 1129 Oral     SpO2 07/05/20 1129 97 %     Weight --      Height --      Head Circumference --      Peak Flow --      Pain Score 07/05/20 1130 7     Pain Loc --      Pain Edu? --      Excl. in [place]? --    No data found.  Updated Vital Signs BP (!) 153/75 (BP Location: Left Arm)   Pulse 61   Temp 98.6 F (37 C) (Oral)   Resp 17   SpO2 97%   Visual Acuity Right Eye Distance:   Left Eye Distance:   Bilateral Distance:    Right Eye Near:   Left Eye Near:    Bilateral Near:     Physical Exam Constitutional:      General: He is not in acute distress. HENT:     Head: Normocephalic and atraumatic.  Eyes:     General: No scleral icterus.    Pupils: Pupils are equal, round, and reactive to light.  Cardiovascular:     Rate and Rhythm: Normal rate.  Pulmonary:     Effort: Pulmonary effort is normal. No respiratory distress.     Breath sounds: No wheezing.  Musculoskeletal:        General: Swelling and tenderness present. Normal range of motion.     Comments: L ring finger  Skin:    Coloration: Skin is not jaundiced or pale.  Neurological:     Mental Status: He is alert and oriented to person, place, and time.      UC Treatments / Results  Labs (all labs ordered are listed, but only abnormal results are displayed) Labs Reviewed - No data to display  EKG   Radiology DG Finger Ring Left  Result Date: 07/05/2020 CLINICAL DATA:  Dropped hammer on finger EXAM: LEFT FOURTH FINGER 2+V COMPARISON:  None. FINDINGS: Frontal, oblique, and lateral views were obtained. No fracture or dislocation. Joint spaces appear normal. No erosive change. IMPRESSION: No fracture or dislocation.  No evident arthropathy. Electronically Signed   By: Lowella Grip III M.D.   On: 07/05/2020 12:12    Procedures Procedures (including critical care time)  Medications Ordered in UC  Medications - No data to display  Initial Impression / Assessment and Plan / UC Course  I have reviewed the triage vital signs and the nursing notes.  Pertinent labs & imaging results that were available during my care of the patient were reviewed by me and considered in my medical decision making (see chart for details).     X-ray negative, will treat supportively as outlined below.  Return precautions discussed, pt verbalized understanding and is agreeable to plan. Final Clinical Impressions(s) / UC Diagnoses   Final diagnoses:  Finger pain, left     Discharge Instructions     RICE: rest, ice, compression, elevation as needed for pain.    Heat therapy (hot compress, warm wash rag, hot showers, etc.) can help relax muscles and soothe muscle aches. Cold therapy (ice packs) can be used to help swelling both after injury and after prolonged use of areas of chronic pain/aches.  Pain medication:  500 mg Naprosyn/Aleve (naproxen) every 12 hours with food:  AVOID other NSAIDs while taking this (may have Tylenol).  Important to follow up with specialist(s) below for further evaluation/management if your symptoms persist or worsen.    ED Prescriptions    None     PDMP not reviewed this encounter.   Hall[place]Potvin, [place], [place] 07/05/20 1251

## 2020-08-06 ENCOUNTER — Encounter: Payer: Self-pay | Admitting: Family Medicine

## 2020-08-06 ENCOUNTER — Ambulatory Visit (INDEPENDENT_AMBULATORY_CARE_PROVIDER_SITE_OTHER): Payer: PPO | Admitting: Family Medicine

## 2020-08-06 ENCOUNTER — Other Ambulatory Visit: Payer: Self-pay

## 2020-08-06 VITALS — BP 128/82 | HR 84 | Temp 97.3°F | Wt 166.0 lb

## 2020-08-06 DIAGNOSIS — R413 Other amnesia: Secondary | ICD-10-CM

## 2020-08-06 NOTE — Progress Notes (Signed)
   Subjective:    Patient ID: Harold Day, male    DOB: 08-25-1951, 69 y.o.   MRN: [id number]  HPI He states that within the last several months he has noted difficulty with his memory.  He can remember what he does on a day-to-day basis but states that for instance he read a book approximately months ago and now cannot remember ever reading the book.  He also has had difficulty with remote memory but short-term memory is intact and that he knows what he had for breakfast.  No headache, blurred vision, double vision, weakness, numbness or tingling.  He states that psychologically he is in a very good spot.  Life in general is going well for him   Review of Systems     Objective:   Physical Exam Alert and in no distress.  EOMI.  Other cranial nerves grossly intact.  DTRs normal. MMSE of 28      Assessment & Plan:  Memory loss I discussed memory loss with him in detail.  Explained that usually its short-term memory that is more of an issue.  At this point we will take a watchful waiting approach.  He was comfortable with that but if this occurs again, I will reevaluate and possibly send to neurology.

## 2020-08-24 NOTE — Progress Notes (Signed)
Assessment/Plan:    1.  Essential Tremor  -Patient can continue propranolol, 20 mg as needed.  He generally uses it about 4 or 5 days/week.  2.  Memory change  -no evidence of neurodegen change  -did offer neurocog testing  -will check TSH (last  Checked 2017) and b12  3.  Since patient really is doing well on an as needed low-dose of propranolol, I will discharge him back to his primary care physician.  Happy to see the patient back in the future if needed.  Subjective:   Harold Day was seen today in follow up for essential tremor.  My previous records were reviewed prior to todays visit. Pt states that tremor is a little worse. Medical records are reviewed.  Saw primary care on October 18 and had some concerns about memory change.  MMSE was 28/30.  Last labs in February, 2021.  Pt states that he is in a book club.  He could not recall the previous book he read and that was really usual for him - not the plot, author, etc. Otherwise doing well.   Current prescribed movement disorder medications: Propranolol, 20 mg daily as needed    ALLERGIES:   Allergies  Allergen Reactions  . Lactose Intolerance (Gi)     CURRENT MEDICATIONS:  Outpatient Encounter Medications as of 08/28/2020  Medication Sig  . aspirin 81 MG tablet Take 81 mg by mouth daily.  . colchicine 0.6 MG tablet Take 2 tablets (1.2 mg) by mouth x 1 dose, then 1 tablet (0.6 mg) one hour later x 1 dose.  . esomeprazole (NEXIUM) 20 MG capsule Take 20 mg by mouth daily at 12 noon.  . fish oil-omega-3 fatty acids 1000 MG capsule Take 2 g by mouth daily.  Marland Kitchen ibuprofen (ADVIL,MOTRIN) 200 MG tablet Take 600 mg by mouth every 4 (four) hours.  Marland Kitchen loperamide (IMODIUM A-D) 2 MG tablet Take 1 tablet (2 mg total) by mouth at bedtime as needed for diarrhea or loose stools.  . Multiple Vitamins-Minerals (MULTIVITAMIN WITH MINERALS) tablet Take 1 tablet by mouth daily.  . promethazine (PHENERGAN) 25 MG tablet Take 1 tablet (25  mg total) by mouth every 6 (six) hours as needed for nausea or vomiting.  . propranolol (INDERAL) 20 MG tablet Take 3 tablets (60 mg total) by mouth daily as needed.  . triamcinolone (NASACORT AQ) 55 MCG/ACT nasal inhaler Place 2 sprays into the nose daily.  Marland Kitchen venlafaxine (EFFEXOR) 75 MG tablet Take 1 tablet (75 mg total) by mouth 2 (two) times daily.   No facility-administered encounter medications on file as of 08/28/2020.     Objective:    PHYSICAL EXAMINATION:    VITALS:   Vitals:   08/28/20 1325  BP: 114/68  Pulse: 68  SpO2: 97%  Weight: 167 lb (75.8 kg)  Height: 5\' 8"  (1.727 m)    GEN:  The patient appears stated age and is in NAD. HEENT:  Normocephalic, atraumatic.  The mucous membranes are moist. The superficial temporal arteries are without ropiness or tenderness. CV:  RRR Lungs:  CTAB Neck/HEME:  There are no carotid bruits bilaterally.  Neurological examination:  Orientation: The patient is alert and oriented x3. Cranial nerves: There is good facial symmetry. The speech is fluent and clear. Soft palate rises symmetrically and there is no tongue deviation. Hearing is intact to conversational tone. Sensation: Sensation is intact to light touch throughout Motor: Strength is at least antigravity x4.  Movement examination: Tone: There  is normal tone in the UE/LE Abnormal movements: no rest tremor.  Has jerky, somewhat irregular tremor of outstretched hands bilaterally Coordination:  There is no decremation with RAM's Gait and Station: The patient has no difficulty arising out of a deep-seated chair without the use of the hands. The patient's stride length is good I have reviewed and interpreted the following labs independently   Chemistry      Component Value Date/Time   NA 140 11/28/2019 0950   K 4.9 11/28/2019 0950   CL 102 11/28/2019 0950   CO2 20 11/28/2019 0950   BUN 17 11/28/2019 0950   CREATININE 1.17 11/28/2019 0950   CREATININE 1.20 09/22/2016 0820       Component Value Date/Time   CALCIUM 10.0 11/28/2019 0950   ALKPHOS 83 11/28/2019 0950   AST 20 11/28/2019 0950   ALT 17 11/28/2019 0950   BILITOT 0.3 11/28/2019 0950      Lab Results  Component Value Date   WBC 9.8 11/28/2019   HGB 15.9 11/28/2019   HCT 46.7 11/28/2019   MCV 92 11/28/2019   PLT 244 11/28/2019   Lab Results  Component Value Date   TSH 1.23 09/22/2016     Chemistry      Component Value Date/Time   NA 140 11/28/2019 0950   K 4.9 11/28/2019 0950   CL 102 11/28/2019 0950   CO2 20 11/28/2019 0950   BUN 17 11/28/2019 0950   CREATININE 1.17 11/28/2019 0950   CREATININE 1.20 09/22/2016 0820      Component Value Date/Time   CALCIUM 10.0 11/28/2019 0950   ALKPHOS 83 11/28/2019 0950   AST 20 11/28/2019 0950   ALT 17 11/28/2019 0950   BILITOT 0.3 11/28/2019 0950      No results found for: VITAMINB12 \   Total time spent on today's visit was 20 minutes, including both face-to-face time and nonface-to-face time.  Time included that spent on review of records (prior notes available to me/labs/imaging if pertinent), discussing treatment and goals, answering patient's questions and coordinating care.  Cc:  Denita Lung, MD

## 2020-08-28 ENCOUNTER — Encounter: Payer: Self-pay | Admitting: Neurology

## 2020-08-28 ENCOUNTER — Other Ambulatory Visit (INDEPENDENT_AMBULATORY_CARE_PROVIDER_SITE_OTHER): Payer: PPO

## 2020-08-28 ENCOUNTER — Ambulatory Visit: Payer: PPO | Admitting: Neurology

## 2020-08-28 ENCOUNTER — Other Ambulatory Visit: Payer: Self-pay

## 2020-08-28 VITALS — BP 114/68 | HR 68 | Ht 68.0 in | Wt 167.0 lb

## 2020-08-28 DIAGNOSIS — R251 Tremor, unspecified: Secondary | ICD-10-CM

## 2020-08-28 DIAGNOSIS — R5383 Other fatigue: Secondary | ICD-10-CM

## 2020-08-28 DIAGNOSIS — R413 Other amnesia: Secondary | ICD-10-CM | POA: Diagnosis not present

## 2020-08-28 LAB — VITAMIN B12: Vitamin B-12: 640 pg/mL (ref 211–911)

## 2020-08-28 LAB — TSH: TSH: 1.21 u[IU]/mL (ref 0.35–4.50)

## 2020-08-28 NOTE — Patient Instructions (Signed)
Your provider has requested that you have labwork completed today. Please go to [facility] ([id number]) on the second floor of this building before leaving the office today. You do not need to check in. If you are not called within 15 minutes please check with the front desk.   

## 2020-09-03 ENCOUNTER — Other Ambulatory Visit: Payer: Self-pay | Admitting: Neurology

## 2020-09-19 DIAGNOSIS — H2513 Age-related nuclear cataract, bilateral: Secondary | ICD-10-CM | POA: Diagnosis not present

## 2020-09-19 DIAGNOSIS — H40033 Anatomical narrow angle, bilateral: Secondary | ICD-10-CM | POA: Diagnosis not present

## 2020-09-25 ENCOUNTER — Ambulatory Visit: Payer: PPO | Admitting: Neurology

## 2020-10-29 DIAGNOSIS — Z1152 Encounter for screening for COVID-19: Secondary | ICD-10-CM | POA: Diagnosis not present

## 2020-11-22 ENCOUNTER — Other Ambulatory Visit: Payer: Self-pay | Admitting: Internal Medicine

## 2020-11-22 ENCOUNTER — Other Ambulatory Visit: Payer: Self-pay

## 2020-11-22 ENCOUNTER — Telehealth (INDEPENDENT_AMBULATORY_CARE_PROVIDER_SITE_OTHER): Payer: PPO | Admitting: Family Medicine

## 2020-11-22 ENCOUNTER — Encounter: Payer: Self-pay | Admitting: Family Medicine

## 2020-11-22 ENCOUNTER — Other Ambulatory Visit (INDEPENDENT_AMBULATORY_CARE_PROVIDER_SITE_OTHER): Payer: PPO

## 2020-11-22 VITALS — Wt 165.0 lb

## 2020-11-22 DIAGNOSIS — B349 Viral infection, unspecified: Secondary | ICD-10-CM

## 2020-11-22 DIAGNOSIS — J029 Acute pharyngitis, unspecified: Secondary | ICD-10-CM | POA: Diagnosis not present

## 2020-11-22 DIAGNOSIS — R6889 Other general symptoms and signs: Secondary | ICD-10-CM

## 2020-11-22 LAB — POC COVID19 BINAXNOW: SARS Coronavirus 2 Ag: NEGATIVE

## 2020-11-22 LAB — POCT INFLUENZA A/B
Influenza A, POC: NEGATIVE
Influenza B, POC: NEGATIVE

## 2020-11-22 NOTE — Progress Notes (Signed)
Start time: 2:53 End time: 3:08  Virtual Visit via Video Note  I connected with Harold Day on 11/22/20 by a video enabled telemedicine application and verified that I am speaking with the correct person using two identifiers.  Location: Patient: home Provider: office   I discussed the limitations of evaluation and management by telemedicine and the availability of in person appointments. The patient expressed understanding and agreed to proceed.  History of Present Illness:  Chief Complaint  Patient presents with  . sick    Sore throat, chills, diarrhea, weakness, headache and muscle pain. Sneezing a lot the night before last and then all the other symptoms started this morning   2 nights ago he sneezed twice, and this was followed by runny nose, congestion.  Felt like he was coming down with a cold.  Yesterday he woke up feeling fine. Today he woke up with sore throat, chills, feels flushed now.  Denies runny nose, sneezing, congestion or sinus pain.  Scratchy throat now.  Denies cough. +fatigue. Diarrhea started yesterday.  No nausea or vomiting. Denies myalgias.  No sick exposures. Vaccinated x 3  No OTC meds tried yet  PMH, PSH, SH reviewed  Outpatient Encounter Medications as of 11/22/2020  Medication Sig  . aspirin 81 MG tablet Take 81 mg by mouth daily.  Marland Kitchen esomeprazole (NEXIUM) 20 MG capsule Take 20 mg by mouth daily at 12 noon.  . fish oil-omega-3 fatty acids 1000 MG capsule Take 2 g by mouth daily.  . Multiple Vitamins-Minerals (MULTIVITAMIN WITH MINERALS) tablet Take 1 tablet by mouth daily.  Marland Kitchen triamcinolone (NASACORT AQ) 55 MCG/ACT nasal inhaler Place 2 sprays into the nose daily.  Marland Kitchen venlafaxine (EFFEXOR) 75 MG tablet Take 1 tablet (75 mg total) by mouth 2 (two) times daily.  . colchicine 0.6 MG tablet Take 2 tablets (1.2 mg) by mouth x 1 dose, then 1 tablet (0.6 mg) one hour later x 1 dose. (Patient not taking: Reported on 11/22/2020)  . ibuprofen (ADVIL,MOTRIN)  200 MG tablet Take 600 mg by mouth every 4 (four) hours. (Patient not taking: Reported on 11/22/2020)  . loperamide (IMODIUM A-D) 2 MG tablet Take 1 tablet (2 mg total) by mouth at bedtime as needed for diarrhea or loose stools. (Patient not taking: Reported on 11/22/2020)  . promethazine (PHENERGAN) 25 MG tablet Take 1 tablet (25 mg total) by mouth every 6 (six) hours as needed for nausea or vomiting. (Patient not taking: Reported on 11/22/2020)  . propranolol (INDERAL) 20 MG tablet TAKE 3 TABLETS(60 MG) BY MOUTH DAILY AS NEEDED (Patient not taking: Reported on 11/22/2020)   No facility-administered encounter medications on file as of 11/22/2020.   Allergies  Allergen Reactions  . Lactose Intolerance (Gi)    ROS: URI symptoms, diarrhea per HPI. See HPI. No loss of taste or smell. No chest pain, shortness of breath, leg swelling/pain. No rash. See HPI   Observations/Objective:  Wt 165 lb (74.8 kg)   BMI 25.09 kg/m   Pleasant, well-appearing male, in no distress. He is alert and oriented, speaking easily. Rare cough. Exam is limited due to virtual nature of the visit   Assessment and Plan:  Viral syndrome - fever/chills (subjective), URI symptoms, diarrhea. r/o COVID.  Supportive measures. f/u if worsening  Supportive measures reviewed-- Tylenol Antihistamines vs decongestants (Sudafed) prn Guaifenesin/mucinex Dextromethorphan prn cough (if develops)   Follow Up Instructions:    I discussed the assessment and treatment plan with the patient. The patient was provided an  opportunity to ask questions and all were answered. The patient agreed with the plan and demonstrated an understanding of the instructions.   The patient was advised to call back or seek an in-person evaluation if the symptoms worsen or if the condition fails to improve as anticipated.  I spent 20 minutes dedicated to the care of this patient, including pre-visit review of records, face to face time, post-visit  ordering of testing and documentation.    Vikki Ports, MD

## 2020-11-22 NOTE — Patient Instructions (Signed)
Stay well hydrated--drink plenty of water, with goal of urine being very pale/clear.  Use tylenol if needed for fever, pain, headache. We discussed using antihistamines (such as claritin) as needed for runny nose and sneezing, versus using decongestant such as sudafed (for sinus pain, congestion, runny nose). We also discussed using guaifenesin (the expectorant found in Mucinex), to loosen the drainage (the drainage in the throat, sinuses, and the phlegm in the chest, if that develops). Dextromethorphan (found in many of the mucinex and robitussin formulations--the "DM" versus a separate Delsym syrup) can be helpful if you develop a bothersome cough.  Follow a bland diet for the diarrhea, avoid dairy.  Contact us if symptoms persist or worsen.  Your rapid COVID test was negative; this is often negative if checked too early. It is best to isolate until your PCR comes back (1-2 days).  I hope you feel better soon!

## 2020-11-23 LAB — NOVEL CORONAVIRUS, NAA: SARS-CoV-2, NAA: NOT DETECTED

## 2020-11-23 LAB — SARS-COV-2, NAA 2 DAY TAT

## 2020-11-28 ENCOUNTER — Other Ambulatory Visit: Payer: Self-pay

## 2020-11-28 ENCOUNTER — Ambulatory Visit (INDEPENDENT_AMBULATORY_CARE_PROVIDER_SITE_OTHER): Payer: PPO | Admitting: Family Medicine

## 2020-11-28 ENCOUNTER — Encounter: Payer: Self-pay | Admitting: Family Medicine

## 2020-11-28 VITALS — BP 136/82 | HR 74 | Temp 96.4°F | Ht 68.0 in | Wt 167.8 lb

## 2020-11-28 DIAGNOSIS — E785 Hyperlipidemia, unspecified: Secondary | ICD-10-CM | POA: Diagnosis not present

## 2020-11-28 DIAGNOSIS — B356 Tinea cruris: Secondary | ICD-10-CM

## 2020-11-28 DIAGNOSIS — Z8739 Personal history of other diseases of the musculoskeletal system and connective tissue: Secondary | ICD-10-CM

## 2020-11-28 DIAGNOSIS — N401 Enlarged prostate with lower urinary tract symptoms: Secondary | ICD-10-CM | POA: Diagnosis not present

## 2020-11-28 DIAGNOSIS — G250 Essential tremor: Secondary | ICD-10-CM

## 2020-11-28 DIAGNOSIS — F1021 Alcohol dependence, in remission: Secondary | ICD-10-CM | POA: Diagnosis not present

## 2020-11-28 DIAGNOSIS — M7062 Trochanteric bursitis, left hip: Secondary | ICD-10-CM | POA: Diagnosis not present

## 2020-11-28 DIAGNOSIS — F341 Dysthymic disorder: Secondary | ICD-10-CM

## 2020-11-28 DIAGNOSIS — J3089 Other allergic rhinitis: Secondary | ICD-10-CM

## 2020-11-28 DIAGNOSIS — Z860100 Personal history of colon polyps, unspecified: Secondary | ICD-10-CM

## 2020-11-28 DIAGNOSIS — Z0000 Encounter for general adult medical examination without abnormal findings: Secondary | ICD-10-CM

## 2020-11-28 DIAGNOSIS — H259 Unspecified age-related cataract: Secondary | ICD-10-CM

## 2020-11-28 DIAGNOSIS — J302 Other seasonal allergic rhinitis: Secondary | ICD-10-CM

## 2020-11-28 DIAGNOSIS — K219 Gastro-esophageal reflux disease without esophagitis: Secondary | ICD-10-CM | POA: Diagnosis not present

## 2020-11-28 LAB — CBC WITH DIFFERENTIAL/PLATELET
Basophils Absolute: 0.1 10*3/uL (ref 0.0–0.2)
Basos: 1 %
EOS (ABSOLUTE): 0.1 10*3/uL (ref 0.0–0.4)
Eos: 1 %
Hematocrit: 44.2 % (ref 37.5–51.0)
Hemoglobin: 15.4 g/dL (ref 13.0–17.7)
Immature Grans (Abs): 0 10*3/uL (ref 0.0–0.1)
Immature Granulocytes: 1 %
Lymphocytes Absolute: 2.3 10*3/uL (ref 0.7–3.1)
Lymphs: 26 %
MCH: 31.6 pg (ref 26.6–33.0)
MCHC: 34.8 g/dL (ref 31.5–35.7)
MCV: 91 fL (ref 79–97)
Monocytes Absolute: 0.7 10*3/uL (ref 0.1–0.9)
Monocytes: 8 %
Neutrophils Absolute: 5.6 10*3/uL (ref 1.4–7.0)
Neutrophils: 63 %
Platelets: 256 10*3/uL (ref 150–450)
RBC: 4.88 x10E6/uL (ref 4.14–5.80)
RDW: 12.2 % (ref 11.6–15.4)
WBC: 8.8 10*3/uL (ref 3.4–10.8)

## 2020-11-28 LAB — LIPID PANEL
Chol/HDL Ratio: 6.6 ratio — ABNORMAL HIGH (ref 0.0–5.0)
Cholesterol, Total: 244 mg/dL — ABNORMAL HIGH (ref 100–199)
HDL: 37 mg/dL — ABNORMAL LOW (ref >39)
LDL Chol Calc (NIH): 136 mg/dL — ABNORMAL HIGH (ref 0–99)
Triglycerides: 388 mg/dL — ABNORMAL HIGH (ref 0–149)
VLDL Cholesterol Cal: 71 mg/dL — ABNORMAL HIGH (ref 5–40)

## 2020-11-28 LAB — COMPREHENSIVE METABOLIC PANEL
ALT: 21 IU/L (ref 0–44)
AST: 24 IU/L (ref 0–40)
Albumin/Globulin Ratio: 2 (ref 1.2–2.2)
Albumin: 4.7 g/dL (ref 3.8–4.8)
Alkaline Phosphatase: 86 IU/L (ref 44–121)
BUN/Creatinine Ratio: 10 (ref 10–24)
BUN: 12 mg/dL (ref 8–27)
Bilirubin Total: 0.3 mg/dL (ref 0.0–1.2)
CO2: 20 mmol/L (ref 20–29)
Calcium: 10.1 mg/dL (ref 8.6–10.2)
Chloride: 99 mmol/L (ref 96–106)
Creatinine, Ser: 1.26 mg/dL (ref 0.76–1.27)
GFR calc Af Amer: 67 mL/min/{1.73_m2} (ref >59)
GFR calc non Af Amer: 58 mL/min/{1.73_m2} — ABNORMAL LOW (ref >59)
Globulin, Total: 2.4 g/dL (ref 1.5–4.5)
Glucose: 89 mg/dL (ref 65–99)
Potassium: 4.4 mmol/L (ref 3.5–5.2)
Sodium: 138 mmol/L (ref 134–144)
Total Protein: 7.1 g/dL (ref 6.0–8.5)

## 2020-11-28 MED ORDER — VENLAFAXINE HCL 75 MG PO TABS: 75.0000 mg | ORAL_TABLET | Freq: Two times a day (BID) | ORAL | 3 refills | Status: DC

## 2020-11-28 MED ORDER — LIDOCAINE HCL 1 % IJ SOLN: 10.0000 mL | Freq: Once | INTRAMUSCULAR | Status: DC

## 2020-11-28 MED ORDER — BUPIVACAINE HCL 0.5 % IJ SOLN
3.0000 mL | Freq: Once | INTRAMUSCULAR | Status: AC
  Administered 2020-11-28: 3 mL

## 2020-11-28 MED ORDER — TRIAMCINOLONE ACETONIDE 40 MG/ML IJ SUSP
40.0000 mg | Freq: Once | INTRAMUSCULAR | Status: AC
  Administered 2020-11-28: 40 mg via INTRAMUSCULAR

## 2020-11-28 NOTE — Addendum Note (Signed)
Addended by: Elyse Jarvis on: 11/28/2020 02:08 PM   Modules accepted: Orders

## 2020-11-28 NOTE — Progress Notes (Signed)
Harold Day is a 70 y.o. male who presents for annual wellness visit ,CPEand follow-up on chronic medical conditions.  He has started to have difficulty with left hip pain.  He states it is similar to a previous episode that required an injection into his greater trochanteric bursa.  He also notes that he has had difficulty with tinea cruris and has had to use topical medications for control of this and then it would come back.  He does have a history of cataracts and is followed regularly by ophthalmology.  He also is taking Inderal for his tremor.  Continues on Effexor to help with his underlying dysthymia/depression.  He apparently is under  stress dealing with his 62 year old son who has bipolar disorder and apparently still not adequately controlled.  The son does live at the house with him.  His reflux is under good control.  He is having no difficulty with gout.  He does have a history of colonic polyps and had a colonoscopy in 2019.  He does have a history of a tremor that is handled with Inderal.  He continues on Nexium for control of his reflux.  His allergies are not causing much difficulty at the present time.  He remains alcohol free.  He does have a history of BPH but presently is not complaining of any problems.   Immunizations and Health Maintenance Immunization History  Administered Date(s) Administered  . DT (Pediatric) 02/01/1996  . Hepatitis A, Adult 07/05/2015, 08/06/2015  . Influenza Split 09/08/2002, 07/23/2006  . Influenza, High Dose Seasonal PF 07/17/2016, 08/20/2017  . Influenza, Quadrivalent, Recombinant, Inj, Pf 07/07/2019  . Influenza,inj,Quad PF,6+ Mos 07/05/2015, 08/21/2018  . PFIZER(Purple Top)SARS-COV-2 Vaccination 12/03/2019, 12/25/2019  . Pneumococcal Conjugate-13 07/17/2016  . Pneumococcal Polysaccharide-23 08/20/2017  . Td 07/23/2006  . Tdap 07/17/2016  . Zoster 03/08/2012  . Zoster Recombinat (Shingrix) 08/25/2017, 02/06/2018   Health Maintenance Due   Topic Date Due  . URINE MICROALBUMIN  Never done  . COVID-19 Vaccine (3 - Pfizer risk 4-dose series) 01/22/2020  . INFLUENZA VACCINE  05/20/2020    Last colonoscopy: 06/17/18 Last PSA: 07/17/16 Dentist: Q six months Ophtho: Q year Exercise: weights five days a week   Other doctors caring for patient include: Dr. [facility]  Advanced Directives: Does Patient Have a Medical Advance Directive?: Yes Type of Advance Directive: Living will,Healthcare Power of [place] in Chart?: No - copy requested  Depression screen:  See questionnaire below.     Depression screen [facility] 2/9 11/28/2020 08/06/2020 06/05/2020 11/28/2019 10/06/2018  Decreased Interest 0 0 0 0 0  Down, Depressed, Hopeless 0 0 0 0 0  PHQ - 2 Score 0 0 0 0 0  Altered sleeping - - - - -  Tired, decreased energy - - - - -  Change in appetite - - - - -  Feeling bad or failure about yourself  - - - - -  Trouble concentrating - - - - -  Moving slowly or fidgety/restless - - - - -  Suicidal thoughts - - - - -  PHQ-9 Score - - - - -  Difficult doing work/chores - - - - -    Fall Screen: See Questionaire below.   Fall Risk  11/28/2020 08/28/2020 11/28/2019 09/14/2019 10/06/2018  Falls in the past year? 0 1 0 0 1  Comment - - - [place] Telephone Survey: data to providers prior to load -  Number falls in past yr: -  0 - - 1  Injury with Fall? - 0 - - 0  Risk for fall due to : - - - - -  Follow up - - - - -    ADL screen:  See questionnaire below.  Functional Status Survey: Is the patient deaf or have difficulty hearing?: Yes Does the patient have difficulty seeing, even when wearing glasses/contacts?: No Does the patient have difficulty concentrating, remembering, or making decisions?: No Does the patient have difficulty walking or climbing stairs?: No Does the patient have difficulty dressing or bathing?: No Does the patient have difficulty doing errands alone such as visiting a doctor's office or  shopping?: No   Review of Systems  Constitutional: -, -unexpected weight change, -anorexia, -fatigue Allergy: -sneezing, -itching, -congestion Dermatology: denies changing moles, rash, lumps ENT: -runny nose, -ear pain, -sore throat,  Cardiology:  -chest pain, -palpitations, -orthopnea, Respiratory: -cough, -shortness of breath, -dyspnea on exertion, -wheezing,  Gastroenterology: -abdominal pain, -nausea, -vomiting, -diarrhea, -constipation, -dysphagia Hematology: -bleeding or bruising problems Musculoskeletal: -arthralgias, -myalgias, -joint swelling, -back pain, - Ophthalmology: -vision changes,  Urology: -dysuria, -difficulty urinating,  -urinary frequency, -urgency, incontinence Neurology: -, -numbness, , -memory loss, -falls, -dizziness    PHYSICAL EXAM:   General Appearance: Alert, cooperative, no distress, appears stated age Head: Normocephalic, without obvious abnormality, atraumatic Eyes: PERRL, conjunctiva/corneas clear, EOM's intact,  Ears: Normal TM's and external ear canals Nose: Nares normal, mucosa normal, no drainage or sinus   tenderness Throat: Lips, mucosa, and tongue normal; teeth and gums normal Neck: Supple, no lymphadenopathy, thyroid:no enlargement/tenderness/nodules; no carotid bruit or JVD Lungs: Clear to auscultation bilaterally without wheezes, rales or ronchi; respirations unlabored Heart: Regular rate and rhythm, S1 and S2 normal, no murmur, rub or gallop Abdomen: Soft, non-tender, nondistended, normoactive bowel sounds, no masses, no hepatosplenomegaly Extremities: No clubbing, cyanosis or edema Pulses: 2+ and symmetric all extremities Skin: Skin color, texture, turgor normal, no rashes or lesions Lymph nodes: Cervical, supraclavicular, and axillary nodes normal Neurologic: CNII-XII intact, normal strength, sensation and gait; reflexes 2+ and symmetric throughout   Psych: Normal mood, affect, hygiene and grooming Tender to palpation over the left  greater trochanter.  Full hip motion is noted.  Negative straight leg raising. ASSESSMENT/PLAN: Routine general medical examination at a health care facility - Plan: CBC with Differential/Platelet, Comprehensive metabolic panel, Lipid panel  Trochanteric bursitis of left hip: The area of maximum pain was identified.  40 mg of Kenalog and 3 cc of Xylocaine was injected into that area.  History of gout: Presently he is doing fine so no intervention needed.  Recovering alcoholic ([place])  Benign prostatic hyperplasia with lower urinary tract symptoms, symptom details unspecified:: No intervention at the present time.  Dysthymia: Continue on the Effexor but get counseling to learn how to handle the problem with your son  Hx of colonic polyps - Tubular adenoma. August 2019: Follow-up in 5 years.  Gastroesophageal reflux disease without esophagitis - Plan: CBC with Differential/Platelet, Comprehensive metabolic panel  Perennial allergic rhinitis with seasonal variation: Continue with OTC medications  Benign essential tremor: Continuing Inderal  Hyperlipidemia, unspecified hyperlipidemia type - Plan: Lipid panel  Age-related cataract of both eyes, unspecified age-related cataract type: Follow-up with ophthalmology  Tinea cruris: Use the topical medication for control of the tinea until it is under control and then back off and use it regularly roughly once per week to see if this will keep it under control.   Immunization recommendations discussed.  Colonoscopy recommendations reviewed. Follow-up with [facility] concerning home to help you with your child.  Follow-up with neurology for the tremor as well as ophthalmology for the cataract.   Medicare Attestation I have personally reviewed: The patient's medical and social history Their use of alcohol, tobacco or illicit drugs Their current medications and supplements The patient's functional ability including ADLs,fall risks,  home safety risks, cognitive, and hearing and visual impairment Diet and physical activities Evidence for depression or mood disorders  The patient's weight, height, and BMI have been recorded in the chart.  I have made referrals, counseling, and provided education to the patient based on review of the above and I have provided the patient with a written personalized care plan for preventive services.     Jill Alexanders, MD   11/28/2020

## 2020-11-28 NOTE — Patient Instructions (Signed)
  Harold Day , Thank you for taking time to come for your Medicare Wellness Visit. I appreciate your ongoing commitment to your health goals. Please review the following plan we discussed and let me know if I can assist you in the future.   These are the goals we discussed: Follow-up with [facility] concerning home to help you with your child.  Follow-up with neurology for the tremor as well as ophthalmology for the cataract.  Use the topical medication for control of the tinea until it is under control and then back off and use it regularly roughly once per week to see if this will keep it under control. This is a list of the screening recommended for you and due dates:  Health Maintenance  Topic Date Due  . Urine Protein Check  Never done  . COVID-19 Vaccine (3 - Pfizer risk 4-dose series) 01/22/2020  . Flu Shot  05/20/2020  . Tetanus Vaccine  07/17/2026  . Colon Cancer Screening  06/17/2028  .  Hepatitis C: One time screening is recommended by Center for Disease Control  (CDC) for  adults born from [birth year] through 1965.   Completed  . Pneumonia vaccines  Completed

## 2020-11-30 DIAGNOSIS — D126 Benign neoplasm of colon, unspecified: Secondary | ICD-10-CM | POA: Insufficient documentation

## 2020-11-30 MED ORDER — ATORVASTATIN CALCIUM 20 MG PO TABS: 20.0000 mg | ORAL_TABLET | Freq: Every day | ORAL | 3 refills | Status: DC

## 2020-11-30 NOTE — Addendum Note (Signed)
Addended by: Denita Lung on: 11/30/2020 01:45 PM   Modules accepted: Orders

## 2020-12-03 ENCOUNTER — Telehealth: Payer: Self-pay

## 2020-12-03 ENCOUNTER — Telehealth: Payer: Self-pay | Admitting: Family Medicine

## 2020-12-03 NOTE — Telephone Encounter (Signed)
LVM advising pt to call and let us know if he started the new med. [place]

## 2020-12-03 NOTE — Telephone Encounter (Signed)
Pt wanted to let you know he has started the new medication and it is working fine

## 2020-12-10 ENCOUNTER — Other Ambulatory Visit: Payer: Self-pay | Admitting: Neurology

## 2020-12-11 NOTE — Telephone Encounter (Signed)
Reviewed patients chart.   Contacted the patient to see how often he is taking the medication. He states about twice a day a few times a week. He states he informed his pharmacy that he would contact them when he needed a refill and is unsure why they sent the rx now. He states he does not need a refill at this time.

## 2021-02-04 ENCOUNTER — Telehealth (INDEPENDENT_AMBULATORY_CARE_PROVIDER_SITE_OTHER): Payer: PPO | Admitting: Family Medicine

## 2021-02-04 ENCOUNTER — Encounter: Payer: Self-pay | Admitting: Family Medicine

## 2021-02-04 VITALS — Temp 97.0°F | Ht 68.5 in | Wt 160.0 lb

## 2021-02-04 DIAGNOSIS — J209 Acute bronchitis, unspecified: Secondary | ICD-10-CM | POA: Diagnosis not present

## 2021-02-04 DIAGNOSIS — J0100 Acute maxillary sinusitis, unspecified: Secondary | ICD-10-CM | POA: Diagnosis not present

## 2021-02-04 MED ORDER — AMOXICILLIN 875 MG PO TABS: 875.0000 mg | ORAL_TABLET | Freq: Two times a day (BID) | ORAL | 0 refills | Status: DC

## 2021-02-04 NOTE — Progress Notes (Signed)
   Subjective:    Patient ID: Harold Day, male    DOB: 07/23/1951, 70 y.o.   MRN: [id number]  HPI Documentation for virtual audio and video telecommunications through [facility] encounter: The patient was located at home. 2 patient identifiers used.  The provider was located in the office. The patient did consent to this visit and is aware of possible charges through their insurance for this visit. The other persons participating in this telemedicine service were none. Time spent on call was 5 minutes and in review of previous records >20 minutes total for counseling and coordination of care. This virtual service is not related to other E/M service within previous 7 days. He states that last Friday he noted difficulty with right upper tooth/maxillary pain and then developed some postnasal drainage, nasal congestion mainly on the right as well as coughing and some hoarse voice.  Review of Systems     Objective:   Physical Exam Alert and in no distress.  He was observed coughing and does complain of slight hoarse voice.  He is tender over his right maxillary sinus.       Assessment & Plan:  Acute non-recurrent maxillary sinusitis - Plan: amoxicillin (AMOXIL) 875 MG tablet  Acute bronchitis, unspecified organism - Plan: amoxicillin (AMOXIL) 875 MG tablet I explained that I thought this was a sinus infection and not necessarily a dental related issue.  He will let me know if he does not get back to normal.

## 2021-02-22 ENCOUNTER — Encounter: Payer: Self-pay | Admitting: Family Medicine

## 2021-02-22 ENCOUNTER — Other Ambulatory Visit: Payer: Self-pay

## 2021-02-22 ENCOUNTER — Ambulatory Visit (INDEPENDENT_AMBULATORY_CARE_PROVIDER_SITE_OTHER): Payer: PPO | Admitting: Family Medicine

## 2021-02-22 ENCOUNTER — Ambulatory Visit
Admission: RE | Admit: 2021-02-22 | Discharge: 2021-02-22 | Disposition: A | Discharge status: Home / Self Care | Payer: PPO | Source: Ambulatory Visit | Attending: Family Medicine | Admitting: Family Medicine

## 2021-02-22 VITALS — BP 104/72 | HR 76 | Temp 96.5°F | Ht 68.5 in | Wt 164.8 lb

## 2021-02-22 DIAGNOSIS — M1612 Unilateral primary osteoarthritis, left hip: Secondary | ICD-10-CM | POA: Diagnosis not present

## 2021-02-22 DIAGNOSIS — M25552 Pain in left hip: Secondary | ICD-10-CM

## 2021-02-22 DIAGNOSIS — M545 Low back pain, unspecified: Secondary | ICD-10-CM | POA: Diagnosis not present

## 2021-02-22 NOTE — Progress Notes (Signed)
   Subjective:    Patient ID: Harold Day, male    DOB: 02-Dec-1950, 70 y.o.   MRN: [id number]  HPI He is here for recheck on left hip pain.  He did have an his injection was last visit and states that it did not last but roughly a week.  Now the pain is interfering with his ADLs and to the point that he does not want to walk [place].     Review of Systems     Objective:   Physical Exam No tenderness over the trochanteric bursa.  Full motion of the hip.  Complains of some palpable discomfort lateral to the SI joint.       Assessment & Plan:  Left hip pain - Plan: DG Hip Unilat W OR W/O Pelvis 2-3 Views Left, DG Lumbar Spine Complete Will check the hip out but also in concerned about pelvis as his pain is more or less between the greater trochanter and SI joint area.

## 2021-02-25 ENCOUNTER — Encounter: Payer: Self-pay | Admitting: Family Medicine

## 2021-02-25 DIAGNOSIS — I700 Atherosclerosis of aorta: Secondary | ICD-10-CM | POA: Insufficient documentation

## 2021-02-26 ENCOUNTER — Other Ambulatory Visit: Payer: Self-pay

## 2021-02-26 DIAGNOSIS — M25552 Pain in left hip: Secondary | ICD-10-CM

## 2021-02-26 NOTE — Telephone Encounter (Signed)
Order put in. KH °

## 2021-03-05 ENCOUNTER — Ambulatory Visit: Payer: PPO | Admitting: Orthopaedic Surgery

## 2021-03-05 ENCOUNTER — Other Ambulatory Visit: Payer: Self-pay

## 2021-03-05 ENCOUNTER — Encounter: Payer: Self-pay | Admitting: Orthopaedic Surgery

## 2021-03-05 VITALS — Ht 68.5 in | Wt 164.0 lb

## 2021-03-05 DIAGNOSIS — M25552 Pain in left hip: Secondary | ICD-10-CM | POA: Diagnosis not present

## 2021-03-05 NOTE — Progress Notes (Signed)
Office Visit Note   Patient: Harold Day           Date of Birth: 1951-03-22           MRN: [id number] Visit Date: 03/05/2021              Requested by: Denita Lung, MD [address] [place],  [place] [address] PCP: Denita Lung, MD   Assessment & Plan: Visit Diagnoses:  1. Pain in left hip     Plan: Impression is left hip continued pain to the greater trochanter as well as to the piriformis and gluteus minimus.  At this point, we will try a course of physical therapy.  Internal referral has been made.  If his symptoms do not improve over the next 4 to 6 weeks, he will call us and we will get an MRI of the left hip.  Call with concerns or questions in meantime.  Follow-Up Instructions: Return if symptoms worsen or fail to improve.   Orders:  Orders Placed This Encounter  Procedures  . Ambulatory referral to Physical Therapy   No orders of the defined types were placed in this encounter.     Procedures: No procedures performed   Clinical Data: No additional findings.   Subjective: Chief Complaint  Patient presents with  . Left Hip - Pain    HPI patient is a pleasant 70 year old gentleman who comes in today with left lateral hip pain for the past 6 months.  No known injury or change in activity.  The pain is primarily to the lateral aspect but occasionally radiates into the buttocks.  Pain is worse with walking a more than 2/10 of a mile as well as going up and down stairs or walking on uneven ground.  He has tried Aleve without relief of symptoms.  He denies any paresthesias or weakness to the lower extremity.  He notes that he has had 2 cortisone injections to the trochanteric bursa without relief.  He has not been to physical therapy.  No history of lumbar pathology.  Review of Systems as detailed in HPI.  All others reviewed and are negative.   Objective: Vital Signs: Ht 5' 8.5" (1.74 m)   Wt 164 lb (74.4 kg)   BMI 24.57 kg/m   Physical Exam  well-developed well-nourished gentleman in no acute distress.  Alert and oriented x3.  Ortho Exam left hip exam shows moderate tenderness to the greater trochanter.  He also has moderate tenderness to the piriformis and gluteus minimus.  Negative straight leg raise.  Negative logroll negative FADIR.  He is neurovascular intact distally.  Specialty Comments:  No specialty comments available.  Imaging: X-rays of the hip and back reviewed by me in canopy show minimal degenerative changes to the left hip.  No acute or structural abnormalities to the back.   PMFS History: Patient Active Problem List   Diagnosis Date Noted  . Aortic atherosclerosis ([place]) 02/25/2021  . Tubular adenoma of colon 11/30/2020  . Lactose intolerance 11/28/2019  . History of gout 11/28/2019  . Benign essential tremor 08/20/2017  . Seborrheic keratosis 07/17/2016  . Recovering alcoholic ([place]) 29/93/7169  . BPH (benign prostatic hyperplasia) 03/18/2016  . Depression 03/18/2016  . Hx of colonic polyps 03/08/2012  . GERD (gastroesophageal reflux disease) 03/08/2012  . History of hepatitis C 03/08/2012  . Perennial allergic rhinitis with seasonal variation 03/08/2012   Past Medical History:  Diagnosis Date  . Alcoholic ([place])   . Allergic rhinitis   .  Depression   . Former smoker    quit 2011  . GERD (gastroesophageal reflux disease)   . Hepatitis C    noticed when he tried to give blood; never had illness    Family History  Problem Relation Age of Onset  . Lung cancer Mother   . Autism Son     Past Surgical History:  Procedure Laterality Date  . [place] STUDY N/A 09/18/2014   Procedure: [place] STUDY;  Surgeon: Beryle Beams, MD;  Location: [facility] END[facility]COPY;  Service: Endoscopy;  Laterality: N/A;  . COLONOSCOPY    . ESOPHAGEAL MANOMETRY N/A 09/18/2014   Procedure: ESOPHAGEAL MANOMETRY (EM);  Surgeon: Beryle Beams, MD;  Location: [facility] END[facility];  Service: Endoscopy;  Laterality: N/A;  . SHOULDER  ARTHROSCOPY Left 04/2017  . VASECTOMY     Social History   Occupational History  . Occupation: retired (Counsellor)    Employer: [employer]  Tobacco Use  . Smoking status: Former Smoker    Quit date: 07/05/2011    Years since quitting: 9.6  . Smokeless tobacco: Current User    Types: Snuff  Substance and Sexual Activity  . Alcohol use: No    Comment: no alcohol since about 2000  . Drug use: No  . Sexual activity: Yes

## 2021-03-13 ENCOUNTER — Encounter: Payer: Self-pay | Admitting: Rehabilitative and Restorative Service Providers"

## 2021-03-13 ENCOUNTER — Other Ambulatory Visit: Payer: Self-pay

## 2021-03-13 ENCOUNTER — Ambulatory Visit: Payer: PPO | Admitting: Rehabilitative and Restorative Service Providers"

## 2021-03-13 DIAGNOSIS — M25552 Pain in left hip: Secondary | ICD-10-CM | POA: Diagnosis not present

## 2021-03-13 DIAGNOSIS — M25652 Stiffness of left hip, not elsewhere classified: Secondary | ICD-10-CM | POA: Diagnosis not present

## 2021-03-13 DIAGNOSIS — M6281 Muscle weakness (generalized): Secondary | ICD-10-CM

## 2021-03-13 DIAGNOSIS — R262 Difficulty in walking, not elsewhere classified: Secondary | ICD-10-CM

## 2021-03-13 NOTE — Therapy (Signed)
[facility] [address] [place], [place], [address] Phone: [phone]   Fax:  [phone]  Physical Therapy Evaluation  Patient Details  Name: Harold Day MRN: [id number] Date of Birth: 1950-11-23 Referring Provider (PT): Aundra Dubin   Encounter Date: 03/13/2021   PT End of Session - 03/13/21 1700    Visit Number 1    Number of Visits 8    Date for PT Re-Evaluation 05/08/21    PT Start Time 1601    PT Stop Time 1650    PT Time Calculation (min) 49 min    Activity Tolerance Patient tolerated treatment well    Behavior During Therapy [facility] for tasks assessed/performed           Past Medical History:  Diagnosis Date  . Alcoholic ([place])   . Allergic rhinitis   . Depression   . Former smoker    quit 2011  . GERD (gastroesophageal reflux disease)   . Hepatitis C    noticed when he tried to give blood; never had illness    Past Surgical History:  Procedure Laterality Date  . [place] STUDY N/A 09/18/2014   Procedure: [place] STUDY;  Surgeon: Beryle Beams, MD;  Location: [facility];  Service: Endoscopy;  Laterality: N/A;  . COLONOSCOPY    . ESOPHAGEAL MANOMETRY N/A 09/18/2014   Procedure: ESOPHAGEAL MANOMETRY (EM);  Surgeon: Beryle Beams, MD;  Location: [facility];  Service: Endoscopy;  Laterality: N/A;  . SHOULDER ARTHROSCOPY Left 04/2017  . VASECTOMY      There were no vitals filed for this visit.    Subjective Assessment - 03/13/21 1656    Subjective Harold Day has had increasing L hip pain over the past 6 months.  It has gotten to the point her can't play golf if he has to stay on the cart path as he can only walk "about 30 feet" before L hip pain increases.    Limitations Walking    How long can you walk comfortably? "About 30 feet"    Diagnostic tests X-Ray    Patient Stated Goals Be able to walk better for 2-3 mile/day exercise walks and golf    Currently in Pain? Yes    Pain Score 3     Pain Location Hip     Pain Orientation Left    Pain Descriptors / Indicators Aching;Burning;Sore;Tender    Pain Type Chronic pain    Pain Radiating Towards NA    Pain Onset More than a month ago    Pain Frequency Intermittent    Aggravating Factors  Walking    Pain Relieving Factors Rest    Effect of Pain on Daily Activities Limits comfortable walks to < 30 feet    Multiple Pain Sites No              [facility] PT Assessment - 03/13/21 0001      Assessment   Medical Diagnosis L hip pain    Referring Provider (PT) Aundra Dubin    Onset Date/Surgical Date --   ~6 months ago     Balance Screen   Has the patient fallen in the past 6 months No    Has the patient had a decrease in activity level because of a fear of falling?  No    Is the patient reluctant to leave their home because of a fear of falling?  No      Prior Function   Level of Independence  Independent    Leisure Golf and walking for exercise are very limited (30 feet of walking, was 2-3 miles)      Cognition   Overall Cognitive Status Within Functional Limits for tasks assessed      Observation/Other Assessments   Focus on Therapeutic Outcomes (FOTO)  49 (Goal 65)      ROM / Strength   AROM / PROM / Strength AROM;Strength      AROM   Overall AROM  Deficits    AROM Assessment Site Hip;Lumbar    Right/Left Hip Left;Right    Right Hip Flexion 90    Right Hip External Rotation  27    Right Hip Internal Rotation  7    Left Hip Flexion 80    Left Hip External Rotation  27    Left Hip Internal Rotation  7    Lumbar Extension 20      Strength   Overall Strength Deficits    Strength Assessment Site Hip    Right/Left Hip Left;Right    Right Hip Flexion 5/5    Right Hip ABduction 4-/5    Left Hip Flexion 4/5    Left Hip ABduction 3-/5      Flexibility   Soft Tissue Assessment /Muscle Length yes    Hamstrings 35 degrees L/40 degrees R                      Objective measurements completed on examination: See  above findings.       [facility] Adult PT Treatment/Exercise - 03/13/21 0001      Exercises   Exercises Knee/Hip      Knee/Hip Exercises: Stretches   Active Hamstring Stretch --    Hip Flexor Stretch Both;4 reps;20 seconds    ITB Stretch Both;4 reps;20 seconds;Limitations    ITB Stretch Limitations Hamstrings stretch with adduction and IR (no belt)    Piriformis Stretch Both;4 reps;20 seconds;Other (comment)    Piriformis Stretch Limitations Figure 4 and push knee away with same side hand      Knee/Hip Exercises: Standing   Other Standing Knee Exercises Trunk extension AROM 10X 3 seconds    Other Standing Knee Exercises Hip hike in doorway 2 sets of 10 for 3 seconds                  PT Education - 03/13/21 1659    Education Details Reviewed exam findings and starter HEP.    Person(s) Educated Patient    Methods Explanation;Demonstration;Tactile cues;Verbal cues;Handout    Comprehension Verbal cues required;Need further instruction;Returned demonstration;Verbalized understanding;Tactile cues required            PT Short Term Goals - 03/13/21 1703      PT SHORT TERM GOAL #1   Title Harold Day will be independent with his day 1 HEP.    Time 3    Period Weeks    Status New    Target Date 04/03/21             PT Long Term Goals - 03/13/21 1704      PT LONG TERM GOAL #1   Title Harold Day will report L hip pain consistently 0-3/10 on the Numeric Pain Rating Scale.    Baseline Can be 7+/10 with walking    Time 8    Period Weeks    Status New    Target Date 05/08/21      PT LONG TERM GOAL #2   Title Harold Day will improve  B hip flexibility for hip flexors to 100 degrees; hip ER to 40 degrees and hamstrings to 45 degrees.    Baseline 80-90; 27; 35-40 respectively    Time 8    Period Weeks    Status New    Target Date 05/08/21      PT LONG TERM GOAL #3   Title Harold Day will have improved hip strength as assessed by returning to golf, 1 mile exercise walks without an increase in pain  and FOTO at 65.    Baseline 49    Time 8    Period Weeks    Status New    Target Date 05/08/21      PT LONG TERM GOAL #4   Title Harold Day will be independent with his long-term HEP at DC.                  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  IT Band, and gluteus medius most involved.  With appropriate stretching, strengthening and avoiding overuse he should be 80-100% normal in 2 months.    Examination-Activity Limitations Locomotion Level    Examination-Participation Restrictions Community Activity    Stability/Clinical Decision Making Stable/Uncomplicated    Clinical Decision Making Low    Rehab Potential Good    PT Frequency 1x / week    PT Duration 8 weeks    PT Treatment/Interventions ADLs/Self Care Home Management;Iontophoresis 4mg /ml Dexamethasone;Cryotherapy;Therapeutic activities;Gait training;Therapeutic exercise;Neuromuscular re-education;Patient/family education;Manual techniques    PT Next Visit Plan Progress hip strength (emphasis on abductors and possibly extensors)    PT Home Exercise Plan Access Code: [email or web address]    Consulted and Agree with Plan of Care Patient           Patient will benefit from skilled therapeutic intervention in order to improve the following deficits and impairments:  Abnormal gait,Decreased activity tolerance,Decreased endurance,Decreased range of motion,Decreased strength,Difficulty walking,Impaired flexibility,Pain  Visit Diagnosis: Difficulty walking  Muscle weakness (generalized)  Stiffness of left hip, not elsewhere classified  Pain in left hip     Problem List Patient Active Problem List   Diagnosis Date Noted  . Aortic atherosclerosis ([place]) 02/25/2021  . Tubular adenoma of colon 11/30/2020  . Lactose intolerance 11/28/2019  . History of gout 11/28/2019  . Benign essential tremor 08/20/2017  . Seborrheic keratosis 07/17/2016  . Recovering alcoholic ([place])  70/10/7492  . BPH (benign prostatic hyperplasia) 03/18/2016  . Depression 03/18/2016  . Hx of colonic polyps 03/08/2012  . GERD (gastroesophageal reflux disease) 03/08/2012  . History of hepatitis C 03/08/2012  . Perennial allergic rhinitis with seasonal variation 03/08/2012    Farley Ly PT, MPT 03/13/2021, 5:11 PM  [facility] [address] [place], [place], [address] Phone: [phone]   Fax:  [phone]  Name: YUUKI SKEENS MRN: [id number] Date of Birth: [birth year]

## 2021-03-13 NOTE — Patient Instructions (Signed)
Access Code: [email or web address] URL: [email or web address] Date: 03/13/2021 Prepared by: Vista Mink  Exercises Single Knee to Chest Stretch - 2-3 x daily - 7 x weekly - 1 sets - 5 reps - 20 seconds hold Supine Hamstring Stretch - 2-3 x daily - 7 x weekly - 1 sets - 5 reps - 20 seconds hold Supine Figure 4 Piriformis Stretch - 2-3 x daily - 7 x weekly - 1 sets - 5 reps - 20 seconds hold Standing Hip Hiking - 2-3 x daily - 7 x weekly - 2 sets - 10 reps - 3 seconds hold Standing Lumbar Extension at [place] - 5 x daily - 7 x weekly - 1 sets - 5 reps - 3 seconds hold

## 2021-03-29 ENCOUNTER — Ambulatory Visit: Payer: PPO | Admitting: Rehabilitative and Restorative Service Providers"

## 2021-03-29 ENCOUNTER — Encounter: Payer: Self-pay | Admitting: Rehabilitative and Restorative Service Providers"

## 2021-03-29 ENCOUNTER — Other Ambulatory Visit: Payer: Self-pay

## 2021-03-29 DIAGNOSIS — R262 Difficulty in walking, not elsewhere classified: Secondary | ICD-10-CM | POA: Diagnosis not present

## 2021-03-29 DIAGNOSIS — M6281 Muscle weakness (generalized): Secondary | ICD-10-CM

## 2021-03-29 DIAGNOSIS — M25652 Stiffness of left hip, not elsewhere classified: Secondary | ICD-10-CM

## 2021-03-29 DIAGNOSIS — M25552 Pain in left hip: Secondary | ICD-10-CM

## 2021-03-29 NOTE — Therapy (Signed)
[facility] [address] [place], [place], [address] Phone: [phone]   Fax:  [phone]  Physical Therapy Treatment  Patient Details  Name: Harold Day MRN: [id number] Date of Birth: 17-Aug-1951 Referring Provider (PT): Aundra Dubin   Encounter Date: 03/29/2021   PT End of Session - 03/29/21 1732     Visit Number 2    Number of Visits 8    Date for PT Re-Evaluation 05/08/21    PT Start Time 0930    PT Stop Time 2595    PT Time Calculation (min) 45 min    Activity Tolerance Patient tolerated treatment well;No increased pain;Patient limited by fatigue    Behavior During Therapy [facility] for tasks assessed/performed             Past Medical History:  Diagnosis Date   Alcoholic ([place])    Allergic rhinitis    Depression    Former smoker    quit 2011   GERD (gastroesophageal reflux disease)    Hepatitis C    noticed when he tried to give blood; never had illness    Past Surgical History:  Procedure Laterality Date   24 HOUR [place] STUDY N/A 09/18/2014   Procedure: 24 HOUR [place] STUDY;  Surgeon: Beryle Beams, MD;  Location: [facility];  Service: Endoscopy;  Laterality: N/A;   COLONOSCOPY     ESOPHAGEAL MANOMETRY N/A 09/18/2014   Procedure: ESOPHAGEAL MANOMETRY (EM);  Surgeon: Beryle Beams, MD;  Location: [facility];  Service: Endoscopy;  Laterality: N/A;   SHOULDER ARTHROSCOPY Left 04/2017   VASECTOMY      There were no vitals filed for this visit.   Subjective Assessment - 03/29/21 0939     Subjective [name] reports good early compliance with his HEP.  He admits missing Memorial Day and a few 1X/day (2X/day recommended).    Limitations Walking    How long can you walk comfortably? "About 30 feet"    Diagnostic tests X-Ray    Patient Stated Goals Be able to walk better for 2-3 mile/day exercise walks and golf    Currently in Pain? Yes    Pain Score 3     Pain Location Hip    Pain Orientation Left    Pain Descriptors /  Indicators Aching;Sore;Tightness    Pain Type Chronic pain    Pain Radiating Towards NA    Pain Onset More than a month ago    Pain Frequency Intermittent    Aggravating Factors  Walking    Pain Relieving Factors Rest    Effect of Pain on Daily Activities Limits standing and walking endurance (walking about 30 feet)    Multiple Pain Sites No                               [facility] Adult PT Treatment/Exercise - 03/29/21 0001       Therapeutic Activites    Therapeutic Activities Other Therapeutic Activities    Other Therapeutic Activities Steps next visit      Neuro Re-ed    Neuro Re-ed Details  Next visit      Exercises   Exercises Knee/Hip      Knee/Hip Exercises: Stretches   Hip Flexor Stretch Both;4 reps;20 seconds    ITB Stretch Both;4 reps;20 seconds;Limitations    ITB Stretch Limitations Hamstrings stretch with adduction and IR (no belt)    Piriformis Stretch Both;4 reps;20 seconds;Other (comment)  Piriformis Stretch Limitations Figure 4 and push knee away with same side hand      Knee/Hip Exercises: Machines for Strengthening   Total Gym Leg Press 2 sets of 10 L and 1 set of 10 R 62# slow eccentrics      Knee/Hip Exercises: Standing   Other Standing Knee Exercises Trunk extension AROM 10X 3 seconds    Other Standing Knee Exercises Hip hike in doorway 2 sets of 10 for 3 seconds      Knee/Hip Exercises: Sidelying   Hip ABduction Strengthening;Left;3 sets;10 reps;Limitations    Hip ABduction Limitations 1/4 turn to stomach 3 seconds      Knee/Hip Exercises: Prone   Hip Extension Strengthening;Both;3 sets;10 reps;Limitations    Hip Extension Limitations 3 seconds                    PT Education - 03/29/21 1731     Education Details Reviewed HEP with feddback and correction provided.  Added to strengthening component.    Person(s) Educated Patient    Methods Explanation;Demonstration;Tactile cues;Verbal cues;Handout    Comprehension  Verbal cues required;Need further instruction;Returned demonstration;Verbalized understanding;Tactile cues required              PT Short Term Goals - 03/29/21 1731       PT SHORT TERM GOAL #1   Title [name] will be independent with his day 1 HEP.    Time 3    Period Weeks    Status On-going    Target Date 04/03/21               PT Long Term Goals - 03/29/21 1731       PT LONG TERM GOAL #1   Title [name] will report L hip pain consistently 0-3/10 on the Numeric Pain Rating Scale.    Baseline Can be 7+/10 with walking    Time 8    Period Weeks    Status On-going      PT LONG TERM GOAL #2   Title [name] will improve B hip flexibility for hip flexors to 100 degrees; hip ER to 40 degrees and hamstrings to 45 degrees.    Baseline 80-90; 27; 35-40 respectively    Time 8    Period Weeks    Status On-going      PT LONG TERM GOAL #3   Title [name] will have improved hip strength as assessed by returning to golf, 1 mile exercise walks without an increase in pain and FOTO at 65.    Baseline 49    Time 8    Period Weeks    Status On-going      PT LONG TERM GOAL #4   Title [name] will be independent with his long-term HEP at DC.    Status On-going                   Plan - 03/29/21 1732     Clinical Impression Statement L hip abductors weakness and poor low back strength are most functionally limiting.  [name] reports good HEP compliance, although he admits it could be better.  Added to his strength program due to significant L sided weakness.  I expect 80+% improvement with 2 months of consistent HEP and PT.    Examination-Activity Limitations Locomotion Level    Examination-Participation Restrictions Community Activity    Stability/Clinical Decision Making Stable/Uncomplicated    Rehab Potential Good    PT Frequency 1x / week    PT  Duration 8 weeks    PT Treatment/Interventions ADLs/Self Care Home Management;Iontophoresis 4mg /ml Dexamethasone;Cryotherapy;Therapeutic  activities;Gait training;Therapeutic exercise;Neuromuscular re-education;Patient/family education;Manual techniques    PT Next Visit Plan Progress hip strength (emphasis on abductors and possibly extensors)    PT Home Exercise Plan Access Code: [email or web address]    Consulted and Agree with Plan of Care Patient             Patient will benefit from skilled therapeutic intervention in order to improve the following deficits and impairments:  Abnormal gait, Decreased activity tolerance, Decreased endurance, Decreased range of motion, Decreased strength, Difficulty walking, Impaired flexibility, Pain  Visit Diagnosis: Difficulty walking  Muscle weakness (generalized)  Stiffness of left hip, not elsewhere classified  Pain in left hip     Problem List Patient Active Problem List   Diagnosis Date Noted   Aortic atherosclerosis ([place]) 02/25/2021   Tubular adenoma of colon 11/30/2020   Lactose intolerance 11/28/2019   History of gout 11/28/2019   Benign essential tremor 08/20/2017   Seborrheic keratosis 07/17/2016   Recovering alcoholic ([place]) 55/21/7471   BPH (benign prostatic hyperplasia) 03/18/2016   Depression 03/18/2016   Hx of colonic polyps 03/08/2012   GERD (gastroesophageal reflux disease) 03/08/2012   History of hepatitis C 03/08/2012   Perennial allergic rhinitis with seasonal variation 03/08/2012    Farley Ly PT, MPT 03/29/2021, 5:35 PM  [facility] [address] [place], [place], [address] Phone: [phone]   Fax:  [phone]  Name: ELZA SORTOR MRN: [id number] Date of Birth: Sep 24, 1951

## 2021-03-29 NOTE — Patient Instructions (Signed)
Access Code: [email or web address] URL: [email or web address] Date: 03/29/2021 Prepared by: Vista Mink  Exercises Single Knee to Chest Stretch - 2-3 x daily - 7 x weekly - 1 sets - 5 reps - 20 seconds hold Supine Hamstring Stretch - 2-3 x daily - 7 x weekly - 1 sets - 5 reps - 20 seconds hold Supine Figure 4 Piriformis Stretch - 2-3 x daily - 7 x weekly - 1 sets - 5 reps - 20 seconds hold Standing Hip Hiking - 2 x daily - 7 x weekly - 2 sets - 10 reps - 3 seconds hold Standing Lumbar Extension at Wall - Forearms - 5 x daily - 7 x weekly - 1 sets - 5 reps - 3 seconds hold Sidelying Hip Abduction - 1 x daily - 7 x weekly - 2-3 sets - 10 reps - 3 seconds hold Prone Hip Extension - 1 x daily - 7 x weekly - 2-3 sets - 10 reps - 3 seconds hold

## 2021-04-03 ENCOUNTER — Other Ambulatory Visit: Payer: Self-pay

## 2021-04-03 ENCOUNTER — Ambulatory Visit: Payer: PPO | Admitting: Rehabilitative and Restorative Service Providers"

## 2021-04-03 ENCOUNTER — Encounter: Payer: Self-pay | Admitting: Rehabilitative and Restorative Service Providers"

## 2021-04-03 DIAGNOSIS — M25652 Stiffness of left hip, not elsewhere classified: Secondary | ICD-10-CM | POA: Diagnosis not present

## 2021-04-03 DIAGNOSIS — M25552 Pain in left hip: Secondary | ICD-10-CM

## 2021-04-03 DIAGNOSIS — R262 Difficulty in walking, not elsewhere classified: Secondary | ICD-10-CM | POA: Diagnosis not present

## 2021-04-03 DIAGNOSIS — M6281 Muscle weakness (generalized): Secondary | ICD-10-CM

## 2021-04-03 NOTE — Therapy (Signed)
[facility] [address] [place], [place], [address] Phone: [phone]   Fax:  [phone]  Physical Therapy Treatment  Patient Details  Name: Harold Day MRN: [id number] Date of Birth: 1951/05/15 Referring Provider (PT): Aundra Dubin   Encounter Date: 04/03/2021   PT End of Session - 04/03/21 1649     Visit Number 3    Number of Visits 8    Date for PT Re-Evaluation 05/08/21    PT Start Time 2878    PT Stop Time 6767    PT Time Calculation (min) 43 min    Activity Tolerance Patient tolerated treatment well;No increased pain;Patient limited by fatigue    Behavior During Therapy [facility] for tasks assessed/performed             Past Medical History:  Diagnosis Date   Alcoholic ([place])    Allergic rhinitis    Depression    Former smoker    quit 2011   GERD (gastroesophageal reflux disease)    Hepatitis C    noticed when he tried to give blood; never had illness    Past Surgical History:  Procedure Laterality Date   24 HOUR [place] STUDY N/A 09/18/2014   Procedure: 24 HOUR [place] STUDY;  Surgeon: Beryle Beams, MD;  Location: [facility];  Service: Endoscopy;  Laterality: N/A;   COLONOSCOPY     ESOPHAGEAL MANOMETRY N/A 09/18/2014   Procedure: ESOPHAGEAL MANOMETRY (EM);  Surgeon: Beryle Beams, MD;  Location: [facility];  Service: Endoscopy;  Laterality: N/A;   SHOULDER ARTHROSCOPY Left 04/2017   VASECTOMY      There were no vitals filed for this visit.   Subjective Assessment - 04/03/21 1645     Subjective Harold Day is doing his home strengthening 3-4X/week (daily recommended).  Stretching consistently 2X/day.    Limitations Walking    How long can you walk comfortably? "About 30 feet"    Diagnostic tests X-Ray    Patient Stated Goals Be able to walk better for 2-3 mile/day exercise walks and golf    Currently in Pain? Yes    Pain Score 3     Pain Location Hip    Pain Orientation Left    Pain Descriptors / Indicators  Aching;Sore;Tightness    Pain Type Chronic pain    Pain Radiating Towards NA    Pain Onset More than a month ago    Pain Frequency Intermittent    Aggravating Factors  Walking    Pain Relieving Factors Rest    Effect of Pain on Daily Activities Limits standing and walking endurance (walking about 30 feet)    Multiple Pain Sites No                               [facility] Adult PT Treatment/Exercise - 04/03/21 0001       Therapeutic Activites    Other Therapeutic Activities Next visit      Neuro Re-ed    Neuro Re-ed Details  Tandem eyes open/head turn/eyes closed 3X 20 seconds each      Exercises   Exercises Knee/Hip      Knee/Hip Exercises: Stretches   Hip Flexor Stretch Both;4 reps;20 seconds    ITB Stretch Both;4 reps;20 seconds;Limitations    ITB Stretch Limitations Hamstrings stretch with adduction and IR (no belt)    Piriformis Stretch Both;4 reps;20 seconds;Other (comment)    Piriformis Stretch Limitations Figure 4 and push knee  away with same side hand      Knee/Hip Exercises: Machines for Strengthening   Total Gym Leg Press 2 sets of 10 L and 1 set of 10 R 56# slow eccentrics      Knee/Hip Exercises: Standing   Other Standing Knee Exercises Trunk extension AROM 10X 3 seconds    Other Standing Knee Exercises Hip hike in doorway 2 sets of 10 for 3 seconds      Knee/Hip Exercises: Sidelying   Hip ABduction Strengthening;Left;3 sets;10 reps;Limitations    Hip ABduction Limitations 1/4 turn to stomach 3 seconds      Knee/Hip Exercises: Prone   Hip Extension Strengthening;Both;3 sets;10 reps;Limitations    Hip Extension Limitations 3 seconds                    PT Education - 04/03/21 1648     Education Details Reviewed HEP with hip abductor strength progressions and reminders to do strengthening 1X/Day (currently 3-4X/week).    Person(s) Educated Patient    Methods Explanation;Demonstration;Tactile cues;Verbal cues;Handout     Comprehension Verbal cues required;Returned demonstration;Need further instruction;Tactile cues required;Verbalized understanding              PT Short Term Goals - 04/03/21 1649       PT SHORT TERM GOAL #1   Title Harold Day will be independent with his day 1 HEP.    Time 3    Period Weeks    Status Achieved    Target Date 04/03/21               PT Long Term Goals - 04/03/21 1649       PT LONG TERM GOAL #1   Title Harold Day will report L hip pain consistently 0-3/10 on the Numeric Pain Rating Scale.    Baseline Can be 7+/10 with walking    Time 8    Period Weeks    Status On-going      PT LONG TERM GOAL #2   Title Harold Day will improve B hip flexibility for hip flexors to 100 degrees; hip ER to 40 degrees and hamstrings to 45 degrees.    Baseline 80-90; 27; 35-40 respectively    Time 8    Period Weeks    Status On-going      PT LONG TERM GOAL #3   Title Harold Day will have improved hip strength as assessed by returning to golf, 1 mile exercise walks without an increase in pain and FOTO at 65.    Baseline 49    Time 8    Period Weeks    Status On-going      PT LONG TERM GOAL #4   Title Harold Day will be independent with his long-term HEP at DC.    Status On-going                   Plan - 04/03/21 1650     Clinical Impression Statement L hip abductors and low back strengthening remains the focus of Harold Day's physical therapy.  Compliance is good but not consistent enough with strengthening (3-4X/week, daily recommended).  With increased strengthening compliance and continued supervised PT, Harold Day should meet goals established at evaluation.    Examination-Activity Limitations Locomotion Level    Examination-Participation Restrictions Community Activity    Stability/Clinical Decision Making Stable/Uncomplicated    Rehab Potential Good    PT Frequency 1x / week    PT Duration 8 weeks    PT Treatment/Interventions ADLs/Self Care Home Management;Iontophoresis 4mg /ml  Dexamethasone;Cryotherapy;Therapeutic activities;Gait training;Therapeutic exercise;Neuromuscular re-education;Patient/family education;Manual techniques    PT Next Visit Plan Progress hip strength and function (emphasis on abductors)    PT Home Exercise Plan Access Code: [email or web address]    Consulted and Agree with Plan of Care Patient             Patient will benefit from skilled therapeutic intervention in order to improve the following deficits and impairments:  Abnormal gait, Decreased activity tolerance, Decreased endurance, Decreased range of motion, Decreased strength, Difficulty walking, Impaired flexibility, Pain  Visit Diagnosis: Difficulty walking  Muscle weakness (generalized)  Stiffness of left hip, not elsewhere classified  Pain in left hip     Problem List Patient Active Problem List   Diagnosis Date Noted   Aortic atherosclerosis ([place]) 02/25/2021   Tubular adenoma of colon 11/30/2020   Lactose intolerance 11/28/2019   History of gout 11/28/2019   Benign essential tremor 08/20/2017   Seborrheic keratosis 07/17/2016   Recovering alcoholic ([place]) 21/08/5519   BPH (benign prostatic hyperplasia) 03/18/2016   Depression 03/18/2016   Hx of colonic polyps 03/08/2012   GERD (gastroesophageal reflux disease) 03/08/2012   History of hepatitis C 03/08/2012   Perennial allergic rhinitis with seasonal variation 03/08/2012    Farley Ly PT, MPT 04/03/2021, 4:52 PM  [facility] [address] [place], [place], [address] Phone: [phone]   Fax:  [phone]  Name: KIMMY PARISH MRN: [id number] Date of Birth: [birth year]

## 2021-04-11 ENCOUNTER — Encounter: Payer: Self-pay | Admitting: Rehabilitative and Restorative Service Providers"

## 2021-04-11 ENCOUNTER — Other Ambulatory Visit: Payer: Self-pay

## 2021-04-11 ENCOUNTER — Ambulatory Visit (INDEPENDENT_AMBULATORY_CARE_PROVIDER_SITE_OTHER): Payer: PPO | Admitting: Rehabilitative and Restorative Service Providers"

## 2021-04-11 DIAGNOSIS — M25652 Stiffness of left hip, not elsewhere classified: Secondary | ICD-10-CM | POA: Diagnosis not present

## 2021-04-11 DIAGNOSIS — R262 Difficulty in walking, not elsewhere classified: Secondary | ICD-10-CM | POA: Diagnosis not present

## 2021-04-11 DIAGNOSIS — M25552 Pain in left hip: Secondary | ICD-10-CM | POA: Diagnosis not present

## 2021-04-11 DIAGNOSIS — M6281 Muscle weakness (generalized): Secondary | ICD-10-CM | POA: Diagnosis not present

## 2021-04-11 NOTE — Patient Instructions (Signed)
Continue current HEP with daily stretching and 5-6X/week hip abductors focused strengthening.

## 2021-04-11 NOTE — Therapy (Signed)
[facility] [address] [place], [place], [address] Phone: [phone]   Fax:  [phone]  Physical Therapy Treatment/Reassessment  Patient Details  Name: Harold Day MRN: [id number] Date of Birth: Aug 19, 1951 Referring Provider (PT): Aundra Dubin   Encounter Date: 04/11/2021   PT End of Session - 04/11/21 1621     Visit Number 4    Number of Visits 8    Date for PT Re-Evaluation 05/08/21    PT Start Time 1310    PT Stop Time 1400    PT Time Calculation (min) 50 min    Activity Tolerance Patient tolerated treatment well;No increased pain;Patient limited by fatigue    Behavior During Therapy [facility] for tasks assessed/performed             Past Medical History:  Diagnosis Date   Alcoholic ([place])    Allergic rhinitis    Depression    Former smoker    quit 2011   GERD (gastroesophageal reflux disease)    Hepatitis C    noticed when he tried to give blood; never had illness    Past Surgical History:  Procedure Laterality Date   24 HOUR [place] STUDY N/A 09/18/2014   Procedure: 24 HOUR [place] STUDY;  Surgeon: Beryle Beams, MD;  [facility];  Service: Endoscopy;  Laterality: N/A;   COLONOSCOPY     ESOPHAGEAL MANOMETRY N/A 09/18/2014   Procedure: ESOPHAGEAL MANOMETRY (EM);  Surgeon: Beryle Beams, MD;  [facility];  Service: Endoscopy;  Laterality: N/A;   SHOULDER ARTHROSCOPY Left 04/2017   VASECTOMY      There were no vitals filed for this visit.   Subjective Assessment - 04/11/21 1604     Subjective Harold Day is now doing his home strengthening 6X/week (daily recommended).  Stretching consistently 2X/day.  He feels more flexible but pain still limits standing and walking.    Limitations Walking    How long can you walk comfortably? "About 30 feet"    Diagnostic tests X-Ray    Patient Stated Goals Be able to walk better for 2-3 mile/day exercise walks and golf    Currently in Pain? Yes    Pain Score 3      Pain Location Hip    Pain Orientation Left    Pain Descriptors / Indicators Aching;Sore;Tightness    Pain Type Chronic pain    Pain Radiating Towards NA    Pain Onset More than a month ago    Pain Frequency Intermittent    Aggravating Factors  Walking    Pain Relieving Factors Rest    Effect of Pain on Daily Activities Limits standing and walking endurance (walking about 30 feet)    Multiple Pain Sites No                [facility] PT Assessment - 04/11/21 0001       AROM   Right Hip Flexion 100    Right Hip External Rotation  36    Right Hip Internal Rotation  12    Left Hip Flexion 90    Left Hip External Rotation  39    Left Hip Internal Rotation  10      Flexibility   Soft Tissue Assessment /Muscle Length yes    Hamstrings 40 degrees/40 degrees                           [facility] Adult PT Treatment/Exercise - 04/11/21 0001  Therapeutic Activites    Other Therapeutic Activities Step-up and over with slow eccentrics and hips level 6 inch step      Neuro Re-ed    Neuro Re-ed Details  Tandem eyes open/head turn/eyes closed 3X 20 seconds each      Exercises   Exercises Knee/Hip      Knee/Hip Exercises: Stretches   Hip Flexor Stretch Both;4 reps;20 seconds    ITB Stretch Both;4 reps;20 seconds;Limitations    ITB Stretch Limitations Hamstrings stretch with adduction and IR (no belt)    Piriformis Stretch Both;4 reps;20 seconds;Other (comment)    Piriformis Stretch Limitations Figure 4 and push knee away with same side hand      Knee/Hip Exercises: Machines for Strengthening   Total Gym Leg Press 2 sets of 15 L and 1 sets of 15 R slow eccentrics full extension and slow flexion 56#      Knee/Hip Exercises: Standing   Other Standing Knee Exercises HEP    Other Standing Knee Exercises Hip hike in doorway 2 sets of 10 for 5 seconds      Knee/Hip Exercises: Sidelying   Hip ABduction Strengthening;Left;2 sets;10 reps;Limitations    Hip ABduction  Limitations 1/4 turn to stomach 3 seconds      Knee/Hip Exercises: Prone   Hip Extension Strengthening;Both;2 sets;10 reps;Limitations    Hip Extension Limitations 5 seconds                    PT Education - 04/11/21 1619     Education Details Reviewed exercises and RA findings.    Person(s) Educated Patient    Methods Explanation;Demonstration;Tactile cues;Verbal cues    Comprehension Returned demonstration;Need further instruction;Verbal cues required;Verbalized understanding;Tactile cues required              PT Short Term Goals - 04/11/21 1620       PT SHORT TERM GOAL #1   Title Harold Day will be independent with his day 1 HEP.    Time 3    Period Weeks    Status Achieved    Target Date 04/03/21               PT Long Term Goals - 04/11/21 1620       PT LONG TERM GOAL #1   Title Harold Day will report L hip pain consistently 0-3/10 on the Numeric Pain Rating Scale.    Baseline Can be 7+/10 with walking    Time 8    Period Weeks    Status On-going      PT LONG TERM GOAL #2   Title Harold Day will improve B hip flexibility for hip flexors to 100 degrees; hip ER to 40 degrees and hamstrings to 45 degrees.    Baseline 90-100; 36-39; 40 (was 80-90; 27; 35-40) respectively    Time 8    Period Weeks    Status Partially Met      PT LONG TERM GOAL #3   Title Harold Day will have improved hip strength as assessed by returning to golf, 1 mile exercise walks without an increase in pain and FOTO at 65.    Baseline 49    Time 8    Period Weeks    Status On-going      PT LONG TERM GOAL #4   Title Harold Day will be independent with his long-term HEP at DC.    Status On-going  Plan - 04/11/21 1622     Clinical Impression Statement Harold Day is making objective progress towards long-term goals established at evaluation.  Hip AROM and strength are improving, although no functional progress is noted.  Given good early objective progress and remaining impairments that  match his functional complaints, I recommend Harold Day continue his pysical therapy as recommended at evaluation for another 4 weeks with RA at that time to make additional recommendations.    Examination-Activity Limitations Locomotion Level    Examination-Participation Restrictions Community Activity    Stability/Clinical Decision Making Stable/Uncomplicated    Rehab Potential Good    PT Frequency 1x / week    PT Duration 4 weeks    PT Treatment/Interventions ADLs/Self Care Home Management;Iontophoresis 82m/ml Dexamethasone;Cryotherapy;Therapeutic activities;Gait training;Therapeutic exercise;Neuromuscular re-education;Patient/family education;Manual techniques    PT Next Visit Plan Progress hip strength and function (emphasis on abductors)    PT Home Exercise Plan Access Code: [email or web address]    Consulted and Agree with Plan of Care Patient             Patient will benefit from skilled therapeutic intervention in order to improve the following deficits and impairments:  Abnormal gait, Decreased activity tolerance, Decreased endurance, Decreased range of motion, Decreased strength, Difficulty walking, Impaired flexibility, Pain  Visit Diagnosis: Difficulty walking  Muscle weakness (generalized)  Stiffness of left hip, not elsewhere classified  Pain in left hip     Problem List Patient Active Problem List   Diagnosis Date Noted   Aortic atherosclerosis ([place] 02/25/2021   Tubular adenoma of colon 11/30/2020   Lactose intolerance 11/28/2019   History of gout 11/28/2019   Benign essential tremor 08/20/2017   Seborrheic keratosis 07/17/2016   Recovering alcoholic ([place] 002/23/3612  BPH (benign prostatic hyperplasia) 03/18/2016   Depression 03/18/2016   Hx of colonic polyps 03/08/2012   GERD (gastroesophageal reflux disease) 03/08/2012   History of hepatitis C 03/08/2012   Perennial allergic rhinitis with seasonal variation 03/08/2012    RFarley LyPT, MPT 04/11/2021, 4:26  PM  [facility] [address] [address]Phone: [phone]  Fax:  [phone] Name: JSLADEN PLANCARTEMRN: [id number] of Birth: 7Oct 06, 1952

## 2021-04-18 ENCOUNTER — Other Ambulatory Visit: Payer: Self-pay

## 2021-04-18 ENCOUNTER — Encounter: Payer: Self-pay | Admitting: Rehabilitative and Restorative Service Providers"

## 2021-04-18 ENCOUNTER — Ambulatory Visit: Payer: PPO | Admitting: Rehabilitative and Restorative Service Providers"

## 2021-04-18 DIAGNOSIS — R262 Difficulty in walking, not elsewhere classified: Secondary | ICD-10-CM

## 2021-04-18 DIAGNOSIS — M25652 Stiffness of left hip, not elsewhere classified: Secondary | ICD-10-CM

## 2021-04-18 DIAGNOSIS — M25552 Pain in left hip: Secondary | ICD-10-CM | POA: Diagnosis not present

## 2021-04-18 DIAGNOSIS — M6281 Muscle weakness (generalized): Secondary | ICD-10-CM

## 2021-04-18 NOTE — Patient Instructions (Signed)
Updated HEP with decreased strength participation to 3X/week to avoid overuse between HEP, golf and the gym

## 2021-04-18 NOTE — Therapy (Signed)
[facility] [address] [place], [place], [address] Phone: [phone]   Fax:  [phone]  Physical Therapy Treatment  Patient Details  Name: Harold Day MRN: [id number] Date of Birth: 1951-02-26 Referring Provider (PT): Aundra Dubin   Encounter Date: 04/18/2021   PT End of Session - 04/18/21 1702     Visit Number 5    Number of Visits 8    Date for PT Re-Evaluation 05/08/21    PT Start Time 9872    PT Stop Time 1603    PT Time Calculation (min) 45 min    Activity Tolerance Patient tolerated treatment well;No increased pain;Patient limited by fatigue    Behavior During Therapy [facility] for tasks assessed/performed             Past Medical History:  Diagnosis Date   Alcoholic ([place])    Allergic rhinitis    Depression    Former smoker    quit 2011   GERD (gastroesophageal reflux disease)    Hepatitis C    noticed when he tried to give blood; never had illness    Past Surgical History:  Procedure Laterality Date   24 HOUR [place] STUDY N/A 09/18/2014   Procedure: 24 HOUR [place] STUDY;  Surgeon: Beryle Beams, MD;  [facility];  Service: Endoscopy;  Laterality: N/A;   COLONOSCOPY     ESOPHAGEAL MANOMETRY N/A 09/18/2014   Procedure: ESOPHAGEAL MANOMETRY (EM);  Surgeon: Beryle Beams, MD;  [facility];  Service: Endoscopy;  Laterality: N/A;   SHOULDER ARTHROSCOPY Left 04/2017   VASECTOMY      There were no vitals filed for this visit.   Subjective Assessment - 04/18/21 1658     Subjective [name] reports working hard at [employer] and at home.  He is playing better golf, but is still limited with WB.    Limitations Walking    How long can you walk comfortably? "About 30 feet"    Diagnostic tests X-Ray    Patient Stated Goals Be able to walk better for 2-3 mile/day exercise walks and golf    Currently in Pain? Yes    Pain Score 4     Pain Location Hip    Pain Orientation Left    Pain Descriptors / Indicators  Aching;Sore    Pain Type Chronic pain    Pain Radiating Towards NA    Pain Onset More than a month ago    Pain Frequency Intermittent    Aggravating Factors  Standing and walking    Pain Relieving Factors Rest    Effect of Pain on Daily Activities Limits comfortable standing and walking (30 feet)    Multiple Pain Sites No                               [facility] Adult PT Treatment/Exercise - 04/18/21 0001       Therapeutic Activites    Other Therapeutic Activities Step-up and over with slow concentrics, eccentrics and hips level 6 inch step      Neuro Re-ed    Neuro Re-ed Details  Tandem eyes open 5X 30 seconds      Exercises   Exercises Knee/Hip      Knee/Hip Exercises: Stretches   Hip Flexor Stretch Both;4 reps;20 seconds    ITB Stretch Both;4 reps;20 seconds;Limitations    ITB Stretch Limitations Hamstrings stretch with adduction and IR (no belt)  Piriformis Stretch Both;4 reps;20 seconds;Other (comment)    Piriformis Stretch Limitations Figure 4 and push knee away with same side hand      Knee/Hip Exercises: Machines for Strengthening   Total Gym Leg Press 1 set of 20 L leg only slow concentric and eccentric 56#      Knee/Hip Exercises: Standing   Other Standing Knee Exercises Hip hike in doorway 2 sets of 10 for 5 seconds & hip abduction with pelvic stabilization 2 sets of 5 for 3 seconds      Knee/Hip Exercises: Sidelying   Hip ABduction Strengthening;Left;2 sets;10 reps;Limitations    Hip ABduction Limitations 1/4 turn from stomach 3 seconds      Knee/Hip Exercises: Prone   Hip Extension Strengthening;Both;2 sets;10 reps;Limitations    Hip Extension Limitations 5 seconds                    PT Education - 04/18/21 1701     Education Details Gave corrective feedback with hip abductors strengthening, reduced strength recommendations to 3X/week from daily to avoid overuse.    Person(s) Educated Patient    Methods  Explanation;Demonstration;Tactile cues;Verbal cues;Handout    Comprehension Need further instruction;Returned demonstration;Verbalized understanding;Tactile cues required;Verbal cues required              PT Short Term Goals - 04/11/21 1620       PT SHORT TERM GOAL #1   Title Harold Day will be independent with his day 1 HEP.    Time 3    Period Weeks    Status Achieved    Target Date 04/03/21               PT Long Term Goals - 04/18/21 1702       PT LONG TERM GOAL #1   Title Harold Day will report L hip pain consistently 0-3/10 on the Numeric Pain Rating Scale.    Baseline Can be 7+/10 with walking    Time 8    Period Weeks    Status On-going    Target Date 05/16/21      PT LONG TERM GOAL #2   Title Harold Day will improve B hip flexibility for hip flexors to 100 degrees; hip ER to 40 degrees and hamstrings to 45 degrees.    Baseline 90-100; 36-39; 40 (was 80-90; 27; 35-40) respectively    Time 8    Period Weeks    Status Partially Met    Target Date 05/16/21      PT LONG TERM GOAL #3   Title Harold Day will have improved hip strength as assessed by returning to golf, 1 mile exercise walks without an increase in pain and FOTO at 65.    Baseline 49    Time 8    Period Weeks    Status On-going    Target Date 05/16/21      PT LONG TERM GOAL #4   Title Harold Day will be independent with his long-term HEP at DC.    Status On-going    Target Date 05/16/21                   Plan - 04/18/21 1703     Clinical Impression Statement Harold Day needed correction with his hip abductors strengthening.  He is playing better golf but still is limited with his comfortable standing and walking.  Between technique corrections and decreasing strength exercises to 3X/week (to avoid overuse with golf and gym activities), I expect him to make more rapid  progress.    Examination-Activity Limitations Locomotion Level    Examination-Participation Restrictions Community Activity    Stability/Clinical Decision  Making Stable/Uncomplicated    Rehab Potential Good    PT Frequency 1x / week    PT Duration 4 weeks    PT Treatment/Interventions ADLs/Self Care Home Management;Iontophoresis 15m/ml Dexamethasone;Cryotherapy;Therapeutic activities;Gait training;Therapeutic exercise;Neuromuscular re-education;Patient/family education;Manual techniques    PT Next Visit Plan Progress hip strength and function (emphasis on abductors)    PT Home Exercise Plan Access Code: [email or web address]    Consulted and Agree with Plan of Care Patient             Patient will benefit from skilled therapeutic intervention in order to improve the following deficits and impairments:  Abnormal gait, Decreased activity tolerance, Decreased endurance, Decreased range of motion, Decreased strength, Difficulty walking, Impaired flexibility, Pain  Visit Diagnosis: Difficulty walking  Muscle weakness (generalized)  Stiffness of left hip, not elsewhere classified  Pain in left hip     Problem List Patient Active Problem List   Diagnosis Date Noted   Aortic atherosclerosis ([place] 02/25/2021   Tubular adenoma of colon 11/30/2020   Lactose intolerance 11/28/2019   History of gout 11/28/2019   Benign essential tremor 08/20/2017   Seborrheic keratosis 07/17/2016   Recovering alcoholic ([place] 068/59/9234  BPH (benign prostatic hyperplasia) 03/18/2016   Depression 03/18/2016   Hx of colonic polyps 03/08/2012   GERD (gastroesophageal reflux disease) 03/08/2012   History of hepatitis C 03/08/2012   Perennial allergic rhinitis with seasonal variation 03/08/2012    RFarley LyPT, MPT 04/18/2021, 5:06 PM  [facility] [address] [address]Phone: [phone]  Fax:  [phone] Name: Harold NIJJARMRN: [id number] of Birth: 711-Oct-1952

## 2021-04-25 ENCOUNTER — Encounter: Payer: Self-pay | Admitting: Rehabilitative and Restorative Service Providers"

## 2021-04-25 ENCOUNTER — Ambulatory Visit: Payer: PPO | Admitting: Rehabilitative and Restorative Service Providers"

## 2021-04-25 ENCOUNTER — Other Ambulatory Visit: Payer: Self-pay

## 2021-04-25 DIAGNOSIS — R262 Difficulty in walking, not elsewhere classified: Secondary | ICD-10-CM

## 2021-04-25 DIAGNOSIS — M6281 Muscle weakness (generalized): Secondary | ICD-10-CM

## 2021-04-25 DIAGNOSIS — M25552 Pain in left hip: Secondary | ICD-10-CM | POA: Diagnosis not present

## 2021-04-25 DIAGNOSIS — M25652 Stiffness of left hip, not elsewhere classified: Secondary | ICD-10-CM

## 2021-04-25 NOTE — Therapy (Signed)
[facility] Physical Therapy [address] [place], [place], [address] Phone: [phone]   Fax:  [phone]  Physical Therapy Treatment  Patient Details  Name: Harold Day MRN: [id number] Date of Birth: 09-Oct-1951 Referring Provider (PT): Aundra Dubin   Encounter Date: 04/25/2021   PT End of Session - 04/25/21 1607     Visit Number 6    Number of Visits 8    Date for PT Re-Evaluation 05/08/21    PT Start Time 5625    PT Stop Time 6389    PT Time Calculation (min) 40 min    Activity Tolerance Patient tolerated treatment well;No increased pain;Patient limited by fatigue    Behavior During Therapy [facility] for tasks assessed/performed             Past Medical History:  Diagnosis Date   Alcoholic ([place])    Allergic rhinitis    Depression    Former smoker    quit 2011   GERD (gastroesophageal reflux disease)    Hepatitis C    noticed when he tried to give blood; never had illness    Past Surgical History:  Procedure Laterality Date   24 HOUR [place] STUDY N/A 09/18/2014   Procedure: 24 HOUR [place] STUDY;  Surgeon: Beryle Beams, MD;  [facility]: [facility];  Service: Endoscopy;  Laterality: N/A;   COLONOSCOPY     ESOPHAGEAL MANOMETRY N/A 09/18/2014   Procedure: ESOPHAGEAL MANOMETRY (EM);  Surgeon: Beryle Beams, MD;  [facility];  Service: Endoscopy;  Laterality: N/A;   SHOULDER ARTHROSCOPY Left 04/2017   VASECTOMY      There were no vitals filed for this visit.   Subjective Assessment - 04/25/21 1544     Subjective Harold Day reports walking endurance is now 3X what it was (90 feet vs 30 feet).    Limitations Walking    How long can you walk comfortably? 90-100 feet, was "About 30 feet"    Diagnostic tests X-Ray    Patient Stated Goals Be able to walk better for 2-3 mile/day exercise walks and golf    Currently in Pain? Yes    Pain Score 3     Pain Location Hip    Pain Orientation Left    Pain Descriptors / Indicators Aching    Pain  Type Chronic pain    Pain Radiating Towards NA    Pain Onset More than a month ago    Pain Frequency Intermittent    Aggravating Factors  WB, particularly walking    Pain Relieving Factors Rest    Effect of Pain on Daily Activities Limits standing and particularly walking endurance    Multiple Pain Sites No                               [facility] Adult PT Treatment/Exercise - 04/25/21 0001       Therapeutic Activites    Other Therapeutic Activities Step-up and over with slow concentrics, eccentrics and hips level 6 and 8 inch steps      Neuro Re-ed    Neuro Re-ed Details  Tandem eyes open 5X 30 seconds      Exercises   Exercises Knee/Hip      Knee/Hip Exercises: Stretches   Hip Flexor Stretch Both;4 reps;20 seconds    ITB Stretch Both;4 reps;20 seconds;Limitations    ITB Stretch Limitations Hamstrings stretch with adduction and IR (no belt)    Piriformis Stretch Both;4  reps;20 seconds;Other (comment)    Piriformis Stretch Limitations Figure 4 and push knee away with same side hand      Knee/Hip Exercises: Machines for Strengthening   Total Gym Leg Press 1 set of 20 L leg only slow concentric and eccentric 56#      Knee/Hip Exercises: Standing   Other Standing Knee Exercises Trunk extension AROM 10X 3 seconds    Other Standing Knee Exercises Hip hike in doorway 2 sets of 10 for 5 seconds & hip abduction with pelvic stabilization 2 sets of 5 for 3 seconds      Knee/Hip Exercises: Sidelying   Hip ABduction Strengthening;Left;2 sets;10 reps;Limitations    Hip ABduction Limitations 1/4 turn from stomach 3 seconds      Knee/Hip Exercises: Prone   Hip Extension Strengthening;Both;2 sets;10 reps;Limitations    Hip Extension Limitations 5 seconds                    PT Education - 04/25/21 1606     Education Details Reviewed home and gym program.  Only needed corrective feedback with side lie hip abduction.    Person(s) Educated Patient    Methods  Explanation;Demonstration;Tactile cues;Verbal cues    Comprehension Verbal cues required;Returned demonstration;Verbalized understanding              PT Short Term Goals - 04/11/21 1620       PT SHORT TERM GOAL #1   Title Harold Day will be independent with his day 1 HEP.    Time 3    Period Weeks    Status Achieved    Target Date 04/03/21               PT Long Term Goals - 04/25/21 1607       PT LONG TERM GOAL #1   Title Harold Day will report L hip pain consistently 0-3/10 on the Numeric Pain Rating Scale.    Baseline Can be 7+/10 with walking    Time 8    Period Weeks    Status On-going      PT LONG TERM GOAL #2   Title Harold Day will improve B hip flexibility for hip flexors to 100 degrees; hip ER to 40 degrees and hamstrings to 45 degrees.    Baseline 90-100; 36-39; 40 (was 80-90; 27; 35-40) respectively    Time 8    Period Weeks    Status Partially Met      PT LONG TERM GOAL #3   Title Harold Day will have improved hip strength as assessed by returning to golf, 1 mile exercise walks without an increase in pain and FOTO at 65.    Baseline 49    Time 8    Period Weeks    Status On-going      PT LONG TERM GOAL #4   Title Harold Day will be independent with his long-term HEP at [place].    Status On-going                   Plan - 04/25/21 1607     Clinical Impression Statement Harold Day notes his walking endurance has "tripled" since starting PT.  Walking endurance is still very limited as compared to what he used to walk for exercise (2-3 miles) and will benefit from continued hip abductors strength work.    Examination-Activity Limitations Locomotion Level    Examination-Participation Restrictions Community Activity    Stability/Clinical Decision Making Stable/Uncomplicated    Rehab Potential Good    PT  Frequency 1x / week    PT Duration 4 weeks    PT Treatment/Interventions ADLs/Self Care Home Management;Iontophoresis 31m/ml Dexamethasone;Cryotherapy;Therapeutic activities;Gait  training;Therapeutic exercise;Neuromuscular re-education;Patient/family education;Manual techniques    PT Next Visit Plan RA and FOTO    PT Home Exercise Plan Access Code: [email or web address]    Consulted and Agree with Plan of Care Patient             Patient will benefit from skilled therapeutic intervention in order to improve the following deficits and impairments:  Abnormal gait, Decreased activity tolerance, Decreased endurance, Decreased range of motion, Decreased strength, Difficulty walking, Impaired flexibility, Pain  Visit Diagnosis: Difficulty walking  Muscle weakness (generalized)  Stiffness of left hip, not elsewhere classified  Pain in left hip     Problem List Patient Active Problem List   Diagnosis Date Noted   Aortic atherosclerosis ([place] 02/25/2021   Tubular adenoma of colon 11/30/2020   Lactose intolerance 11/28/2019   History of gout 11/28/2019   Benign essential tremor 08/20/2017   Seborrheic keratosis 07/17/2016   Recovering alcoholic ([place] 031/09/1623  BPH (benign prostatic hyperplasia) 03/18/2016   Depression 03/18/2016   Hx of colonic polyps 03/08/2012   GERD (gastroesophageal reflux disease) 03/08/2012   History of hepatitis C 03/08/2012   Perennial allergic rhinitis with seasonal variation 03/08/2012    RFarley LyPT, MPT 04/25/2021, 4:10 PM  [facility] [address] [address]Phone: [phone]  Fax:  [phone] Name: Harold BOFFAMRN: [id number] of Birth: [birth year]

## 2021-05-09 ENCOUNTER — Encounter: Payer: Self-pay | Admitting: Rehabilitative and Restorative Service Providers"

## 2021-05-09 ENCOUNTER — Other Ambulatory Visit: Payer: Self-pay

## 2021-05-09 ENCOUNTER — Ambulatory Visit: Payer: PPO | Admitting: Rehabilitative and Restorative Service Providers"

## 2021-05-09 DIAGNOSIS — R262 Difficulty in walking, not elsewhere classified: Secondary | ICD-10-CM | POA: Diagnosis not present

## 2021-05-09 DIAGNOSIS — M6281 Muscle weakness (generalized): Secondary | ICD-10-CM

## 2021-05-09 DIAGNOSIS — M25652 Stiffness of left hip, not elsewhere classified: Secondary | ICD-10-CM

## 2021-05-09 DIAGNOSIS — M25552 Pain in left hip: Secondary | ICD-10-CM

## 2021-05-09 NOTE — Patient Instructions (Signed)
Access Code: [email or web address] URL: [email or web address] Date: 05/09/2021 Prepared by: Vista Mink  Exercises Single Knee to Chest Stretch - 2 x daily - 7 x weekly - 1 sets - 4-5 reps - 20 seconds hold Supine Hamstring Stretch - 2 x daily - 7 x weekly - 1 sets - 4-5 reps - 20 seconds hold Supine Figure 4 Piriformis Stretch - 2 x daily - 7 x weekly - 1 sets - 4-5 reps - 20 seconds hold Standing Hip Hiking - 1 x daily - 3-4 x weekly - 2 sets - 10 reps - 3 seconds hold Standing Lumbar Extension at Wall - Forearms - 5 x daily - 7 x weekly - 1 sets - 5 reps - 3 seconds hold Sidelying Hip Abduction - 1 x daily - 3-4 x weekly - 2-3 sets - 10 reps - 3 seconds hold Prone Alternating Arm and Leg Lifts - 1 x daily - 3-4 x weekly - 2 sets - 10 reps - 3-10 seconds hold

## 2021-05-09 NOTE — Therapy (Signed)
[facility] Physical Therapy [address] [place], [place], [address] Phone: [phone]   Fax:  [phone]  Physical Therapy Treatment/Discharge  Patient Details  Name: Harold Day MRN: [id number] Date of Birth: [birth year] Referring Provider (PT): Dwana Melena L  PHYSICAL THERAPY DISCHARGE SUMMARY  Visits from Start of Care: 7  Current functional level related to goals / functional outcomes: See note/RA   Remaining deficits: Are being addressed with his HEP   Education / Equipment: Updated HEP   Patient agrees to discharge. Patient goals were partially met. Patient is being discharged due to being pleased with the current functional level.  Encounter Date: 05/09/2021   PT End of Session - 05/09/21 1142     Visit Number 7    Number of Visits 8    Date for PT Re-Evaluation 05/08/21    PT Start Time 0804    PT Stop Time 0845    PT Time Calculation (min) 41 min    Activity Tolerance Patient tolerated treatment well;No increased pain;Patient limited by fatigue    Behavior During Therapy [facility] for tasks assessed/performed             Past Medical History:  Diagnosis Date   Alcoholic ([place])    Allergic rhinitis    Depression    Former smoker    quit 2011   GERD (gastroesophageal reflux disease)    Hepatitis C    noticed when he tried to give blood; never had illness    Past Surgical History:  Procedure Laterality Date   24 HOUR [place] STUDY N/A 09/18/2014   Procedure: 24 HOUR [place] STUDY;  Surgeon: Beryle Beams, MD;  [facility];  Service: Endoscopy;  Laterality: N/A;   COLONOSCOPY     ESOPHAGEAL MANOMETRY N/A 09/18/2014   Procedure: ESOPHAGEAL MANOMETRY (EM);  Surgeon: Beryle Beams, MD;  [facility];  Service: Endoscopy;  Laterality: N/A;   SHOULDER ARTHROSCOPY Left 04/2017   VASECTOMY      There were no vitals filed for this visit.   Subjective Assessment - 05/09/21 0837     Subjective Harold Day reports walking  endurance is now 3-4X what it was (>100 feet vs 30 feet).    Limitations Walking    How long can you stand comfortably? Significantly improved vs evaluation and improving weekly    How long can you walk comfortably? 90-100 feet, was "About 30 feet"    Diagnostic tests X-Ray    Patient Stated Goals Be able to walk better for 2-3 mile/day exercise walks and golf    Currently in Pain? Yes    Pain Score 2     Pain Location Hip    Pain Orientation Left    Pain Descriptors / Indicators Aching;Sore    Pain Type Chronic pain    Pain Radiating Towards NA    Pain Onset More than a month ago    Pain Frequency Occasional    Aggravating Factors  Prolonged WB (standing and walking)    Pain Relieving Factors Rest and exercises    Effect of Pain on Daily Activities Limits standing and walking endurance, although this is improving weekly    Multiple Pain Sites No                [facility] PT Assessment - 05/09/21 0001       Observation/Other Assessments   Focus on Therapeutic Outcomes (FOTO)  43 (Goal 65, subjectively much better)      ROM / Strength  AROM / PROM / Strength AROM;Strength      AROM   Overall AROM  Deficits    AROM Assessment Site Hip;Lumbar    Right/Left Hip Left;Right    Right Hip Flexion 95    Right Hip External Rotation  40    Right Hip Internal Rotation  5    Left Hip Flexion 95    Left Hip External Rotation  40    Left Hip Internal Rotation  5      Strength   Overall Strength Deficits    Strength Assessment Site Hip    Right/Left Hip Left;Right    Right Hip Flexion 5/5    Right Hip ABduction 4+/5    Left Hip Flexion 4+/5    Left Hip ABduction 4-/5      Flexibility   Soft Tissue Assessment /Muscle Length yes    Hamstrings 40 degrees/40 degrees                           [facility] Adult PT Treatment/Exercise - 05/09/21 0001       Exercises   Exercises Knee/Hip      Knee/Hip Exercises: Stretches   Hip Flexor Stretch Both;4 reps;20 seconds     ITB Stretch Both;4 reps;20 seconds;Limitations    ITB Stretch Limitations Hamstrings stretch with adduction and IR (no belt)    Piriformis Stretch Both;4 reps;20 seconds;Other (comment)    Piriformis Stretch Limitations Figure 4 and push knee away with same side hand      Knee/Hip Exercises: Machines for Strengthening   Total Gym Leg Press 1 set of 20 L leg only slow concentric and eccentric 56#      Knee/Hip Exercises: Standing   Other Standing Knee Exercises Trunk extension AROM 10X 3 seconds    Other Standing Knee Exercises Hip hike in doorway 2 sets of 10 for 5 seconds & hip abduction with pelvic stabilization 2 sets of 5 for 3 seconds      Knee/Hip Exercises: Sidelying   Hip ABduction Strengthening;Left;2 sets;10 reps;Limitations    Hip ABduction Limitations 1/4 turn from stomach 3 seconds      Knee/Hip Exercises: Prone   Other Prone Exercises Prone alternating arm/opposite leg 2 sets of 10 for 3 seconds                    PT Education - 05/09/21 1140     Education Details Reviewed updated HEP and exam findings.  Made a progression to hip strengthening to also help with mild low back pain.    Person(s) Educated Patient    Methods Explanation;Demonstration;Tactile cues;Verbal cues;Handout    Comprehension Verbal cues required;Returned demonstration;Need further instruction;Tactile cues required;Verbalized understanding              PT Short Term Goals - 04/11/21 1620       PT SHORT TERM GOAL #1   Title Harold Day will be independent with his day 1 HEP.    Time 3    Period Weeks    Status Achieved    Target Date 04/03/21               PT Long Term Goals - 05/09/21 1140       PT LONG TERM GOAL #1   Title Harold Day will report L hip pain consistently 0-3/10 on the Numeric Pain Rating Scale.    Baseline Can increase with walking but endurance is improving.    Time 8  Period Weeks    Status Achieved      PT LONG TERM GOAL #2   Title Harold Day will improve B  hip flexibility for hip flexors to 100 degrees; hip ER to 40 degrees and hamstrings to 45 degrees.    Baseline 95; 40; 40 (was 80-90; 27; 35-40) respectively    Time 8    Period Weeks    Status Partially Met      PT LONG TERM GOAL #3   Title Harold Day will have improved hip strength as assessed by returning to golf, 1 mile exercise walks without an increase in pain and FOTO at 65.    Baseline 43    Time 8    Period Weeks    Status Partially Met      PT LONG TERM GOAL #4   Title Harold Day will be independent with his long-term HEP at [place].    Status Achieved                   Plan - 05/09/21 1142     Clinical Impression Statement Harold Day notes significant progress with his L hip since starting PT.  He can walk longer (3-4X longer), play golf and notices progress weekly with his standing and walking endurance.  FOTO score seems artificially low due to a recent low back strain.  Harold Day should continue to make gains with his independent PT as he has good compliance and is making gains on a weekly basis.  He was given the option of transition into independent PT vs continued supervised care.  He feels comfortable with his current program and progress and chooses to continue PT independently.  He is welcome to return for reassessment or additional supervised visits if requested.    Examination-Activity Limitations Locomotion Level    Examination-Participation Restrictions Community Activity    Stability/Clinical Decision Making Stable/Uncomplicated    Rehab Potential Good    PT Frequency 1x / week    PT Duration Other (comment)   DC   PT Treatment/Interventions ADLs/Self Care Home Management;Iontophoresis 7m/ml Dexamethasone;Cryotherapy;Therapeutic activities;Gait training;Therapeutic exercise;Neuromuscular re-education;Patient/family education;Manual techniques    PT Next Visit Plan RA and FOTO    PT Home Exercise Plan Access Code: [email or web address]    Consulted and Agree with Plan of Care Patient              Patient will benefit from skilled therapeutic intervention in order to improve the following deficits and impairments:  Abnormal gait, Decreased activity tolerance, Decreased endurance, Decreased range of motion, Decreased strength, Difficulty walking, Impaired flexibility, Pain  Visit Diagnosis: Difficulty walking  Muscle weakness (generalized)  Stiffness of left hip, not elsewhere classified  Pain in left hip     Problem List Patient Active Problem List   Diagnosis Date Noted   Aortic atherosclerosis ([place] 02/25/2021   Tubular adenoma of colon 11/30/2020   Lactose intolerance 11/28/2019   History of gout 11/28/2019   Benign essential tremor 08/20/2017   Seborrheic keratosis 07/17/2016   Recovering alcoholic ([place] 004/88/8916  BPH (benign prostatic hyperplasia) 03/18/2016   Depression 03/18/2016   Hx of colonic polyps 03/08/2012   GERD (gastroesophageal reflux disease) 03/08/2012   History of hepatitis C 03/08/2012   Perennial allergic rhinitis with seasonal variation 03/08/2012    RFarley LyPT, MPT 05/09/2021, 11:46 AM  [facility] [address] [place] [address]Phone: [phone]  Fax:  [phone] Name: Harold GARTMANMRN: [id number] of Birth: 711/19/1952

## 2021-05-15 ENCOUNTER — Ambulatory Visit: Payer: PPO | Admitting: Orthopaedic Surgery

## 2021-05-15 DIAGNOSIS — M545 Low back pain, unspecified: Secondary | ICD-10-CM

## 2021-05-15 DIAGNOSIS — M25552 Pain in left hip: Secondary | ICD-10-CM

## 2021-05-15 NOTE — Progress Notes (Signed)
Office Visit Note   Patient: Harold Day           Date of Birth: Apr 20, 1951           MRN: [id number] Visit Date: 05/15/2021              Requested by: Denita Lung, MD [address] [place],  [place] [address] PCP: Denita Lung, MD   Assessment & Plan: Visit Diagnoses:  1. Pain in left hip     Plan: Impression is lumbar radiculopathy causing the left hip pain.  His symptoms point more towards his lumbar spine as a source of his problems and therefore given failure of conservative treatments we will need to order an MRI of the lumbar spine.  Follow-up after the MRI.  Follow-Up Instructions: No follow-ups on file.   Orders:  No orders of the defined types were placed in this encounter.  No orders of the defined types were placed in this encounter.     Procedures: No procedures performed   Clinical Data: No additional findings.   Subjective: Chief Complaint  Patient presents with   Left Hip - Pain    Harold Day returns today for follow-up of continued left hip pain with back pain.  He describes numbness and tingling and burning that runs down the back of his leg and his foot falls asleep at times.  He has completed physical therapy and he is undergone a couple of cortisone injections.  He states that he is only slightly better.   Review of Systems   Objective: Vital Signs: There were no vitals taken for this visit.  Physical Exam  Ortho Exam Examination of the left hip is relatively benign.  He has some tenderness to the posterior lateral region of the greater trochanter.  He has no pain with passive stretch of the abductors and no pain with resisted active hip abduction.  Negative sciatic tension signs.  Lumbar spine is slightly tender to palpation. Specialty Comments:  No specialty comments available.  Imaging: No results found.   PMFS History: Patient Active Problem List   Diagnosis Date Noted   Aortic atherosclerosis ([place]) 02/25/2021    Tubular adenoma of colon 11/30/2020   Lactose intolerance 11/28/2019   History of gout 11/28/2019   Benign essential tremor 08/20/2017   Seborrheic keratosis 07/17/2016   Recovering alcoholic ([place]) 0000000   BPH (benign prostatic hyperplasia) 03/18/2016   Depression 03/18/2016   Hx of colonic polyps 03/08/2012   GERD (gastroesophageal reflux disease) 03/08/2012   History of hepatitis C 03/08/2012   Perennial allergic rhinitis with seasonal variation 03/08/2012   Past Medical History:  Diagnosis Date   Alcoholic ([place])    Allergic rhinitis    Depression    Former smoker    quit 2011   GERD (gastroesophageal reflux disease)    Hepatitis C    noticed when he tried to give blood; never had illness    Family History  Problem Relation Age of Onset   Lung cancer Mother    Autism Son     Past Surgical History:  Procedure Laterality Date   24 HOUR [place] STUDY N/A 09/18/2014   Procedure: 24 HOUR [place] STUDY;  Surgeon: Beryle Beams, MD;  [facility];  Service: Endoscopy;  Laterality: N/A;   COLONOSCOPY     ESOPHAGEAL MANOMETRY N/A 09/18/2014   Procedure: ESOPHAGEAL MANOMETRY (EM);  Surgeon: Beryle Beams, MD;  [facility];  Service: Endoscopy;  Laterality: N/A;  SHOULDER ARTHROSCOPY Left 04/2017   VASECTOMY     Social History   Occupational History   Occupation: retired Ecologist)    Employer: [employer]  Tobacco Use   Smoking status: Former   Smokeless tobacco: Current    Types: Snuff  Substance and Sexual Activity   Alcohol use: No    Comment: no alcohol since about 2000   Drug use: No   Sexual activity: Yes

## 2021-05-15 NOTE — Addendum Note (Signed)
Addended by: Precious Bard on: 05/15/2021 12:42 PM   Modules accepted: Orders

## 2021-05-18 ENCOUNTER — Ambulatory Visit
Admission: RE | Admit: 2021-05-18 | Discharge: 2021-05-18 | Disposition: A | Discharge status: Home / Self Care | Payer: PPO | Source: Ambulatory Visit | Attending: Orthopaedic Surgery | Admitting: Orthopaedic Surgery

## 2021-05-18 DIAGNOSIS — M545 Low back pain, unspecified: Secondary | ICD-10-CM

## 2021-05-28 ENCOUNTER — Other Ambulatory Visit: Payer: Self-pay

## 2021-05-28 ENCOUNTER — Encounter: Payer: Self-pay | Admitting: Orthopaedic Surgery

## 2021-05-28 ENCOUNTER — Ambulatory Visit: Payer: PPO | Admitting: Orthopaedic Surgery

## 2021-05-28 VITALS — Ht 68.5 in | Wt 164.0 lb

## 2021-05-28 DIAGNOSIS — M25552 Pain in left hip: Secondary | ICD-10-CM | POA: Diagnosis not present

## 2021-05-28 DIAGNOSIS — M545 Low back pain, unspecified: Secondary | ICD-10-CM | POA: Diagnosis not present

## 2021-05-28 NOTE — Progress Notes (Signed)
Office Visit Note   Patient: Harold Day           Date of Birth: [birth year]           MRN: [id number] Visit Date: 05/28/2021              Requested by: Denita Lung, MD [address] [place],  [place] [address] PCP: Denita Lung, MD   Assessment & Plan: Visit Diagnoses:  1. 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  I believe the patient's symptoms are likely coming from his back based on clinical exam and his response to previous interventions.  At this point, we have discussed referral to Dr. Ernestina Patches for epidural steroid injection as well as referral to Dr. Lorin Mercy or Louanne Skye for further evaluation and treatment recommendation.  He will follow-up with [place] as needed.  Follow-Up Instructions: Return for follow up with [facility] for ESI.   Orders:  No orders of the defined types were placed in this encounter.  No orders of the defined types were placed in this encounter.     Procedures: No procedures performed   Clinical Data: No additional findings.   Subjective: Chief Complaint  Patient presents with   Lower Back - Follow-up    MRI review    HPI patient is a pleasant 70 year old gentleman who comes in today to discuss MRI results of the lumbar spine.  He has been dealing with chronic left lower back and left hip pain with radiation down the back of the leg for several months.  He is also been complaining of paresthesias to left lower extremity.  He has had injections to the trochanteric bursa without significant relief.  He has also had 8 weeks of physical therapy as well as a course of steroids without relief.  Subsequent MRI of the lumbar spine ordered which showed multilevel disc bulges and mild spinal canal stenosis.  Review of Systems as detailed in HPI.  All others reviewed and are negative.   Objective: Vital Signs: Ht 5' 8.5" (1.74 m)   Wt 164  lb (74.4 kg)   BMI 24.57 kg/m   Physical Exam well-developed and well-nourished gentleman in no acute distress.  Alert and oriented x3.  Ortho Exam negative straight leg raise.  Negative logroll negative FADIR.  No focal weakness.  He is neurovascular intact distally.  Specialty Comments:  No specialty comments available.  Imaging: No new imaging   PMFS History: Patient Active Problem List   Diagnosis Date Noted   Aortic atherosclerosis ([place]) 02/25/2021   Tubular adenoma of colon 11/30/2020   Lactose intolerance 11/28/2019   History of gout 11/28/2019   Benign essential tremor 08/20/2017   Seborrheic keratosis 07/17/2016   Recovering alcoholic ([place]) 0000000   BPH (benign prostatic hyperplasia) 03/18/2016   Depression 03/18/2016   Hx of colonic polyps 03/08/2012   GERD (gastroesophageal reflux disease) 03/08/2012   History of hepatitis C 03/08/2012   Perennial allergic rhinitis with seasonal variation 03/08/2012   Past Medical History:  Diagnosis Date   Alcoholic ([place])    Allergic rhinitis    Depression    Former smoker    quit 2011   GERD (gastroesophageal reflux disease)    Hepatitis C    noticed when he tried to give blood; never had illness    Family History  Problem Relation Age of Onset  Lung cancer Mother    Autism Son     Past Surgical History:  Procedure Laterality Date   79 HOUR [place] STUDY N/A 09/18/2014   Procedure: 24 HOUR [place] STUDY;  Surgeon: Beryle Beams, MD;  Location: [facility];  Service: Endoscopy;  Laterality: N/A;   COLONOSCOPY     ESOPHAGEAL MANOMETRY N/A 09/18/2014   Procedure: ESOPHAGEAL MANOMETRY (EM);  Surgeon: Beryle Beams, MD;  Location: [facility];  Service: Endoscopy;  Laterality: N/A;   SHOULDER ARTHROSCOPY Left 04/2017   VASECTOMY     Social History   Occupational History   Occupation: retired (Counsellor)    Employer: [employer]  Tobacco Use   Smoking status: Former   Smokeless tobacco: Current     Types: Snuff  Substance and Sexual Activity   Alcohol use: No    Comment: no alcohol since about 2000   Drug use: No   Sexual activity: Yes

## 2021-06-13 ENCOUNTER — Ambulatory Visit (INDEPENDENT_AMBULATORY_CARE_PROVIDER_SITE_OTHER): Payer: PPO | Admitting: Physical Medicine and Rehabilitation

## 2021-06-13 ENCOUNTER — Ambulatory Visit: Payer: Self-pay

## 2021-06-13 ENCOUNTER — Other Ambulatory Visit: Payer: Self-pay

## 2021-06-13 ENCOUNTER — Encounter: Payer: Self-pay | Admitting: Physical Medicine and Rehabilitation

## 2021-06-13 VITALS — BP 143/84 | HR 80

## 2021-06-13 DIAGNOSIS — M5416 Radiculopathy, lumbar region: Secondary | ICD-10-CM

## 2021-06-13 MED ORDER — BETAMETHASONE SOD PHOS & ACET 6 (3-3) MG/ML IJ SUSP
12.0000 mg | Freq: Once | INTRAMUSCULAR | Status: AC
  Administered 2021-06-13: 12 mg

## 2021-06-13 NOTE — Progress Notes (Signed)
  Left extraforam L3-4 and L4-5, Pt state lower back pain that travels to his left hip and leg. Pt state walking and laying down  makes the pain worse. Pt state he takes over the counter pain meds and uses ice to help ease his pain.  Numeric Pain Rating Scale and Functional Assessment Average Pain 2   In the last MONTH (on 0-10 scale) has pain interfered with the following?  1. General activity like being  able to carry out your everyday physical activities such as walking, climbing stairs, carrying groceries, or moving a chair?  Rating(8)   +Driver, -BT, -Dye Allergies.

## 2021-06-13 NOTE — Patient Instructions (Signed)

## 2021-06-18 ENCOUNTER — Ambulatory Visit: Payer: PPO | Admitting: Orthopaedic Surgery

## 2021-06-25 ENCOUNTER — Telehealth: Payer: Self-pay | Admitting: Physical Medicine and Rehabilitation

## 2021-06-25 DIAGNOSIS — M545 Low back pain, unspecified: Secondary | ICD-10-CM

## 2021-06-25 NOTE — Telephone Encounter (Signed)
Left L4-5 IL on 8/25. Patient reports about 20% relief for a few days following injection. Please advise.

## 2021-06-25 NOTE — Telephone Encounter (Signed)
Pt called stating he got an inj on 06/13/21 and was told if it wasn't better in a couple weeks CB so we could make an appt for a 2nd inj. Pt would like to have the 2nd inj setup please.   [phone]

## 2021-06-26 NOTE — Telephone Encounter (Signed)
Referral placed for documentation of authorization. Scheduled for 9/15 at 1030 with driver.

## 2021-06-27 ENCOUNTER — Telehealth: Payer: Self-pay | Admitting: Orthopaedic Surgery

## 2021-06-27 NOTE — Progress Notes (Signed)
Harold Day - 70 y.o. male MRN [id number]  Date of birth: 12-31-1950  Office Visit Note: Visit Date: 06/13/2021 PCP: Denita Lung, MD Referred by: Denita Lung, MD  Subjective: Chief Complaint  Patient presents with   Lower Back - Pain   Left Hip - Pain   Left Leg - Pain   HPI:  Harold Day is a 70 y.o. male who comes in today at the request of Dr. Eduard Roux for planned Left L4-L5 Lumbar Interlaminar epidural steroid injection with fluoroscopic guidance.  The patient has failed conservative care including home exercise, medications, time and activity modification.  This injection will be diagnostic and hopefully therapeutic.  Please see requesting physician notes for further details and justification. MRI reviewed with images and spine model.  MRI reviewed in the note below.  When I viewed the MRI I do feel like there is extraforaminal small protrusion on the left at L3-4 and L4-5.  Depending on relief with the interlaminar epidural would consider transforaminal approach.    ROS Otherwise per HPI.  Assessment & Plan: Visit Diagnoses:    ICD-10-CM   1. Lumbar radiculopathy  M54.16 XR C-ARM NO REPORT    Epidural Steroid injection    betamethasone acetate-betamethasone sodium phosphate (CELESTONE) injection 12 mg      Plan: No additional findings.   Meds & Orders:  Meds ordered this encounter  Medications   betamethasone acetate-betamethasone sodium phosphate (CELESTONE) injection 12 mg    Orders Placed This Encounter  Procedures   XR C-ARM NO REPORT   Epidural Steroid injection    Follow-up: Return if symptoms worsen or fail to improve.   Procedures: No procedures performed  Lumbar Epidural Steroid Injection - Interlaminar Approach with Fluoroscopic Guidance  Patient: Harold Day      Date of Birth: 1951-02-08 MRN: [id number] PCP: Denita Lung, MD      Visit Date: 06/13/2021   Universal Protocol:     Consent Given By: the patient  Position:  PRONE  Additional Comments: Vital signs were monitored before and after the procedure. Patient was prepped and draped in the usual sterile fashion. The correct patient, procedure, and site was verified.   Injection Procedure Details:   Procedure diagnoses: Lumbar radiculopathy [M54.16]   Meds Administered:  Meds ordered this encounter  Medications   betamethasone acetate-betamethasone sodium phosphate (CELESTONE) injection 12 mg     Laterality: Left  Location/Site:  L4-L5  Needle: 3.5 in., 20 ga. Tuohy  Needle Placement: Paramedian epidural  Findings:   -Comments: Excellent flow of contrast into the epidural space.  Procedure Details: Using a paramedian approach from the side mentioned above, the region overlying the inferior lamina was localized under fluoroscopic visualization and the soft tissues overlying this structure were infiltrated with 4 ml. of 1% Lidocaine without Epinephrine. The Tuohy needle was inserted into the epidural space using a paramedian approach.   The epidural space was localized using loss of resistance along with counter oblique bi-planar fluoroscopic views.  After negative aspirate for air, blood, and CSF, a 2 ml. volume of Isovue-250 was injected into the epidural space and the flow of contrast was observed. Radiographs were obtained for documentation purposes.    The injectate was administered into the level noted above.   Additional Comments:  The patient tolerated the procedure well Dressing: 2 x 2 sterile gauze and Band-Aid    Post-procedure details: Patient was observed during the procedure. Post-procedure instructions were reviewed.  Patient left  the clinic in stable condition.    Clinical History: MRI LUMBAR SPINE WITHOUT CONTRAST   TECHNIQUE: Multiplanar, multisequence MR imaging of the lumbar spine was performed. No intravenous contrast was administered.   COMPARISON:  Lumbar spine radiographs 02/22/2021.   FINDINGS: Mild  intermittent motion degradation.   Segmentation: 5 lumbar vertebrae. The caudal most well-formed intervertebral disc space is designated L5-S1.   Alignment:  Trace T12-L1 and L1-L2 grade 1 retrolisthesis.   Vertebrae: Vertebral body height is maintained. No significant marrow edema or focal suspicious osseous lesion.   Conus medullaris and cauda equina: Conus extends to the L2 level. No signal abnormality within the visualized distal spinal cord. 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   Paraspinal and other soft tissues: Left renal cyst. Paraspinal soft tissues unremarkable.   Disc levels:   Mild multilevel disc degeneration, greatest at T12-L1.   T11-T12: Imaged sagittally. Tiny right center disc protrusion. No significant spinal canal or foraminal stenosis.   T12-L1: Trace grade 1 retrolisthesis. Disc bulge. Superimposed small right center/subarticular disc protrusion. Minimal partial effacement of the ventral thecal sac without spinal cord mass effect. No significant foraminal stenosis.   L1-L2: Trace grade 1 retrolisthesis. Disc bulge. Minimal partial effacement of the ventral thecal sac without spinal cord or nerve root impingement. No significant foraminal stenosis.   L2-L3: Mild facet arthrosis/ligamentum flavum hypertrophy. No significant disc herniation or stenosis.   L3-L4: Small disc bulge. Mild facet arthrosis/ligamentum flavum hypertrophy. Mild bilateral subarticular narrowing without nerve root impingement. Central canal patent. No significant foraminal stenosis.   L4-L5: No significant disc herniation. Mild facet arthrosis/ligamentum flavum hypertrophy. Mild bilateral subarticular narrowing without nerve root impingement. Central canal patent. No significant foraminal stenosis.   L5-S1: No significant disc herniation. Mild  facet arthrosis (greater on the right). No significant spinal canal or foraminal stenosis.   IMPRESSION: Lumbar spondylosis, as outlined. No more than mild spinal canal stenosis. No significant neural foraminal narrowing.   Mild multilevel disc degeneration, greatest at T12-L1.   Trace T12-L1 and L1-L2 grade 1 retrolisthesis.     Electronically Signed   By: Kellie Simmering DO   On: 05/19/2021 12:07     Objective:  VS:  HT:    WT:   BMI:     BP:(!) 143/84  HR:80bpm  TEMP: ( )  RESP:  Physical Exam Vitals and nursing note reviewed.  Constitutional:      General: He is not in acute distress.    Appearance: Normal appearance. He is not ill-appearing.  HENT:     Head: Normocephalic and atraumatic.     Right Ear: External ear normal.     Left Ear: External ear normal.     Nose: No congestion.  Eyes:     Extraocular Movements: Extraocular movements intact.  Cardiovascular:     Rate and Rhythm: Normal rate.     Pulses: Normal pulses.  Pulmonary:     Effort: Pulmonary effort is normal. No respiratory distress.  Abdominal:     General: There is no distension.     Palpations: Abdomen is soft.  Musculoskeletal:        General: No tenderness or signs of injury.     Cervical back: Neck supple.     Right lower leg: No edema.     Left lower leg: No edema.     Comments: Patient has good distal strength without  clonus.  Skin:    Findings: No erythema or rash.  Neurological:     General: No focal deficit present.     Mental Status: He is alert and oriented to person, place, and time.     Sensory: No sensory deficit.     Motor: No weakness or abnormal muscle tone.     Coordination: Coordination normal.  Psychiatric:        Mood and Affect: Mood normal.        Behavior: Behavior normal.     Imaging: No results found.

## 2021-06-27 NOTE — Procedures (Signed)
Lumbar Epidural Steroid Injection - Interlaminar Approach with Fluoroscopic Guidance  Patient: Harold Day      Date of Birth: 1951-04-18 MRN: [id number] PCP: Denita Lung, MD      Visit Date: 06/13/2021   Universal Protocol:     Consent Given By: the patient  Position: PRONE  Additional Comments: Vital signs were monitored before and after the procedure. Patient was prepped and draped in the usual sterile fashion. The correct patient, procedure, and site was verified.   Injection Procedure Details:   Procedure diagnoses: Lumbar radiculopathy [M54.16]   Meds Administered:  Meds ordered this encounter  Medications   betamethasone acetate-betamethasone sodium phosphate (CELESTONE) injection 12 mg     Laterality: Left  Location/Site:  L4-L5  Needle: 3.5 in., 20 ga. Tuohy  Needle Placement: Paramedian epidural  Findings:   -Comments: Excellent flow of contrast into the epidural space.  Procedure Details: Using a paramedian approach from the side mentioned above, the region overlying the inferior lamina was localized under fluoroscopic visualization and the soft tissues overlying this structure were infiltrated with 4 ml. of 1% Lidocaine without Epinephrine. The Tuohy needle was inserted into the epidural space using a paramedian approach.   The epidural space was localized using loss of resistance along with counter oblique bi-planar fluoroscopic views.  After negative aspirate for air, blood, and CSF, a 2 ml. volume of Isovue-250 was injected into the epidural space and the flow of contrast was observed. Radiographs were obtained for documentation purposes.    The injectate was administered into the level noted above.   Additional Comments:  The patient tolerated the procedure well Dressing: 2 x 2 sterile gauze and Band-Aid    Post-procedure details: Patient was observed during the procedure. Post-procedure instructions were reviewed.  Patient left the  clinic in stable condition.

## 2021-06-27 NOTE — Telephone Encounter (Signed)
Pt called and was wondering if he could get some pain medication called in until he could get in to see [facility]?   CB [phone]

## 2021-07-01 ENCOUNTER — Other Ambulatory Visit: Payer: Self-pay | Admitting: Physician Assistant

## 2021-07-01 MED ORDER — TRAMADOL HCL 50 MG PO TABS: 50.0000 mg | ORAL_TABLET | Freq: Three times a day (TID) | ORAL | 0 refills | Status: AC | PRN

## 2021-07-01 NOTE — Telephone Encounter (Signed)
Patient aware.

## 2021-07-01 NOTE — Telephone Encounter (Signed)
Sent in tramadol

## 2021-07-04 ENCOUNTER — Other Ambulatory Visit: Payer: Self-pay

## 2021-07-04 ENCOUNTER — Encounter: Payer: Self-pay | Admitting: Physical Medicine and Rehabilitation

## 2021-07-04 ENCOUNTER — Ambulatory Visit (INDEPENDENT_AMBULATORY_CARE_PROVIDER_SITE_OTHER): Payer: PPO | Admitting: Physical Medicine and Rehabilitation

## 2021-07-04 ENCOUNTER — Ambulatory Visit: Payer: Self-pay

## 2021-07-04 VITALS — BP 119/63 | HR 80

## 2021-07-04 DIAGNOSIS — M5416 Radiculopathy, lumbar region: Secondary | ICD-10-CM | POA: Diagnosis not present

## 2021-07-04 DIAGNOSIS — M5116 Intervertebral disc disorders with radiculopathy, lumbar region: Secondary | ICD-10-CM

## 2021-07-04 MED ORDER — METHYLPREDNISOLONE ACETATE 80 MG/ML IJ SUSP
80.0000 mg | Freq: Once | INTRAMUSCULAR | Status: AC
  Administered 2021-07-04: 80 mg

## 2021-07-04 NOTE — Progress Notes (Signed)
Pt state lower back pain that travels to his left thigh. Pt state walking and laying down makes the pain worse. Pt state he takes pain meds to help ease his pain. Pt has hx of inj on 06/13/21 pt state it didn't help.  Numeric Pain Rating Scale and Functional Assessment Average Pain 5   In the last MONTH (on 0-10 scale) has pain interfered with the following?  1. General activity like being  able to carry out your everyday physical activities such as walking, climbing stairs, carrying groceries, or moving a chair?  Rating(8)   +Driver, -BT, -Dye Allergies.

## 2021-07-04 NOTE — Patient Instructions (Signed)

## 2021-07-07 NOTE — Procedures (Signed)
Lumbosacral Transforaminal Epidural Steroid Injection - Sub-Pedicular Approach with Fluoroscopic Guidance  Patient: Harold Day      Date of Birth: Mar 12, 1951 MRN: [id number] PCP: Denita Lung, MD      Visit Date: 07/04/2021   Universal Protocol:    Date/Time: 07/04/2021  Consent Given By: the patient  Position: PRONE  Additional Comments: Vital signs were monitored before and after the procedure. Patient was prepped and draped in the usual sterile fashion. The correct patient, procedure, and site was verified.   Injection Procedure Details:   Procedure diagnoses: Lumbar radiculopathy [M54.16]    Meds Administered:  Meds ordered this encounter  Medications   methylPREDNISolone acetate (DEPO-MEDROL) injection 80 mg    Laterality: Left  Location/Site: L3 and L4  Needle:5.0 in., 22 ga.  Short bevel or Quincke spinal needle  Needle Placement: Transforaminal  Findings:    -Comments: Excellent flow of contrast along the nerve, nerve root and into the epidural space.  Procedure Details: After squaring off the end-plates to get a true AP view, the C-arm was positioned so that an oblique view of the foramen as noted above was visualized. The target area is just inferior to the "nose of the scotty dog" or sub pedicular. The soft tissues overlying this structure were infiltrated with 2-3 ml. of 1% Lidocaine without Epinephrine.  The spinal needle was inserted toward the target using a "trajectory" view along the fluoroscope beam.  Under AP and lateral visualization, the needle was advanced so it did not puncture dura and was located close the 6 O'Clock position of the pedical in AP tracterory. Biplanar projections were used to confirm position. Aspiration was confirmed to be negative for CSF and/or blood. A 1-2 ml. volume of Isovue-250 was injected and flow of contrast was noted at each level. Radiographs were obtained for documentation purposes.   After attaining the  desired flow of contrast documented above, a 0.5 to 1.0 ml test dose of 0.25% Marcaine was injected into each respective transforaminal space.  The patient was observed for 90 seconds post injection.  After no sensory deficits were reported, and normal lower extremity motor function was noted,   the above injectate was administered so that equal amounts of the injectate were placed at each foramen (level) into the transforaminal epidural space.   Additional Comments:  The patient tolerated the procedure well Dressing: 2 x 2 sterile gauze and Band-Aid    Post-procedure details: Patient was observed during the procedure. Post-procedure instructions were reviewed.  Patient left the clinic in stable condition.

## 2021-07-07 NOTE — Progress Notes (Signed)
[name] - 70 y.o. male MRN [id number]  Date of birth: [birth year]  Office Visit Note: Visit Date: 07/04/2021 PCP: Denita Lung, MD Referred by: Denita Lung, MD  Subjective: Chief Complaint  Patient presents with   Lower Back - Pain   Left Thigh - Pain   HPI:  Harold Day is a 70 y.o. male who comes in today at the request of Dr. Eduard Roux for planned Left L3-4 and L4-5 Lumbar Transforaminal epidural steroid injection with fluoroscopic guidance.  The patient has failed conservative care including home exercise, medications, time and activity modification.  This injection will be diagnostic and hopefully therapeutic.  Please see requesting physician notes for further details and justification. MRI reviewed with images and spine model.  MRI reviewed in the note below.  Interlaminar epidural steroid injection at L4-5 did not seem to help at all.  I did review flow contrast and there is good flow contrast through that area with good delivery of medication.  He does have foraminal to extraforaminal protrusions at 2 levels that could correspond to his hip and thigh pain.  We will try a transforaminal approach.  If those injections are good in terms of flow of contrast then I would probably have him return to see Dr. Eduard Roux probably look at this is not a lumbar spine issue.  Conversely if it was still felt to be that and the injections were just not beneficial he obviously could seek opinion from a spine surgeon or neurosurgeon.  There is no specific nerve compression.     ROS Otherwise per HPI.  Assessment & Plan: Visit Diagnoses:    ICD-10-CM   1. Lumbar radiculopathy  M54.16 XR C-ARM NO REPORT    Epidural Steroid injection    methylPREDNISolone acetate (DEPO-MEDROL) injection 80 mg    2. Radiculopathy due to lumbar intervertebral disc disorder  M51.16       Plan: No additional findings.   Meds & Orders:  Meds ordered this encounter  Medications    methylPREDNISolone acetate (DEPO-MEDROL) injection 80 mg    Orders Placed This Encounter  Procedures   XR C-ARM NO REPORT   Epidural Steroid injection    Follow-up: Return if symptoms worsen or fail to improve.   Procedures: No procedures performed  Lumbosacral Transforaminal Epidural Steroid Injection - Sub-Pedicular Approach with Fluoroscopic Guidance  Patient: Harold Day      Date of Birth: May 12, 1951 MRN: [id number] PCP: Denita Lung, MD      Visit Date: 07/04/2021   Universal Protocol:    Date/Time: 07/04/2021  Consent Given By: the patient  Position: PRONE  Additional Comments: Vital signs were monitored before and after the procedure. Patient was prepped and draped in the usual sterile fashion. The correct patient, procedure, and site was verified.   Injection Procedure Details:   Procedure diagnoses: Lumbar radiculopathy [M54.16]    Meds Administered:  Meds ordered this encounter  Medications   methylPREDNISolone acetate (DEPO-MEDROL) injection 80 mg    Laterality: Left  Location/Site: L3 and L4  Needle:5.0 in., 22 ga.  Short bevel or Quincke spinal needle  Needle Placement: Transforaminal  Findings:    -Comments: Excellent flow of contrast along the nerve, nerve root and into the epidural space.  Procedure Details: After squaring off the end-plates to get a true AP view, the C-arm was positioned so that an oblique view of the foramen as noted above was visualized. The target area is just inferior to  the "nose of the scotty dog" or sub pedicular. The soft tissues overlying this structure were infiltrated with 2-3 ml. of 1% Lidocaine without Epinephrine.  The spinal needle was inserted toward the target using a "trajectory" view along the fluoroscope beam.  Under AP and lateral visualization, the needle was advanced so it did not puncture dura and was located close the 6 O'Clock position of the pedical in AP tracterory. Biplanar projections were  used to confirm position. Aspiration was confirmed to be negative for CSF and/or blood. A 1-2 ml. volume of Isovue-250 was injected and flow of contrast was noted at each level. Radiographs were obtained for documentation purposes.   After attaining the desired flow of contrast documented above, a 0.5 to 1.0 ml test dose of 0.25% Marcaine was injected into each respective transforaminal space.  The patient was observed for 90 seconds post injection.  After no sensory deficits were reported, and normal lower extremity motor function was noted,   the above injectate was administered so that equal amounts of the injectate were placed at each foramen (level) into the transforaminal epidural space.   Additional Comments:  The patient tolerated the procedure well Dressing: 2 x 2 sterile gauze and Band-Aid    Post-procedure details: Patient was observed during the procedure. Post-procedure instructions were reviewed.  Patient left the clinic in stable condition.    Clinical History: MRI LUMBAR SPINE WITHOUT CONTRAST   TECHNIQUE: Multiplanar, multisequence MR imaging of the lumbar spine was performed. No intravenous contrast was administered.   COMPARISON:  Lumbar spine radiographs 02/22/2021.   FINDINGS: Mild intermittent motion degradation.   Segmentation: 5 lumbar vertebrae. The caudal most well-formed intervertebral disc space is designated L5-S1.   Alignment:  Trace T12-L1 and L1-L2 grade 1 retrolisthesis.   Vertebrae: Vertebral body height is maintained. No significant marrow edema or focal suspicious osseous lesion.   Conus medullaris and cauda equina: Conus extends to the L2 level. No signal abnormality within the visualized distal spinal cord. 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   Paraspinal and other soft tissues: Left renal cyst. Paraspinal  soft tissues unremarkable.   Disc levels:   Mild multilevel disc degeneration, greatest at T12-L1.   T11-T12: Imaged sagittally. Tiny right center disc protrusion. No significant spinal canal or foraminal stenosis.   T12-L1: Trace grade 1 retrolisthesis. Disc bulge. Superimposed small right center/subarticular disc protrusion. Minimal partial effacement of the ventral thecal sac without spinal cord mass effect. No significant foraminal stenosis.   L1-L2: Trace grade 1 retrolisthesis. Disc bulge. Minimal partial effacement of the ventral thecal sac without spinal cord or nerve root impingement. No significant foraminal stenosis.   L2-L3: Mild facet arthrosis/ligamentum flavum hypertrophy. No significant disc herniation or stenosis.   L3-L4: Small disc bulge. Mild facet arthrosis/ligamentum flavum hypertrophy. Mild bilateral subarticular narrowing without nerve root impingement. Central canal patent. No significant foraminal stenosis.   L4-L5: No significant disc herniation. Mild facet arthrosis/ligamentum flavum hypertrophy. Mild bilateral subarticular narrowing without nerve root impingement. Central canal patent. No significant foraminal stenosis.   L5-S1: No significant disc herniation. Mild facet arthrosis (greater on the right). No significant spinal canal or foraminal stenosis.   IMPRESSION: Lumbar spondylosis, as outlined. No more than mild spinal canal stenosis. No significant neural foraminal narrowing.   Mild multilevel disc degeneration, greatest at T12-L1.   Trace T12-L1 and L1-L2 grade 1 retrolisthesis.  Electronically Signed   By: Kellie Simmering DO   On: 05/19/2021 12:07     Objective:  VS:  HT:    WT:   BMI:     BP:119/63  HR:80bpm  TEMP: ( )  RESP:  Physical Exam Vitals and nursing note reviewed.  Constitutional:      General: He is not in acute distress.    Appearance: Normal appearance. He is not ill-appearing.  HENT:     Head:  Normocephalic and atraumatic.     Right Ear: External ear normal.     Left Ear: External ear normal.     Nose: No congestion.  Eyes:     Extraocular Movements: Extraocular movements intact.  Cardiovascular:     Rate and Rhythm: Normal rate.     Pulses: Normal pulses.  Pulmonary:     Effort: Pulmonary effort is normal. No respiratory distress.  Abdominal:     General: There is no distension.     Palpations: Abdomen is soft.  Musculoskeletal:        General: No tenderness or signs of injury.     Cervical back: Neck supple.     Right lower leg: No edema.     Left lower leg: No edema.     Comments: Patient has good distal strength without clonus.  Skin:    Findings: No erythema or rash.  Neurological:     General: No focal deficit present.     Mental Status: He is alert and oriented to person, place, and time.     Sensory: No sensory deficit.     Motor: No weakness or abnormal muscle tone.     Coordination: Coordination normal.  Psychiatric:        Mood and Affect: Mood normal.        Behavior: Behavior normal.     Imaging: No results found.

## 2021-07-10 ENCOUNTER — Encounter: Payer: Self-pay | Admitting: Family Medicine

## 2021-07-10 ENCOUNTER — Ambulatory Visit: Payer: PPO | Admitting: Family Medicine

## 2021-07-10 ENCOUNTER — Other Ambulatory Visit: Payer: Self-pay

## 2021-07-10 VITALS — BP 150/72 | HR 86 | Ht 68.5 in | Wt 168.0 lb

## 2021-07-10 DIAGNOSIS — M2550 Pain in unspecified joint: Secondary | ICD-10-CM

## 2021-07-10 DIAGNOSIS — M5416 Radiculopathy, lumbar region: Secondary | ICD-10-CM

## 2021-07-10 DIAGNOSIS — M79605 Pain in left leg: Secondary | ICD-10-CM

## 2021-07-10 LAB — CBC WITH DIFFERENTIAL/PLATELET
Basophils Absolute: 0.1 10*3/uL (ref 0.0–0.1)
Basophils Relative: 0.7 % (ref 0.0–3.0)
Eosinophils Absolute: 0.3 10*3/uL (ref 0.0–0.7)
Eosinophils Relative: 2.2 % (ref 0.0–5.0)
HCT: 42.5 % (ref 39.0–52.0)
Hemoglobin: 14.2 g/dL (ref 13.0–17.0)
Lymphocytes Relative: 21.7 % (ref 12.0–46.0)
Lymphs Abs: 2.6 10*3/uL (ref 0.7–4.0)
MCHC: 33.5 g/dL (ref 30.0–36.0)
MCV: 92.9 fl (ref 78.0–100.0)
Monocytes Absolute: 1 10*3/uL (ref 0.1–1.0)
Monocytes Relative: 8.4 % (ref 3.0–12.0)
Neutro Abs: 7.9 10*3/uL — ABNORMAL HIGH (ref 1.4–7.7)
Neutrophils Relative %: 67 % (ref 43.0–77.0)
Platelets: 271 10*3/uL (ref 150.0–400.0)
RBC: 4.57 Mil/uL (ref 4.22–5.81)
RDW: 13.7 % (ref 11.5–15.5)
WBC: 11.8 10*3/uL — ABNORMAL HIGH (ref 4.0–10.5)

## 2021-07-10 LAB — TESTOSTERONE: Testosterone: 110.92 ng/dL — ABNORMAL LOW (ref 300.00–890.00)

## 2021-07-10 LAB — C-REACTIVE PROTEIN: CRP: <1 mg/dL (ref 0.5–20.0)

## 2021-07-10 LAB — FERRITIN: Ferritin: 137.2 ng/mL (ref 22.0–322.0)

## 2021-07-10 LAB — URIC ACID: Uric Acid, Serum: 7.4 mg/dL (ref 4.0–7.8)

## 2021-07-10 LAB — SEDIMENTATION RATE: Sed Rate: 16 mm/hr (ref 0–20)

## 2021-07-10 LAB — COMPREHENSIVE METABOLIC PANEL
ALT: 19 U/L (ref 0–53)
AST: 18 U/L (ref 0–37)
Albumin: 4.2 g/dL (ref 3.5–5.2)
Alkaline Phosphatase: 77 U/L (ref 39–117)
BUN: 20 mg/dL (ref 6–23)
CO2: 28 mEq/L (ref 19–32)
Calcium: 9.6 mg/dL (ref 8.4–10.5)
Chloride: 102 mEq/L (ref 96–112)
Creatinine, Ser: 1.13 mg/dL (ref 0.40–1.50)
GFR: 66 mL/min (ref >60.00)
Glucose, Bld: 124 mg/dL — ABNORMAL HIGH (ref 70–99)
Potassium: 4.4 mEq/L (ref 3.5–5.1)
Sodium: 136 mEq/L (ref 135–145)
Total Bilirubin: 0.4 mg/dL (ref 0.2–1.2)
Total Protein: 7.1 g/dL (ref 6.0–8.3)

## 2021-07-10 LAB — IBC PANEL
Iron: 49 ug/dL (ref 42–165)
Saturation Ratios: 13.5 % — ABNORMAL LOW (ref 20.0–50.0)
TIBC: 364 ug/dL (ref 250.0–450.0)
Transferrin: 260 mg/dL (ref 212.0–360.0)

## 2021-07-10 LAB — PHOSPHORUS: Phosphorus: 4.1 mg/dL (ref 2.3–4.6)

## 2021-07-10 LAB — VITAMIN D 25 HYDROXY (VIT D DEFICIENCY, FRACTURES): VITD: 33.23 ng/mL (ref 30.00–100.00)

## 2021-07-10 MED ORDER — ACYCLOVIR 400 MG PO TABS: 400.0000 mg | ORAL_TABLET | Freq: Three times a day (TID) | ORAL | 0 refills | Status: DC

## 2021-07-10 MED ORDER — GABAPENTIN 100 MG PO CAPS: 200.0000 mg | ORAL_CAPSULE | Freq: Every day | ORAL | 0 refills | Status: DC

## 2021-07-10 NOTE — Progress Notes (Signed)
Harold Day Phone: [phone] Subjective:   [name], am serving as a scribe for Dr. Hulan Saas. This visit occurred during the SARS-CoV-2 public health emergency.  Safety protocols were in place, including screening questions prior to the visit, additional usage of staff PPE, and extensive cleaning of exam room while observing appropriate contact time as indicated for disinfecting solutions.   I'm seeing this patient by the request  of:  Harold Lung, MD  CC: Low back pain and leg pain  ONG:[id number]  Harold Day is a 70 y.o. male coming in with complaint of low back pain and leg pain.  Has been seen by multiple different providers.  Has been in rehabilitation for quite some time.  Patient was seen by another provider and diagnosed more with a lumbar radiculopathy causing more the left hip pain.  X-rays of the left hip showed that patient did have mild osteoarthritic changes.  This was independently visualized by me.  Lumbar spine x-rays that were fairly unremarkable.  Patient was sent for an MRI of the lumbar spine that was also independently visualized by me.  MRI shows the patient has very mild spinal stenosis noted but overall fairly no significant narrowing.  Patient then was referred to a [facility] doctor and has had 2 injections with the last one in September 15. Pain is mostly in quad in L leg. Has tried physical therapy for hip which did not help. Takes Tylenol and IBU for pain.  Patient is also And tramadol and has been taking it regularly at night.     Past Medical History:  Diagnosis Date   Alcoholic ([place])    Allergic rhinitis    Depression    Former smoker    quit 2011   GERD (gastroesophageal reflux disease)    Hepatitis C    noticed when he tried to give blood; never had illness   Past Surgical History:  Procedure Laterality Date   24 HOUR [place] STUDY N/A 09/18/2014   Procedure: 24 HOUR [place]  STUDY;  Surgeon: Beryle Beams, MD;  Location: [facility];  Service: Endoscopy;  Laterality: N/A;   COLONOSCOPY     ESOPHAGEAL MANOMETRY N/A 09/18/2014   Procedure: ESOPHAGEAL MANOMETRY (EM);  Surgeon: Beryle Beams, MD;  Location: [facility];  Service: Endoscopy;  Laterality: N/A;   SHOULDER ARTHROSCOPY Left 04/2017   VASECTOMY     Social History   Socioeconomic History   Marital status: Married    Spouse name: Not on file   Number of children: 2   Years of education: Not on file   Highest education level: Not on file  Occupational History   Occupation: retired (tobacco buyer)    Employer: [employer]  Tobacco Use   Smoking status: Former   Smokeless tobacco: Current    Types: Snuff  Substance and Sexual Activity   Alcohol use: No    Comment: no alcohol since about 2000   Drug use: No   Sexual activity: Yes  Other Topics Concern   Not on file  Social History Narrative   Not on file   Social Determinants of Health   Financial Resource Strain: Not on file  Food Insecurity: Not on file  Transportation Needs: Not on file  Physical Activity: Not on file  Stress: Not on file  Social Connections: Not on file   Allergies  Allergen Reactions   Lactose Intolerance (Gi)  Family History  Problem Relation Age of Onset   Day cancer Mother    Autism Son      Current Outpatient Medications (Cardiovascular):    atorvastatin (LIPITOR) 20 MG tablet, Take 1 tablet (20 mg total) by mouth daily.   propranolol (INDERAL) 20 MG tablet, TAKE 3 TABLETS(60 MG) BY MOUTH DAILY AS NEEDED  Current Outpatient Medications (Respiratory):    promethazine (PHENERGAN) 25 MG tablet, Take 1 tablet (25 mg total) by mouth every 6 (six) hours as needed for nausea or vomiting.   triamcinolone (NASACORT AQ) 55 MCG/ACT nasal inhaler, Place 2 sprays into the nose daily.  Current Outpatient Medications (Analgesics):    aspirin 81 MG tablet, Take 81 mg by mouth daily.   colchicine  0.6 MG tablet, Take 2 tablets (1.2 mg) by mouth x 1 dose, then 1 tablet (0.6 mg) one hour later x 1 dose.   ibuprofen (ADVIL,MOTRIN) 200 MG tablet, Take 600 mg by mouth every 4 (four) hours.   traMADol (ULTRAM) 50 MG tablet, Take 1 tablet (50 mg total) by mouth 3 (three) times daily as needed.   Current Outpatient Medications (Other):    amoxicillin (AMOXIL) 875 MG tablet, Take 1 tablet (875 mg total) by mouth 2 (two) times daily.   esomeprazole (NEXIUM) 20 MG capsule, Take 20 mg by mouth daily at 12 noon.   fish oil-omega-3 fatty acids 1000 MG capsule, Take 2 g by mouth daily.   loperamide (IMODIUM A-D) 2 MG tablet, Take 1 tablet (2 mg total) by mouth at bedtime as needed for diarrhea or loose stools.   Multiple Vitamins-Minerals (MULTIVITAMIN WITH MINERALS) tablet, Take 1 tablet by mouth daily.   venlafaxine (EFFEXOR) 75 MG tablet, Take 1 tablet (75 mg total) by mouth 2 (two) times daily.   Reviewed prior external information including notes and imaging from  primary care provider As well as notes that were available from care everywhere and other healthcare systems.  Past medical history, social, surgical and family history all reviewed in electronic medical record.  No pertanent information unless stated regarding to the chief complaint.   Review of Systems:  No headache, visual changes, nausea, vomiting, diarrhea, constipation, dizziness, abdominal pain, skin rash, fevers, chills, night sweats, weight loss, swollen lymph nodes, b joint swelling, chest pain, shortness of breath, mood changes. POSITIVE muscle aches, body aches  Objective  There were no vitals taken for this visit.   General: No apparent distress alert and oriented x3 mood and affect normal, dressed appropriately.  HEENT: Pupils equal, extraocular movements intact  Respiratory: Patient's speak in full sentences and does not appear short of breath  Cardiovascular: No lower extremity edema, non tender, no erythema  Gait  mild antalgic Loss of lordosis of the lumbar spine noted.  Patient does have tightness with straight leg test.  Some mild worsening pain with extension.  Tender to palpation more in the left flank area.  Negative CVA tenderness.  Tightness with straight leg test noted patient on the lower extremity does have areas that seems to be sounds ongoing skin rash.  Could be consistent in a dermatomal fashion such as shingles.  Neurovascular intact distally.    Impression and Recommendations:     The above documentation has been reviewed and is accurate and complete Lyndal Pulley, DO

## 2021-07-10 NOTE — Patient Instructions (Addendum)
Gabapentin 200mg  at night Acyclovir 400mg  3x a day Labs today L LE EMG [facility] will call you See me in 2-4 weeks

## 2021-07-10 NOTE — Assessment & Plan Note (Signed)
Patient is a 70 symptoms that are somewhat consistent with a lumbar radiculopathy.  Patient has had injections after an MRI that was fairly normal with very minimal to no improvement.  At this point I would like patient to consider the possibility likely some mild skin changes of the posterior leg that this could be potentially shingles.  We will start on low-dose of acyclovir.  Started on gabapentin to help him with some of the nighttime pain and seems to be giving him more discomfort as well.  Discussed with patient that I do want him to do some range of motion.  Patient has been in formal physical therapy.  Continue tramadol every 8 as needed.  Follow-up with me again in 4 to 6 weeks

## 2021-07-11 ENCOUNTER — Encounter: Payer: Self-pay | Admitting: Neurology

## 2021-07-11 ENCOUNTER — Other Ambulatory Visit: Payer: Self-pay

## 2021-07-11 DIAGNOSIS — R202 Paresthesia of skin: Secondary | ICD-10-CM

## 2021-07-12 LAB — PTH, INTACT AND CALCIUM
Calcium: 9.5 mg/dL (ref 8.6–10.3)
PTH: 19 pg/mL (ref 16–77)

## 2021-07-17 ENCOUNTER — Telehealth: Payer: Self-pay | Admitting: Family Medicine

## 2021-07-17 NOTE — Telephone Encounter (Signed)
Sent patient MyChart message with recommendations.  

## 2021-07-17 NOTE — Telephone Encounter (Signed)
He could increase the gabapentin to 300mg  and we will wait the nerve conduction study

## 2021-07-17 NOTE — Telephone Encounter (Signed)
Pt called, states Dr. Tamala Julian diagnosed him with internal shingles. Pt took meds for 5 days and has no improvement, may actually be a little worse. Pt wondering if something else can be called in.  I did mention to patient that this may need to be followed by his PCP.

## 2021-07-31 ENCOUNTER — Ambulatory Visit: Payer: PPO | Admitting: Family Medicine

## 2021-08-01 ENCOUNTER — Other Ambulatory Visit: Payer: Self-pay

## 2021-08-01 ENCOUNTER — Ambulatory Visit: Payer: PPO | Admitting: Neurology

## 2021-08-01 DIAGNOSIS — R202 Paresthesia of skin: Secondary | ICD-10-CM | POA: Diagnosis not present

## 2021-08-01 DIAGNOSIS — M79605 Pain in left leg: Secondary | ICD-10-CM

## 2021-08-01 NOTE — Procedures (Signed)
[facility]  [address]  [place], [place] [address] Tel: [phone] Fax:  [phone] Test Date:  08/01/2021  Patient: Harold Day DOB: 1951-04-25 Physician: Narda Amber, DO  Sex: Male Height: 5\' 8"  Ref Phys: Hulan Saas, DO  ID#: [id number]   Technician:    Patient Complaints: This is a 70 year old man referred for evaluation of left leg pain.  NCV & EMG Findings: Extensive electrodiagnostic testing of the left lower extremity shows:  Left sural and superficial peroneal sensory responses are within normal limits Left peroneal and tibial motor responses are within normal limits. Left tibial H reflex study is within normal limits There is no evidence of active or chronic motor axonal loss changes affecting any of the tested muscles.  Motor unit configuration and recruitment pattern is within normal limits.  Impression: This is a normal study of the left lower extremity.  In particular, there is no evidence of a sensorimotor polyneuropathy or lumbosacral radiculopathy.   ___________________________ Narda Amber, DO    Nerve Conduction Studies Anti Sensory Summary Table   Stim Site NR Peak (ms) Norm Peak (ms) P-T Amp (V) Norm P-T Amp  Left Sup Peroneal Anti Sensory (Ant Lat Mall)  32C  12 cm    2.9 <4.6 9.2 >3  Left Sural Anti Sensory (Lat Mall)  32C  Calf    3.6 <4.6 9.5 >3   Motor Summary Table   Stim Site NR Onset (ms) Norm Onset (ms) O-P Amp (mV) Norm O-P Amp Site1 Site2 Delta-0 (ms) Dist (cm) Vel (m/s) Norm Vel (m/s)  Left Peroneal Motor (Ext Dig Brev)  32C  Ankle    4.0 <6.0 7.1 >2.5 B Fib Ankle 7.4 33.0 45 >40  B Fib    11.4  6.7  Poplt B Fib 2.0 9.0 45 >40  Poplt    13.4  6.5         Left Tibial Motor (Abd Hall Brev)  32C  Ankle    4.3 <6.0 13.6 >4 Knee Ankle 8.7 42.0 48 >40  Knee    13.0  12.3          H Reflex Studies   NR H-Lat (ms) Lat Norm (ms) L-R H-Lat (ms)  Left Tibial (Gastroc)  32C     34.63 <35    EMG    Side Muscle Ins Act Fibs Psw Fasc Number Recrt Dur Dur. Amp Amp. Poly Poly. Comment  Left AntTibialis Nml Nml Nml Nml Nml Nml Nml Nml Nml Nml Nml Nml N/A  Left Flex Dig Long Nml Nml Nml Nml Nml Nml Nml Nml Nml Nml Nml Nml N/A  Left Gastroc Nml Nml Nml Nml Nml Nml Nml Nml Nml Nml Nml Nml N/A  Left RectFemoris Nml Nml Nml Nml Nml Nml Nml Nml Nml Nml Nml Nml N/A  Left GluteusMed Nml Nml Nml Nml Nml Nml Nml Nml Nml Nml Nml Nml N/A      Waveforms:

## 2021-08-06 ENCOUNTER — Ambulatory Visit: Payer: PPO | Admitting: Family Medicine

## 2021-08-06 ENCOUNTER — Encounter: Payer: Self-pay | Admitting: Family Medicine

## 2021-08-06 ENCOUNTER — Other Ambulatory Visit: Payer: Self-pay

## 2021-08-06 VITALS — BP 118/86 | HR 83 | Ht 68.5 in | Wt 171.0 lb

## 2021-08-06 DIAGNOSIS — M5416 Radiculopathy, lumbar region: Secondary | ICD-10-CM | POA: Diagnosis not present

## 2021-08-06 MED ORDER — KETOROLAC TROMETHAMINE 30 MG/ML IJ SOLN
30.0000 mg | Freq: Once | INTRAMUSCULAR | Status: AC
  Administered 2021-08-06: 30 mg via INTRAMUSCULAR

## 2021-08-06 MED ORDER — METHYLPREDNISOLONE ACETATE 40 MG/ML IJ SUSP
40.0000 mg | Freq: Once | INTRAMUSCULAR | Status: AC
  Administered 2021-08-06: 40 mg via INTRAMUSCULAR

## 2021-08-06 NOTE — Patient Instructions (Signed)
  DHEA 50mg  daily for 4 weeks then 2 weeks off Stay active Ask PCP about Effexor to Cymbalta See me in 4-5 weeks and will recheck testosterone

## 2021-08-06 NOTE — Progress Notes (Signed)
[place] Phone: [phone] Subjective:   [name], am serving as a scribe for Dr. Hulan Saas. This visit occurred during the SARS-CoV-2 public health emergency.  Safety protocols were in place, including screening questions prior to the visit, additional usage of staff PPE, and extensive cleaning of exam room while observing appropriate contact time as indicated for disinfecting solutions.   I'm seeing this patient by the request  of:  Denita Lung, MD  CC: Low back pain follow-up  [id number]  07/10/2021 Patient is a 70 symptoms that are somewhat consistent with a lumbar radiculopathy.  Patient has had injections after an MRI that was fairly normal with very minimal to no improvement.  At this point I would like patient to consider the possibility likely some mild skin changes of the posterior leg that this could be potentially shingles.  We will start on low-dose of acyclovir.  Started on gabapentin to help him with some of the nighttime pain and seems to be giving him more discomfort as well.  Discussed with patient that I do want him to do some range of motion.  Patient has been in formal physical therapy.  Continue tramadol every 8 as needed.  Follow-up with me again in 4 to 6 weeks  Update 08/06/2021 Harold Day is a 70 y.o. male coming in with complaint of lumbar radiculopathy. Patient did 7 weeks of physical therapy that did not help his pain. States that epidurals have not helped either. Pain is constant. Burning sensation in L adductor to the knee.  Patient states that it is fairly severe.  Continuing to give him difficulty at night as well.  Did not notice any significant improvement with acyclovir.  Patient did have laboratory work-up.  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 of 110      Past Medical History:  Diagnosis Date   Alcoholic ([place])    Allergic rhinitis    Depression     Former smoker    quit 2011   GERD (gastroesophageal reflux disease)    Hepatitis C    noticed when he tried to give blood; never had illness   Past Surgical History:  Procedure Laterality Date   24 HOUR [place] STUDY N/A 09/18/2014   Procedure: 24 HOUR [place] STUDY;  Surgeon: Beryle Beams, MD;  Location: [facility];  Service: Endoscopy;  Laterality: N/A;   COLONOSCOPY     ESOPHAGEAL MANOMETRY N/A 09/18/2014   Procedure: ESOPHAGEAL MANOMETRY (EM);  Surgeon: Beryle Beams, MD;  Location: [facility];  Service: Endoscopy;  Laterality: N/A;   SHOULDER ARTHROSCOPY Left 04/2017   VASECTOMY     Social History   Socioeconomic History   Marital status: Married    Spouse name: Not on file   Number of children: 2   Years of education: Not on file   Highest education level: Not on file  Occupational History   Occupation: retired (tobacco buyer)    Employer: [employer]  Tobacco Use   Smoking status: Former   Smokeless tobacco: Current    Types: Snuff  Substance and Sexual Activity   Alcohol use: No    Comment: no alcohol since about 2000   Drug use: No   Sexual activity: Yes  Other Topics Concern   Not on file  Social History Narrative   Not on file   Social Determinants of Health   Financial Resource Strain: Not on file  Food Insecurity: Not on file  Transportation Needs: Not on file  Physical Activity: Not on file  Stress: Not on file  Social Connections: Not on file   Allergies  Allergen Reactions   Lactose Intolerance (Gi)    Family History  Problem Relation Age of Onset   Lung cancer Mother    Autism Son      Current Outpatient Medications (Cardiovascular):    atorvastatin (LIPITOR) 20 MG tablet, Take 1 tablet (20 mg total) by mouth daily.   propranolol (INDERAL) 20 MG tablet, TAKE 3 TABLETS(60 MG) BY MOUTH DAILY AS NEEDED  Current Outpatient Medications (Respiratory):    promethazine (PHENERGAN) 25 MG tablet, Take 1 tablet (25 mg total) by mouth  every 6 (six) hours as needed for nausea or vomiting.   triamcinolone (NASACORT AQ) 55 MCG/ACT nasal inhaler, Place 2 sprays into the nose daily.  Current Outpatient Medications (Analgesics):    aspirin 81 MG tablet, Take 81 mg by mouth daily.   colchicine 0.6 MG tablet, Take 2 tablets (1.2 mg) by mouth x 1 dose, then 1 tablet (0.6 mg) one hour later x 1 dose.   ibuprofen (ADVIL,MOTRIN) 200 MG tablet, Take 600 mg by mouth every 4 (four) hours.   traMADol (ULTRAM) 50 MG tablet, Take 1 tablet (50 mg total) by mouth 3 (three) times daily as needed.   Current Outpatient Medications (Other):    acyclovir (ZOVIRAX) 400 MG tablet, Take 1 tablet (400 mg total) by mouth 3 (three) times daily.   amoxicillin (AMOXIL) 875 MG tablet, Take 1 tablet (875 mg total) by mouth 2 (two) times daily.   esomeprazole (NEXIUM) 20 MG capsule, Take 20 mg by mouth daily at 12 noon.   fish oil-omega-3 fatty acids 1000 MG capsule, Take 2 g by mouth daily.   gabapentin (NEURONTIN) 100 MG capsule, Take 2 capsules (200 mg total) by mouth at bedtime.   loperamide (IMODIUM A-D) 2 MG tablet, Take 1 tablet (2 mg total) by mouth at bedtime as needed for diarrhea or loose stools.   Multiple Vitamins-Minerals (MULTIVITAMIN WITH MINERALS) tablet, Take 1 tablet by mouth daily.   venlafaxine (EFFEXOR) 75 MG tablet, Take 1 tablet (75 mg total) by mouth 2 (two) times daily.   Reviewed prior external information including notes and imaging from  primary care provider As well as notes that were available from care everywhere and other healthcare systems.  Past medical history, social, surgical and family history all reviewed in electronic medical record.  No pertanent information unless stated regarding to the chief complaint.   Review of Systems:  No headache, visual changes, nausea, vomiting, diarrhea, constipation, dizziness, abdominal pain, skin rash, fevers, chills, night sweats, weight loss, swollen lymph nodes, body aches,  joint swelling, chest pain, shortness of breath, mood changes. POSITIVE muscle aches  Objective  Blood pressure 118/86, pulse 83, height 5' 8.5" (1.74 m), weight 171 lb (77.6 kg), SpO2 96 %.   General: No apparent distress alert and oriented x3 mood and affect normal, dressed appropriately.  HEENT: Pupils equal, extraocular movements intact  Respiratory: Patient's speak in full sentences and does not appear short of breath  Cardiovascular: No lower extremity edema, non tender, no erythema  Gait normal with good balance and coordination.  MSK: Low back exam does have some loss of lordosis.  Some tenderness to palpation in the paraspinal musculature. Tightness noted with FABER test bilaterally.    Impression and Recommendations:     The above documentation has been reviewed and  is accurate and complete Lyndal Pulley, DO

## 2021-08-06 NOTE — Assessment & Plan Note (Signed)
Since we have seen patient patient will have another epidural in the back done that did not have any significant improvement.  Patient continues to have pain going down the legs.  States that he is unable to do a significant amount of activity.  Patient is continued on the pain medication by another provider.  Given Toradol and Depo-Medrol today and hopefully that this will make some improvement as well.  We discussed with patient being on the Effexor may be a potential change to Cymbalta could be beneficial.  We will discussed with patient's primary care and see if they would consider this or he can discuss with them as well.  Follow-up with me again 4 to 8 weeks.  Total time reviewing patient's imaging, and discussing with patient, discussing the different treatment options greater than 32 minutes

## 2021-08-12 ENCOUNTER — Ambulatory Visit: Payer: PPO | Admitting: Family Medicine

## 2021-09-04 NOTE — Progress Notes (Signed)
[place] Phone: [phone] Subjective:   Harold [name], am serving as a scribe for Harold [name].  This visit occurred during the SARS-CoV-2 public health emergency.  Safety protocols were in place, including screening questions prior to the visit, additional usage of staff PPE, and extensive cleaning of exam room while observing appropriate contact time as indicated for disinfecting solutions.   I'm seeing this patient by the request  of:  Harold Lung, MD  CC: Back pain and also pain  [id number]  08/06/2021 Since we have seen patient patient will have another epidural in the back done that did not have any significant improvement.  Patient continues to have pain going down the legs.  States that he is unable to do a significant amount of activity.  Patient is continued on the pain medication by another provider.  Given Toradol and Depo-Medrol today and hopefully that this will make some improvement as well.  We discussed with patient being on the Effexor may be a potential change to Cymbalta could be beneficial.  We will discussed with patient's primary care and see if they would consider this or he can discuss with them as well.  Follow-up with me again 4 to 8 weeks.  Total time reviewing patient's imaging, and discussing with patient, discussing the different treatment options greater than 32 minutes  Update 09/05/2021 Harold [name] is a 70 y.o. male coming in with complaint of LBP. Patient states that he is worse than last visit. Pain L leg and lower back. Also notes sharp pain lower RQ for 2 weeks. Patient stopped taking gabapentin, Tramdol as they are not working for pain relief.    MRI lumbar spine 05/18/2021 IMPRESSION: Lumbar spondylosis, as outlined. [name] more than mild spinal canal stenosis. [name] significant neural foraminal narrowing.   Mild multilevel disc degeneration, greatest at T12-L1.    Trace T12-L1 and L1-L2 grade 1 retrolisthesis.    Past Medical History:  Diagnosis Date   Alcoholic ([place])    Allergic rhinitis    Depression    Former smoker    quit 2011   GERD (gastroesophageal reflux disease)    Hepatitis C    noticed when he tried to give blood; never had illness   Past Surgical History:  Procedure Laterality Date   24 HOUR [place] STUDY N/A 09/18/2014   Procedure: 24 HOUR [place] STUDY;  Surgeon: Harold Beams, MD;  [facility]: [facility];  Service: Endoscopy;  Laterality: N/A;   COLONOSCOPY     ESOPHAGEAL MANOMETRY N/A 09/18/2014   Procedure: ESOPHAGEAL MANOMETRY (EM);  Surgeon: Harold Beams, MD;  [facility]: [facility];  Service: Endoscopy;  Laterality: N/A;   SHOULDER ARTHROSCOPY Left 04/2017   VASECTOMY     Social History   Socioeconomic History   Marital status: Married    Spouse name: Not on file   Number of children: 2   Years of education: Not on file   Highest education level: Not on file  Occupational History   Occupation: retired (tobacco buyer)    Employer: [employer]  Tobacco Use   Smoking status: Former   Smokeless tobacco: Current    Types: Snuff  Substance and Sexual Activity   Alcohol use: [name]    Comment: [name] alcohol since about 2000   Drug use: [name]   Sexual activity: Yes  Other Topics Concern   Not on file  Social History Narrative  Not on file   Social Determinants of Health   Financial Resource Strain: Not on file  Food Insecurity: Not on file  Transportation Needs: Not on file  Physical Activity: Not on file  Stress: Not on file  Social Connections: Not on file   Allergies  Allergen Reactions   Lactose Intolerance (Gi)    Family History  Problem Relation Age of Onset   [name] cancer Mother    Autism Son      Current Outpatient Medications (Cardiovascular):    atorvastatin (LIPITOR) 20 MG tablet, Take 1 tablet (20 mg total) by mouth daily.   propranolol (INDERAL) 20 MG tablet, TAKE 3 TABLETS(60 MG)  BY MOUTH DAILY AS NEEDED  Current Outpatient Medications (Respiratory):    promethazine (PHENERGAN) 25 MG tablet, Take 1 tablet (25 mg total) by mouth every 6 (six) hours as needed for nausea or vomiting.   triamcinolone (NASACORT AQ) 55 MCG/ACT nasal inhaler, Place 2 sprays into the nose daily.  Current Outpatient Medications (Analgesics):    allopurinol (ZYLOPRIM) 100 MG tablet, Take 2 tablets (200 mg total) by mouth daily.   aspirin 81 MG tablet, Take 81 mg by mouth daily.   colchicine 0.6 MG tablet, Take 2 tablets (1.2 mg) by mouth x 1 dose, then 1 tablet (0.6 mg) one hour later x 1 dose.   colchicine 0.6 MG tablet, Take 1 tablet (0.6 mg total) by mouth daily.   ibuprofen (ADVIL,MOTRIN) 200 MG tablet, Take 600 mg by mouth every 4 (four) hours.   traMADol (ULTRAM) 50 MG tablet, Take 1 tablet (50 mg total) by mouth 3 (three) times daily as needed.   Current Outpatient Medications (Other):    acyclovir (ZOVIRAX) 400 MG tablet, Take 1 tablet (400 mg total) by mouth 3 (three) times daily.   amoxicillin (AMOXIL) 875 MG tablet, Take 1 tablet (875 mg total) by mouth 2 (two) times daily.   DULoxetine (CYMBALTA) 20 MG capsule, Take 2 capsules (40 mg total) by mouth daily.   esomeprazole (NEXIUM) 20 MG capsule, Take 20 mg by mouth daily at 12 noon.   fish oil-omega-3 fatty acids 1000 MG capsule, Take 2 g by mouth daily.   gabapentin (NEURONTIN) 100 MG capsule, Take 2 capsules (200 mg total) by mouth at bedtime.   loperamide (IMODIUM A-D) 2 MG tablet, Take 1 tablet (2 mg total) by mouth at bedtime as needed for diarrhea or loose stools.   Multiple Vitamins-Minerals (MULTIVITAMIN WITH MINERALS) tablet, Take 1 tablet by mouth daily.   venlafaxine (EFFEXOR) 75 MG tablet, Take 1 tablet (75 mg total) by mouth 2 (two) times daily.   Reviewed prior external information including notes and imaging from  primary care provider As well as notes that were available from care everywhere and other healthcare  systems.  Past medical history, social, surgical and family history all reviewed in electronic medical record.  [name] pertanent information unless stated regarding to the chief complaint.   Review of Systems:  [name] headache, visual changes, nausea, vomiting, diarrhea, constipation, dizziness, abdominal pain, skin rash, fevers, chills, night sweats, weight loss, swollen lymph nodes,joint swelling, chest pain, shortness of breath, mood changes. POSITIVE muscle aches, body aches  Objective  Blood pressure 122/82, pulse 79, height 5' 8.5" (1.74 m), weight 171 lb (77.6 kg), SpO2 99 %.   General: [name] apparent distress alert and oriented x3 mood and affect normal, dressed appropriately.  HEENT: Pupils equal, extraocular movements intact  Respiratory: Patient's speak in full sentences and does not appear  short of breath  Cardiovascular: [name] lower extremity edema, non tender, [name] erythema  Gait normal with good balance and coordination.  MSK: Back exam does have significant loss of lordosis.  Patient is very uncomfortable sitting.  Neurovascular intact and does have atrophy of the lower extremities. Patient's pain is fairly severe to even light palpation.  He does have tightness with straight leg test but [name] true radicular symptoms but worsening pain down the legs with some extension of the back.   Impression and Recommendations:     The above documentation has been reviewed and is accurate and complete Lyndal Pulley, DO

## 2021-09-05 ENCOUNTER — Ambulatory Visit: Payer: PPO | Admitting: Family Medicine

## 2021-09-05 ENCOUNTER — Other Ambulatory Visit: Payer: Self-pay

## 2021-09-05 ENCOUNTER — Other Ambulatory Visit: Payer: Self-pay | Admitting: Family Medicine

## 2021-09-05 ENCOUNTER — Encounter: Payer: Self-pay | Admitting: Family Medicine

## 2021-09-05 VITALS — BP 122/82 | HR 79 | Ht 68.5 in | Wt 171.0 lb

## 2021-09-05 DIAGNOSIS — Z8739 Personal history of other diseases of the musculoskeletal system and connective tissue: Secondary | ICD-10-CM

## 2021-09-05 DIAGNOSIS — M5416 Radiculopathy, lumbar region: Secondary | ICD-10-CM

## 2021-09-05 DIAGNOSIS — M2550 Pain in unspecified joint: Secondary | ICD-10-CM

## 2021-09-05 DIAGNOSIS — F341 Dysthymic disorder: Secondary | ICD-10-CM

## 2021-09-05 LAB — FERRITIN: Ferritin: 136.2 ng/mL (ref 22.0–322.0)

## 2021-09-05 LAB — VITAMIN D 25 HYDROXY (VIT D DEFICIENCY, FRACTURES): VITD: 46.14 ng/mL (ref 30.00–100.00)

## 2021-09-05 LAB — IBC PANEL
Iron: 50 ug/dL (ref 42–165)
Saturation Ratios: 13.3 % — ABNORMAL LOW (ref 20.0–50.0)
TIBC: 375.2 ug/dL (ref 250.0–450.0)
Transferrin: 268 mg/dL (ref 212.0–360.0)

## 2021-09-05 LAB — SEDIMENTATION RATE: Sed Rate: 27 mm/hr — ABNORMAL HIGH (ref 0–20)

## 2021-09-05 LAB — URIC ACID: Uric Acid, Serum: 7.3 mg/dL (ref 4.0–7.8)

## 2021-09-05 LAB — TESTOSTERONE: Testosterone: 171.12 ng/dL — ABNORMAL LOW (ref 300.00–890.00)

## 2021-09-05 MED ORDER — DULOXETINE HCL 20 MG PO CPEP: 40.0000 mg | ORAL_CAPSULE | Freq: Every day | ORAL | 0 refills | Status: DC

## 2021-09-05 MED ORDER — ALLOPURINOL 100 MG PO TABS: 200.0000 mg | ORAL_TABLET | Freq: Every day | ORAL | 0 refills | Status: DC

## 2021-09-05 MED ORDER — KETOROLAC TROMETHAMINE 60 MG/2ML IM SOLN
60.0000 mg | Freq: Once | INTRAMUSCULAR | Status: AC
  Administered 2021-09-05: 11:00:00 60 mg via INTRAMUSCULAR

## 2021-09-05 MED ORDER — METHYLPREDNISOLONE ACETATE 80 MG/ML IJ SUSP
80.0000 mg | Freq: Once | INTRAMUSCULAR | Status: AC
  Administered 2021-09-05: 11:00:00 80 mg via INTRAMUSCULAR

## 2021-09-05 MED ORDER — COLCHICINE 0.6 MG PO TABS: 0.6000 mg | ORAL_TABLET | Freq: Every day | ORAL | 0 refills | Status: AC

## 2021-09-05 NOTE — Assessment & Plan Note (Signed)
Patient There is making any significant progress with the injections.  Anything that this could be something more systemic.  Does have a history of gout and will treat for this.  Did prescribe the allopurinol as well as colchicine and patient will hold his atorvastatin for 5 days while he was on the medication.  Then restarted immediately.  Did have some elevation in the uric acid at 7.4.  In addition to this he had low testosterone so we will recheck.  Does have a history of benign prostatic hypertrophy so we will need to monitor.  May need endocrinology.  We will change patient's Effexor to Cymbalta to see if this will get some more pain relief as well.  Patient did not notice any significant improvement with the gabapentin.  Has had follow-up with me again within 2 weeks.

## 2021-09-05 NOTE — Patient Instructions (Addendum)
Labs today Cochicine daily for 5 days-do not take statin Allopurinol 200mg  daily Stop Effexor start Cymbalta 40mg  Injections in backside today Send me message in 1 week to report how feeling See me on November 28th

## 2021-09-05 NOTE — Assessment & Plan Note (Signed)
Discussed through check function in the electronic medical record with primary care provider previously and will be changing the Effexor to Cymbalta and will start at 40 mg with the idea at follow-up considering increasing to 60 mg thereafter.

## 2021-09-10 ENCOUNTER — Telehealth: Payer: Self-pay | Admitting: Family Medicine

## 2021-09-10 NOTE — Telephone Encounter (Signed)
Pt states he was advised to give [place] an update before his visit 11/28. He is having trouble with MyChart. There has been no change, if anything he is worsening. He attended a wedding last weekend and had significant pain after standing/walking for 3 hours.

## 2021-09-10 NOTE — Progress Notes (Signed)
[place] [address] [place] Phone: [phone] Subjective:   IVilma Meckel, am serving as a scribe for Dr. Hulan Saas. This visit occurred during the SARS-CoV-2 public health emergency.  Safety protocols were in place, including screening questions prior to the visit, additional usage of staff PPE, and extensive cleaning of exam room while observing appropriate contact time as indicated for disinfecting solutions.   I'm seeing this patient by the request  of:  Denita Lung, MD  CC: Back pain follow-up  [id number]  09/05/2021 Discussed through check function in the electronic medical record with primary care provider previously and will be changing the Effexor to Cymbalta and will start at 40 mg with the idea at follow-up considering increasing to 60 mg thereafter.  Patient There is making any significant progress with the injections.  Anything that this could be something more systemic.  Does have a history of gout and will treat for this.  Did prescribe the allopurinol as well as colchicine and patient will hold his atorvastatin for 5 days while he was on the medication.  Then restarted immediately.  Did have some elevation in the uric acid at 7.4.  In addition to this he had low testosterone so we will recheck.  Does have a history of benign prostatic hypertrophy so we will need to monitor.  May need endocrinology.  We will change patient's Effexor to Cymbalta to see if this will get some more pain relief as well.  Patient did not notice any significant improvement with the gabapentin.  Has had follow-up with me again within 2 weeks.   Update 09/16/2021 Harold Day is a 70 y.o. male coming in with complaint of lumbar spine pain.  Patient did have untreated elevated uric acid.  Also was found to have low testosterone.  Change patient's Effexor to Cymbalta at last exam.  Patient states back pain had gotten worse. Was having  constant pins and needles in left leg and is now in abdominal region. Ringing in both ears.  Reviewed patient's MRI of the lumbar spine again that was from July of this year.  MRIs show the patient did have spondylosis with mild spinal canal stenosis.  Patient has undergone 2 epidurals.  Repeat laboratory work-up showed testosterone still low at 171 but shows some improvement from previous exam of 110 patient noted to have increasing sedimentation rate of 27    Past Medical History:  Diagnosis Date   Alcoholic ([place])    Allergic rhinitis    Depression    Former smoker    quit 2011   GERD (gastroesophageal reflux disease)    Hepatitis C    noticed when he tried to give blood; never had illness   Past Surgical History:  Procedure Laterality Date   24 HOUR [place] STUDY N/A 09/18/2014   Procedure: [place] STUDY;  Surgeon: Beryle Beams, MD;  [facility];  Service: Endoscopy;  Laterality: N/A;   COLONOSCOPY     ESOPHAGEAL MANOMETRY N/A 09/18/2014   Procedure: ESOPHAGEAL MANOMETRY (EM);  Surgeon: Beryle Beams, MD;  [facility];  Service: Endoscopy;  Laterality: N/A;   SHOULDER ARTHROSCOPY Left 04/2017   VASECTOMY     Social History   Socioeconomic History   Marital status: Married    Spouse name: Not on file   Number of children: 2   Years of education: Not on file   Highest education level: Not on file  Occupational History   Occupation: retired Ecologist)    Employer: [employer]  Tobacco Use   Smoking status: Former   Smokeless tobacco: Current    Types: Snuff  Substance and Sexual Activity   Alcohol use: No    Comment: no alcohol since about 2000   Drug use: No   Sexual activity: Yes  Other Topics Concern   Not on file  Social History Narrative   Not on file   Social Determinants of Health   Financial Resource Strain: Not on file  Food Insecurity: Not on file  Transportation Needs: Not on file  Physical Activity: Not on  file  Stress: Not on file  Social Connections: Not on file   Allergies  Allergen Reactions   Lactose Intolerance (Gi)    Family History  Problem Relation Age of Onset   Lung cancer Mother    Autism Son      Current Outpatient Medications (Cardiovascular):    atorvastatin (LIPITOR) 20 MG tablet, Take 1 tablet (20 mg total) by mouth daily.   propranolol (INDERAL) 20 MG tablet, TAKE 3 TABLETS(60 MG) BY MOUTH DAILY AS NEEDED  Current Outpatient Medications (Respiratory):    promethazine (PHENERGAN) 25 MG tablet, Take 1 tablet (25 mg total) by mouth every 6 (six) hours as needed for nausea or vomiting.   triamcinolone (NASACORT AQ) 55 MCG/ACT nasal inhaler, Place 2 sprays into the nose daily.  Current Outpatient Medications (Analgesics):    allopurinol (ZYLOPRIM) 100 MG tablet, Take 2 tablets (200 mg total) by mouth daily.   aspirin 81 MG tablet, Take 81 mg by mouth daily.   colchicine 0.6 MG tablet, Take 2 tablets (1.2 mg) by mouth x 1 dose, then 1 tablet (0.6 mg) one hour later x 1 dose.   colchicine 0.6 MG tablet, Take 1 tablet (0.6 mg total) by mouth daily.   ibuprofen (ADVIL,MOTRIN) 200 MG tablet, Take 600 mg by mouth every 4 (four) hours.   traMADol (ULTRAM) 50 MG tablet, Take 1 tablet (50 mg total) by mouth 3 (three) times daily as needed.   Current Outpatient Medications (Other):    DULoxetine (CYMBALTA) 30 MG capsule, Take 1 capsule (30 mg total) by mouth daily.   acyclovir (ZOVIRAX) 400 MG tablet, Take 1 tablet (400 mg total) by mouth 3 (three) times daily.   amoxicillin (AMOXIL) 875 MG tablet, Take 1 tablet (875 mg total) by mouth 2 (two) times daily.   DULoxetine (CYMBALTA) 20 MG capsule, Take 2 capsules (40 mg total) by mouth daily.   esomeprazole (NEXIUM) 20 MG capsule, Take 20 mg by mouth daily at 12 noon.   fish oil-omega-3 fatty acids 1000 MG capsule, Take 2 g by mouth daily.   gabapentin (NEURONTIN) 100 MG capsule, Take 2 capsules (200 mg total) by mouth at  bedtime.   loperamide (IMODIUM A-D) 2 MG tablet, Take 1 tablet (2 mg total) by mouth at bedtime as needed for diarrhea or loose stools.   Multiple Vitamins-Minerals (MULTIVITAMIN WITH MINERALS) tablet, Take 1 tablet by mouth daily.   Reviewed prior external information including notes and imaging from  primary care provider As well as notes that were available from care everywhere and other healthcare systems. As stated above   Past medical history, social, surgical and family history all reviewed in electronic medical record.  No pertanent information unless stated regarding to the chief complaint.   Review of Systems:  No headache, visual changes, nausea, vomiting, diarrhea, constipation, dizziness, abdominal pain, skin  rash, fevers, chills, night sweats, weight loss, swollen lymph nodes, joint swelling, chest pain, shortness of breath, mood changes. POSITIVE muscle aches, body aches  Objective  Blood pressure 124/80, pulse 86, height 5\' 8"  (1.727 m), weight 171 lb (77.6 kg), SpO2 97 %.   General: No apparent distress alert and oriented x3 mood and affect normal, dressed appropriately.  HEENT: Pupils equal, extraocular movements intact  Respiratory: Patient's speak in full sentences and does not appear short of breath  Cardiovascular: No lower extremity edema, non tender, no erythema  Gait normal with good balance and coordination.  MSK: Patient does have some loss of lordosis.  Tenderness to palpation diffusely of the lumbar spine.  Patient does have a mild cough at baseline and does cause some radiation going around his sides he states.  Neurovascularly intact distally.  Patient does have some mild difficulty getting out of the chair.    Impression and Recommendations:     The above documentation has been reviewed and is accurate and complete Lyndal Pulley, DO

## 2021-09-16 ENCOUNTER — Encounter: Payer: Self-pay | Admitting: Family Medicine

## 2021-09-16 ENCOUNTER — Other Ambulatory Visit: Payer: Self-pay

## 2021-09-16 ENCOUNTER — Ambulatory Visit (INDEPENDENT_AMBULATORY_CARE_PROVIDER_SITE_OTHER): Payer: PPO

## 2021-09-16 ENCOUNTER — Ambulatory Visit: Payer: PPO | Admitting: Family Medicine

## 2021-09-16 VITALS — BP 124/80 | HR 86 | Ht 68.0 in | Wt 171.0 lb

## 2021-09-16 DIAGNOSIS — M5416 Radiculopathy, lumbar region: Secondary | ICD-10-CM

## 2021-09-16 DIAGNOSIS — R0989 Other specified symptoms and signs involving the circulatory and respiratory systems: Secondary | ICD-10-CM

## 2021-09-16 DIAGNOSIS — R7989 Other specified abnormal findings of blood chemistry: Secondary | ICD-10-CM | POA: Diagnosis not present

## 2021-09-16 DIAGNOSIS — R911 Solitary pulmonary nodule: Secondary | ICD-10-CM

## 2021-09-16 DIAGNOSIS — R079 Chest pain, unspecified: Secondary | ICD-10-CM

## 2021-09-16 DIAGNOSIS — F341 Dysthymic disorder: Secondary | ICD-10-CM

## 2021-09-16 MED ORDER — DULOXETINE HCL 30 MG PO CPEP: 30.0000 mg | ORAL_CAPSULE | Freq: Every day | ORAL | 0 refills | Status: DC

## 2021-09-16 NOTE — Assessment & Plan Note (Signed)
Patient is likely having some depression that could be potentially primary concern or could be secondary to the chronic pain at the moment.  Continue to monitor.  Increasing Cymbalta to 60 mg

## 2021-09-16 NOTE — Assessment & Plan Note (Signed)
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  Still has the signs and symptoms consistent with the spinal stenosis.  We will increase patient's Cymbalta to 60 mg.  See if this will help some more of the pain.  Patient did have some mild ringing in the ears and we will monitor.  Have started the allopurinol but has been not for very long time at the moment.  Do need to consider the possibility of repeating laboratory work-up in 4 weeks.  Patient wants to avoid another round of steroids secondary to the discomfort.  Patient is continuing on his tramadol that he has been given previously by another provider.  We will have patient follow-up again in 4 weeks.

## 2021-09-16 NOTE — Patient Instructions (Addendum)
Cymbalta increase to 60mg  Referral to Endocrinology Continue the DHEA Watch the ringing in the ears Xrays today See you again in 4 weeks

## 2021-09-16 NOTE — Assessment & Plan Note (Signed)
Patient is complaining of fatigue by 5 PM every day.  Could be secondary to the low testosterone.  Patient has not made significant improvement but is taking the DHEA.  Patient will be referred to endocrinology for further evaluation.

## 2021-09-19 DIAGNOSIS — H40033 Anatomical narrow angle, bilateral: Secondary | ICD-10-CM | POA: Diagnosis not present

## 2021-09-19 DIAGNOSIS — H2513 Age-related nuclear cataract, bilateral: Secondary | ICD-10-CM | POA: Diagnosis not present

## 2021-09-23 ENCOUNTER — Ambulatory Visit (INDEPENDENT_AMBULATORY_CARE_PROVIDER_SITE_OTHER): Payer: PPO

## 2021-09-23 ENCOUNTER — Other Ambulatory Visit: Payer: Self-pay

## 2021-09-23 DIAGNOSIS — R9389 Abnormal findings on diagnostic imaging of other specified body structures: Secondary | ICD-10-CM | POA: Diagnosis not present

## 2021-09-23 DIAGNOSIS — R079 Chest pain, unspecified: Secondary | ICD-10-CM

## 2021-09-23 DIAGNOSIS — R918 Other nonspecific abnormal finding of lung field: Secondary | ICD-10-CM | POA: Diagnosis not present

## 2021-09-23 DIAGNOSIS — R911 Solitary pulmonary nodule: Secondary | ICD-10-CM

## 2021-09-23 DIAGNOSIS — I700 Atherosclerosis of aorta: Secondary | ICD-10-CM | POA: Diagnosis not present

## 2021-09-23 LAB — I-STAT CREATININE (MANUAL ENTRY): Creatinine, Ser: 1.1 (ref 0.50–1.10)

## 2021-09-23 MED ORDER — IOHEXOL 300 MG/ML  SOLN
100.0000 mL | Freq: Once | INTRAMUSCULAR | Status: AC | PRN
  Administered 2021-09-23: 100 mL via INTRAVENOUS

## 2021-10-08 ENCOUNTER — Other Ambulatory Visit: Payer: PPO

## 2021-10-11 ENCOUNTER — Other Ambulatory Visit: Payer: Self-pay | Admitting: Family Medicine

## 2021-10-15 NOTE — Telephone Encounter (Signed)
Sent message to patient to confirm dosage.

## 2021-10-15 NOTE — Progress Notes (Signed)
[facility] [place] [address] [name]: [phone] Subjective:   IVilma Day, am serving as a scribe for Dr. Hulan Saas. This visit occurred during the SARS-CoV-2 public health emergency.  Safety protocols were in place, including screening questions prior to the visit, additional usage of staff PPE, and extensive cleaning of exam room while observing appropriate contact time as indicated for disinfecting solutions.   I'm seeing this patient by the request  of:  Harold Lung, MD  CC: Low back pain follow-up  ASN:[id number]  09/16/2021 Patient is complaining of fatigue by 5 PM every day.  Could be secondary to the low testosterone.  Patient has not made significant improvement but is taking the DHEA.  Patient will be referred to endocrinology for further evaluation.  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  Still has the signs and symptoms consistent with the spinal stenosis.  We will increase patient's Cymbalta to 60 mg.  See if this will help some more of the pain.  Patient did have some mild ringing in the ears and we will monitor.  Have started the allopurinol but has been not for very long time at the moment.  Do need to consider the possibility of repeating laboratory work-up in 4 weeks.  Patient wants to avoid another round of steroids secondary to the discomfort.  Patient is continuing on his tramadol that he has been given previously by another provider.  We will have patient follow-up again in 4 weeks.  Update 10/16/2021 Harold Day is a 70 y.o. male coming in with complaint of lumbar spine pain. Patient states back pain remains the same. No new complaints.  Patient states that the pain is unrelenting.  Seems to be getting worse.  Has not noticed significant improvement with the duloxetine.  Has increased it to 60 mg.  Continues to have pain on a daily basis.  Patient is frustrated and feels that underlying depression is  worsening secondary to pain.     Past Medical History:  Diagnosis Date   Alcoholic ([place])    Allergic rhinitis    Depression    Former smoker    quit 2011   GERD (gastroesophageal reflux disease)    Hepatitis C    noticed when he tried to give blood; never had illness   Past Surgical History:  Procedure Laterality Date   24 HOUR [place] STUDY N/A 09/18/2014   Procedure: 24 HOUR [place] STUDY;  Surgeon: Beryle Beams, MD;  Location: [facility];  Service: Endoscopy;  Laterality: N/A;   COLONOSCOPY     ESOPHAGEAL MANOMETRY N/A 09/18/2014   Procedure: ESOPHAGEAL MANOMETRY (EM);  Surgeon: Beryle Beams, MD;  Location: [facility];  Service: Endoscopy;  Laterality: N/A;   SHOULDER ARTHROSCOPY Left 04/2017   VASECTOMY     Social History   Socioeconomic History   Marital status: Married    Spouse name: Not on file   Number of children: 2   Years of education: Not on file   Highest education level: Not on file  Occupational History   Occupation: retired (tobacco buyer)    Employer: [employer]  Tobacco Use   Smoking status: Former   Smokeless tobacco: Current    Types: Snuff  Substance and Sexual Activity   Alcohol use: No    Comment: no alcohol since about 2000   Drug use: No   Sexual activity: Yes  Other Topics Concern   Not on file  Social History Narrative   Not on file   Social Determinants of Health   Financial Resource Strain: Not on file  Food Insecurity: Not on file  Transportation Needs: Not on file  Physical Activity: Not on file  Stress: Not on file  Social Connections: Not on file   Allergies  Allergen Reactions   Lactose Intolerance (Gi)    Family History  Problem Relation Age of Onset   Day cancer Mother    Autism Son      Current Outpatient Medications (Cardiovascular):    atorvastatin (LIPITOR) 20 MG tablet, Take 1 tablet (20 mg total) by mouth daily.   propranolol (INDERAL) 20 MG tablet, TAKE 3 TABLETS(60 MG) BY MOUTH DAILY AS  NEEDED  Current Outpatient Medications (Respiratory):    promethazine (PHENERGAN) 25 MG tablet, Take 1 tablet (25 mg total) by mouth every 6 (six) hours as needed for nausea or vomiting.   triamcinolone (NASACORT AQ) 55 MCG/ACT nasal inhaler, Place 2 sprays into the nose daily.  Current Outpatient Medications (Analgesics):    allopurinol (ZYLOPRIM) 100 MG tablet, Take 2 tablets (200 mg total) by mouth daily.   aspirin 81 MG tablet, Take 81 mg by mouth daily.   colchicine 0.6 MG tablet, Take 2 tablets (1.2 mg) by mouth x 1 dose, then 1 tablet (0.6 mg) one hour later x 1 dose.   colchicine 0.6 MG tablet, Take 1 tablet (0.6 mg total) by mouth daily.   ibuprofen (ADVIL,MOTRIN) 200 MG tablet, Take 600 mg by mouth every 4 (four) hours.   traMADol (ULTRAM) 50 MG tablet, Take 1 tablet (50 mg total) by mouth 3 (three) times daily as needed.   Current Outpatient Medications (Other):    acyclovir (ZOVIRAX) 400 MG tablet, Take 1 tablet (400 mg total) by mouth 3 (three) times daily for 7 days.   amoxicillin (AMOXIL) 875 MG tablet, Take 1 tablet (875 mg total) by mouth 2 (two) times daily.   DULoxetine (CYMBALTA) 20 MG capsule, Take 2 capsules (40 mg total) by mouth daily.   DULoxetine (CYMBALTA) 30 MG capsule, Take 1 capsule (30 mg total) by mouth daily.   esomeprazole (NEXIUM) 20 MG capsule, Take 20 mg by mouth daily at 12 noon.   fish oil-omega-3 fatty acids 1000 MG capsule, Take 2 g by mouth daily.   gabapentin (NEURONTIN) 100 MG capsule, Take 2 capsules (200 mg total) by mouth at bedtime.   loperamide (IMODIUM A-D) 2 MG tablet, Take 1 tablet (2 mg total) by mouth at bedtime as needed for diarrhea or loose stools.   Multiple Vitamins-Minerals (MULTIVITAMIN WITH MINERALS) tablet, Take 1 tablet by mouth daily.   Reviewed prior external information including notes and imaging from  primary care provider As well as notes that were available from care everywhere and other healthcare systems.  Past  medical history, social, surgical and family history all reviewed in electronic medical record.  No pertanent information unless stated regarding to the chief complaint.   Review of Systems:  No headache, visual changes, nausea, vomiting, diarrhea, constipation, dizziness, abdominal pain, skin rash, fevers, chills, night sweats, weight loss, swollen lymph nodes,  joint swelling, chest pain, shortness of breath, mood changes. POSITIVE muscle aches, body aches  Objective  Blood pressure 118/76, pulse 92, height 5\' 8"  (1.727 m), weight 173 lb (78.5 kg), SpO2 99 %.   General: No apparent distress alert and oriented x3 mood and affect normal, dressed appropriately.  Patient appears to be a moderately increase and anxious. HEENT:  Pupils equal, extraocular movements intact  Respiratory: Patient's speak in full sentences and does not appear short of breath  Cardiovascular: No lower extremity edema, non tender, no erythema  Gait normal with good balance and coordination.  MSK: Low back exam does have loss of lordosis.  Tightness with straight leg test.    Impression and Recommendations:     The above documentation has been reviewed and is accurate and complete Lyndal Pulley, DO

## 2021-10-16 ENCOUNTER — Other Ambulatory Visit: Payer: Self-pay

## 2021-10-16 ENCOUNTER — Ambulatory Visit: Payer: PPO | Admitting: Family Medicine

## 2021-10-16 DIAGNOSIS — M5416 Radiculopathy, lumbar region: Secondary | ICD-10-CM | POA: Diagnosis not present

## 2021-10-16 DIAGNOSIS — Z8739 Personal history of other diseases of the musculoskeletal system and connective tissue: Secondary | ICD-10-CM

## 2021-10-16 DIAGNOSIS — R7989 Other specified abnormal findings of blood chemistry: Secondary | ICD-10-CM

## 2021-10-16 MED ORDER — ACYCLOVIR 400 MG PO TABS: 400.0000 mg | ORAL_TABLET | Freq: Three times a day (TID) | ORAL | 0 refills | Status: AC

## 2021-10-16 NOTE — Assessment & Plan Note (Signed)
Continues to have tightness of the musculature.  Encourage patient to try the duloxetine for longer amount of time at the 60 mg daily.  May need to increase the allopurinol to 300 mg.  We will attempt the acyclovir with patient being adamant that he does feel that this is secondary to more of the COVID-vaccine.  Told him that there would be no harm in trying this medication but if no improvement happens have pretty much rules that out as a possibility.  Follow-up with me again in 6 to 8 weeks

## 2021-10-16 NOTE — Patient Instructions (Signed)
Acyclovir for next 7 days   Contin Cymbalta at 60mg  daily I do think testosterone is paying role can't wait to see endocrinology I know this is difficult but I do think well make ground here soon See you again days to a week after seeing Endocrinology

## 2021-10-16 NOTE — Assessment & Plan Note (Signed)
Continues to have low testosterone.  He is taking the DHEA required minimal improvement.  Awaiting endocrinology which I think will be beneficial as well.  Continue to monitor.  Follow-up again in 6 to 8 weeks.

## 2021-10-16 NOTE — Assessment & Plan Note (Signed)
Patient is on allopurinol.  Has not noticed any significant difference.  Patient's uric acid was 7.3 initially.  We will consider repeating at follow-up in 4 to 6 weeks.

## 2021-10-31 ENCOUNTER — Other Ambulatory Visit: Payer: Self-pay | Admitting: Family Medicine

## 2021-11-01 ENCOUNTER — Encounter: Payer: Self-pay | Admitting: Family Medicine

## 2021-11-04 MED ORDER — ACYCLOVIR 400 MG PO TABS: 400.0000 mg | ORAL_TABLET | Freq: Every day | ORAL | 1 refills | Status: DC

## 2021-11-15 ENCOUNTER — Other Ambulatory Visit: Payer: Self-pay | Admitting: Family Medicine

## 2021-11-15 ENCOUNTER — Ambulatory Visit: Payer: PPO | Admitting: Endocrinology

## 2021-11-15 ENCOUNTER — Other Ambulatory Visit: Payer: Self-pay

## 2021-11-15 ENCOUNTER — Encounter: Payer: Self-pay | Admitting: Endocrinology

## 2021-11-15 VITALS — BP 150/72 | HR 84 | Ht 69.0 in | Wt 174.6 lb

## 2021-11-15 DIAGNOSIS — R7989 Other specified abnormal findings of blood chemistry: Secondary | ICD-10-CM | POA: Diagnosis not present

## 2021-11-15 DIAGNOSIS — Z125 Encounter for screening for malignant neoplasm of prostate: Secondary | ICD-10-CM | POA: Diagnosis not present

## 2021-11-15 LAB — BASIC METABOLIC PANEL
BUN: 12 mg/dL (ref 6–23)
CO2: 26 mEq/L (ref 19–32)
Calcium: 9.6 mg/dL (ref 8.4–10.5)
Chloride: 105 mEq/L (ref 96–112)
Creatinine, Ser: 1.22 mg/dL (ref 0.40–1.50)
GFR: 60.06 mL/min (ref >60.00)
Glucose, Bld: 111 mg/dL — ABNORMAL HIGH (ref 70–99)
Potassium: 4.2 mEq/L (ref 3.5–5.1)
Sodium: 139 mEq/L (ref 135–145)

## 2021-11-15 LAB — PSA: PSA: 2.1 ng/mL (ref 0.10–4.00)

## 2021-11-15 LAB — FOLLICLE STIMULATING HORMONE: FSH: 5.2 m[IU]/mL (ref 1.4–18.1)

## 2021-11-15 LAB — LUTEINIZING HORMONE: LH: 3.2 m[IU]/mL (ref 3.10–34.60)

## 2021-11-15 NOTE — Progress Notes (Signed)
Subjective:    Patient ID: Harold Day, male    DOB: Dec 09, 1950, 71 y.o.   MRN: [id number]  HPI Pt is referred by Dr Tamala Julian, for low testosterone level.  Pt reports he had puberty at the normal age.  He has 2 biological children.  He says he has never taken illicit androgens.  He has never had pituitary imaging. He has never been on any prescribed medication for hypogonadism.  He does not take antiandrogens or opioids.  He denies any h/o infertility, XRT, or genital infection.  He has never had surgery, or a serious injury to the head or genital area. He has no h/o sleep apnea or DVT.  No EtOH intake since 2012.  He was noted to have low testosterone in 2022. He reports decreased libido and weight gain.   Past Medical History:  Diagnosis Date   Alcoholic ([place])    Allergic rhinitis    Depression    Former smoker    quit 2011   GERD (gastroesophageal reflux disease)    Hepatitis C    noticed when he tried to give blood; never had illness    Past Surgical History:  Procedure Laterality Date   24 HOUR [place] STUDY N/A 09/18/2014   Procedure: 24 HOUR [place] STUDY;  Surgeon: Beryle Beams, MD;  [facility]: [facility];  Service: Endoscopy;  Laterality: N/A;   COLONOSCOPY     ESOPHAGEAL MANOMETRY N/A 09/18/2014   Procedure: ESOPHAGEAL MANOMETRY (EM);  Surgeon: Beryle Beams, MD;  Location: [facility];  Service: Endoscopy;  Laterality: N/A;   SHOULDER ARTHROSCOPY Left 04/2017   VASECTOMY      Social History   Socioeconomic History   Marital status: Married    Spouse name: Not on file   Number of children: 2   Years of education: Not on file   Highest education level: Not on file  Occupational History   Occupation: retired (tobacco buyer)    Employer: [employer]  Tobacco Use   Smoking status: Former   Smokeless tobacco: Current    Types: Snuff  Substance and Sexual Activity   Alcohol use: No    Comment: no alcohol since about 2000   Drug use: No   Sexual activity:  Yes  Other Topics Concern   Not on file  Social History Narrative   Not on file   Social Determinants of Health   Financial Resource Strain: Not on file  Food Insecurity: Not on file  Transportation Needs: Not on file  Physical Activity: Not on file  Stress: Not on file  Social Connections: Not on file  Intimate Partner Violence: Not on file    Current Outpatient Medications on File Prior to Visit  Medication Sig Dispense Refill   acyclovir (ZOVIRAX) 400 MG tablet Take 1 tablet (400 mg total) by mouth daily. 90 tablet 1   allopurinol (ZYLOPRIM) 100 MG tablet Take 2 tablets (200 mg total) by mouth daily. 180 tablet 0   aspirin 81 MG tablet Take 81 mg by mouth daily.     colchicine 0.6 MG tablet Take 2 tablets (1.2 mg) by mouth x 1 dose, then 1 tablet (0.6 mg) one hour later x 1 dose. 20 tablet 0   colchicine 0.6 MG tablet Take 1 tablet (0.6 mg total) by mouth daily. 5 tablet 0   DULoxetine (CYMBALTA) 20 MG capsule Take 2 capsules (40 mg total) by mouth daily. 60 capsule 0   DULoxetine (CYMBALTA) 30 MG capsule TAKE  1 CAPSULE(30 MG) BY MOUTH DAILY 60 capsule 0   esomeprazole (NEXIUM) 20 MG capsule Take 20 mg by mouth daily at 12 noon.     fish oil-omega-3 fatty acids 1000 MG capsule Take 2 g by mouth daily.     ibuprofen (ADVIL,MOTRIN) 200 MG tablet Take 600 mg by mouth every 4 (four) hours.     loperamide (IMODIUM A-D) 2 MG tablet Take 1 tablet (2 mg total) by mouth at bedtime as needed for diarrhea or loose stools. 12 tablet 0   Multiple Vitamins-Minerals (MULTIVITAMIN WITH MINERALS) tablet Take 1 tablet by mouth daily.     promethazine (PHENERGAN) 25 MG tablet Take 1 tablet (25 mg total) by mouth every 6 (six) hours as needed for nausea or vomiting. 20 tablet 0   propranolol (INDERAL) 20 MG tablet TAKE 3 TABLETS(60 MG) BY MOUTH DAILY AS NEEDED 270 tablet 0   traMADol (ULTRAM) 50 MG tablet Take 1 tablet (50 mg total) by mouth 3 (three) times daily as needed. 30 tablet 0    triamcinolone (NASACORT AQ) 55 MCG/ACT nasal inhaler Place 2 sprays into the nose daily. 1 Inhaler 12   amoxicillin (AMOXIL) 875 MG tablet Take 1 tablet (875 mg total) by mouth 2 (two) times daily. 20 tablet 0   gabapentin (NEURONTIN) 100 MG capsule Take 2 capsules (200 mg total) by mouth at bedtime. 180 capsule 0   No current facility-administered medications on file prior to visit.    Allergies  Allergen Reactions   Lactose Intolerance (Gi)     Family History  Problem Relation Age of Onset   Lung cancer Mother    Autism Son    Other Neg Hx     BP (!) 150/72    Pulse 84    Ht 5\' 9"  (1.753 m)    Wt 174 lb 9.6 oz (79.2 kg)    SpO2 96%    BMI 25.78 kg/m    Review of Systems denies numbness, erectile dysfunction, gynecomastia, muscle weakness, and headache.      Objective:   Physical Exam VS: see vs page GEN: no distress HEAD: head: no deformity eyes: no periorbital swelling, no proptosis external nose and ears are normal NECK: supple, thyroid is not enlarged CHEST WALL: no deformity LUNGS: clear to auscultation BREASTS:  No gynecomastia.   CV: reg rate and rhythm, no murmur GENITALIA:  Normal male.   MUSCULOSKELETAL: muscle bulk and strength are grossly normal.  no joint swelling is seen  gait is normal and steady EXTEMITIES: no leg edema NEURO: sensation is intact to touch on all 4's SKIN:  Normal texture and temperature.  No rash or suspicious lesion is visible.  Normal male hair distribution. NODES:  None palpable at the neck PSYCH: alert, well-oriented.  Does not appear anxious nor depressed.     Lab Results  Component Value Date   TSH 1.21 08/28/2020   Lab Results  Component Value Date   TESTOSTERONE 171.12 (L) 09/05/2021   Another level was 111  Lab Results  Component Value Date   WBC 11.8 (H) 07/10/2021   HGB 14.2 07/10/2021   HCT 42.5 07/10/2021   MCV 92.9 07/10/2021   PLT 271.0 07/10/2021   I have reviewed outside records, and summarized: Pt  was noted to have low testosterone, and referred here.  This was felt to be poss related to MSK sxs.     Assessment & Plan:  Low testosterone, uncertain etiology and prognosis.    Patient Instructions  Blood tests are requested for you today.  We'll let you know about the results.   Based on the results, you may need an MRI.   Then I hope to be able to prescribe for you a pill to increase the testosterone.   Testosterone treatment has risks, including increased or decreased fertility (depending on the type of treatment), hair loss, prostate cancer, benign prostate enlargement, blood clots, liver problems, lower hdl ("good cholesterol"), polycythemia (opposite of anemia), sleep apnea, and behavior changes.   Weight loss also helps the testosterone.

## 2021-11-15 NOTE — Patient Instructions (Addendum)
Blood tests are requested for you today.  We'll let you know about the results.   Based on the results, you may need an MRI.   Then I hope to be able to prescribe for you a pill to increase the testosterone.   Testosterone treatment has risks, including increased or decreased fertility (depending on the type of treatment), hair loss, prostate cancer, benign prostate enlargement, blood clots, liver problems, lower hdl ("good cholesterol"), polycythemia (opposite of anemia), sleep apnea, and behavior changes.   Weight loss also helps the testosterone.

## 2021-11-16 LAB — PROLACTIN: Prolactin: 5.3 ng/mL (ref 2.0–18.0)

## 2021-11-18 NOTE — Progress Notes (Signed)
[place] Phone: [phone] Subjective:   [name], am serving as a scribe for Dr. Hulan Saas. This visit occurred during the SARS-CoV-2 public health emergency.  Safety protocols were in place, including screening questions prior to the visit, additional usage of staff PPE, and extensive cleaning of exam room while observing appropriate contact time as indicated for disinfecting solutions.   I'm seeing this patient by the request  of:  Denita Lung, MD  CC: Multiple complaint follow-up  [id number]  10/16/2021 Continues to have tightness of the musculature.  Encourage patient to try the duloxetine for longer amount of time at the 60 mg daily.  May need to increase the allopurinol to 300 mg.  We will attempt the acyclovir with patient being adamant that he does feel that this is secondary to more of the COVID-vaccine.  Told him that there would be no harm in trying this medication but if no improvement happens have pretty much rules that out as a possibility.  Follow-up with me again in 6 to 8 weeks  Acyclovir for next 7 days    Contin Cymbalta at 60mg  daily I do think testosterone is paying role can't wait to see endocrinology I know this is difficult but I do think well make ground here soon See you again days to a week after seeing Endocrinology    Update 11/19/2020 AMEER SANDEN is a 71 y.o. male coming in with complaint of LBP. Patient say Dr. Loanne Drilling since last visit. Patient states that he continues to have pain. No change since last visit.    Patient is endocrinology and most recent laboratory test that showed that patient continues to have low testosterone with normal LH FSH.  Patient was feeling better but with the acyclovir at any time it seems to discontinue or go down to 400 mg daily starts having more exacerbation of some of the pain.      Past Medical History:  Diagnosis Date    Alcoholic ([place])    Allergic rhinitis    Depression    Former smoker    quit 2011   GERD (gastroesophageal reflux disease)    Hepatitis C    noticed when he tried to give blood; never had illness   Past Surgical History:  Procedure Laterality Date   24 HOUR [place] STUDY N/A 09/18/2014   Procedure: 24 HOUR [place] STUDY;  Surgeon: Beryle Beams, MD;  [facility];  Service: Endoscopy;  Laterality: N/A;   COLONOSCOPY     ESOPHAGEAL MANOMETRY N/A 09/18/2014   Procedure: ESOPHAGEAL MANOMETRY (EM);  Surgeon: Beryle Beams, MD;  [facility];  Service: Endoscopy;  Laterality: N/A;   SHOULDER ARTHROSCOPY Left 04/2017   VASECTOMY     Social History   Socioeconomic History   Marital status: Married    Spouse name: Not on file   Number of children: 2   Years of education: Not on file   Highest education level: Not on file  Occupational History   Occupation: retired (tobacco buyer)    Employer: [employer]  Tobacco Use   Smoking status: Former   Smokeless tobacco: Current    Types: Snuff  Substance and Sexual Activity   Alcohol use: No    Comment: no alcohol since about 2000   Drug use: No   Sexual activity: Yes  Other Topics Concern   Not on file  Social History Narrative  Not on file   Social Determinants of Health   Financial Resource Strain: Not on file  Food Insecurity: Not on file  Transportation Needs: Not on file  Physical Activity: Not on file  Stress: Not on file  Social Connections: Not on file   Allergies  Allergen Reactions   Lactose Intolerance (Gi)    Family History  Problem Relation Age of Onset   Lung cancer Mother    Autism Son    Other Neg Hx      Current Outpatient Medications (Cardiovascular):    atorvastatin (LIPITOR) 20 MG tablet, TAKE 1 TABLET(20 MG) BY MOUTH DAILY   propranolol (INDERAL) 20 MG tablet, TAKE 3 TABLETS(60 MG) BY MOUTH DAILY AS NEEDED  Current Outpatient Medications (Respiratory):     promethazine (PHENERGAN) 25 MG tablet, Take 1 tablet (25 mg total) by mouth every 6 (six) hours as needed for nausea or vomiting.   triamcinolone (NASACORT AQ) 55 MCG/ACT nasal inhaler, Place 2 sprays into the nose daily.  Current Outpatient Medications (Analgesics):    allopurinol (ZYLOPRIM) 300 MG tablet, Take 1 tablet (300 mg total) by mouth daily.   aspirin 81 MG tablet, Take 81 mg by mouth daily.   colchicine 0.6 MG tablet, Take 2 tablets (1.2 mg) by mouth x 1 dose, then 1 tablet (0.6 mg) one hour later x 1 dose.   colchicine 0.6 MG tablet, Take 1 tablet (0.6 mg total) by mouth daily.   ibuprofen (ADVIL,MOTRIN) 200 MG tablet, Take 600 mg by mouth every 4 (four) hours.   traMADol (ULTRAM) 50 MG tablet, Take 1 tablet (50 mg total) by mouth 3 (three) times daily as needed.   Current Outpatient Medications (Other):    acyclovir (ZOVIRAX) 400 MG tablet, Take 1 tablet (400 mg total) by mouth daily.   DULoxetine (CYMBALTA) 60 MG capsule, Take 1 capsule (60 mg total) by mouth daily.   esomeprazole (NEXIUM) 20 MG capsule, Take 20 mg by mouth daily at 12 noon.   fish oil-omega-3 fatty acids 1000 MG capsule, Take 2 g by mouth daily.   loperamide (IMODIUM A-D) 2 MG tablet, Take 1 tablet (2 mg total) by mouth at bedtime as needed for diarrhea or loose stools.   Multiple Vitamins-Minerals (MULTIVITAMIN WITH MINERALS) tablet, Take 1 tablet by mouth daily.   amoxicillin (AMOXIL) 875 MG tablet, Take 1 tablet (875 mg total) by mouth 2 (two) times daily.   gabapentin (NEURONTIN) 100 MG capsule, Take 2 capsules (200 mg total) by mouth at bedtime.   Reviewed prior external information including notes and imaging from  primary care provider As well as notes that were available from care everywhere and other healthcare systems.  Past medical history, social, surgical and family history all reviewed in electronic medical record.  No pertanent information unless stated regarding to the chief complaint.    Review of Systems:  No headache, visual changes, nausea, vomiting, diarrhea, constipation, dizziness, abdominal pain, skin rash, fevers, chills, night sweats, weight loss, swollen lymph nodes, joint swelling, chest pain, shortness of breath, mood changes. POSITIVE muscle aches, body aches, fatigue  Objective  Blood pressure 110/78, pulse 81, height 5\' 9"  (1.753 m), weight 176 lb (79.8 kg), SpO2 97 %.   General: No apparent distress alert and oriented x3 mood and affect normal, dressed appropriately.  HEENT: Pupils equal, extraocular movements intact  Respiratory: Patient's speak in full sentences and does not appear short of breath  Cardiovascular: No lower extremity edema, non tender, no erythema  Gait normal  with good balance and coordination.  MSK: Patient does have some very mild loss of strength of the peripheral extremities. Patient does have just soreness diffusely in multiple different body parts.   Impression and Recommendations:     The above documentation has been reviewed and is accurate and complete Lyndal Pulley, DO

## 2021-11-19 ENCOUNTER — Ambulatory Visit: Payer: PPO | Admitting: Family Medicine

## 2021-11-19 ENCOUNTER — Other Ambulatory Visit: Payer: Self-pay

## 2021-11-19 ENCOUNTER — Encounter: Payer: Self-pay | Admitting: Family Medicine

## 2021-11-19 DIAGNOSIS — Z8739 Personal history of other diseases of the musculoskeletal system and connective tissue: Secondary | ICD-10-CM

## 2021-11-19 DIAGNOSIS — R7989 Other specified abnormal findings of blood chemistry: Secondary | ICD-10-CM

## 2021-11-19 DIAGNOSIS — F341 Dysthymic disorder: Secondary | ICD-10-CM

## 2021-11-19 MED ORDER — DULOXETINE HCL 60 MG PO CPEP: 60.0000 mg | ORAL_CAPSULE | Freq: Every day | ORAL | 0 refills | Status: DC

## 2021-11-19 MED ORDER — ALLOPURINOL 300 MG PO TABS: 300.0000 mg | ORAL_TABLET | Freq: Every day | ORAL | 0 refills | Status: DC

## 2021-11-19 NOTE — Assessment & Plan Note (Signed)
Increase patient Cymbalta to 60 mg.  Patient wants to take the tablets that he has at home and then will get the refill.  Hopefully 60 mg will help with some of this as well as some of the chronic pain that could be contributing.

## 2021-11-19 NOTE — Assessment & Plan Note (Signed)
We will increase patient's allopurinol to 300 mg.  Patient still had a uric acid a little elevated at 7.4.  GFR is above 60.  See if this helps with some of the discomfort and pain as well.

## 2021-11-19 NOTE — Assessment & Plan Note (Signed)
Patient does have discomfort noted and significant fatigue.  Continues to have low testosterone in the 100s.  Recently did see endocrinology and is awaiting their recommendations on medications.

## 2021-11-22 ENCOUNTER — Ambulatory Visit (INDEPENDENT_AMBULATORY_CARE_PROVIDER_SITE_OTHER): Payer: PPO

## 2021-11-22 ENCOUNTER — Telehealth: Payer: Self-pay

## 2021-11-22 VITALS — Ht 69.0 in | Wt 172.0 lb

## 2021-11-22 DIAGNOSIS — Z0000 Encounter for general adult medical examination without abnormal findings: Secondary | ICD-10-CM

## 2021-11-22 LAB — TESTOSTERONE,FREE AND TOTAL
Testosterone, Free: 8.9 pg/mL (ref 6.6–18.1)
Testosterone: 181 ng/dL — ABNORMAL LOW (ref 264–916)

## 2021-11-22 NOTE — Telephone Encounter (Signed)
Pt LVM stating that you mentioned that he may need an MRI for low testosterone but his other Dr Herminio Heads stated that he does not need one and it would be ok for him to go ahead an start the pill. Pt would like for you to go ahead and send in the script.  Please Advise

## 2021-11-22 NOTE — Patient Instructions (Signed)
Harold Day , Thank you for taking time to come for your Medicare Wellness Visit. I appreciate your ongoing commitment to your health goals. Please review the following plan we discussed and let me know if I can assist you in the future.   Screening recommendations/referrals: Colonoscopy: completed 06/17/2018, due 06/17/2028 Recommended yearly ophthalmology/optometry visit for glaucoma screening and checkup Recommended yearly dental visit for hygiene and checkup  Vaccinations: Influenza vaccine: due now Pneumococcal vaccine: completed 08/20/2017 Tdap vaccine: completed 07/17/2016, due 07/17/2026 Shingles vaccine: completed   Covid-19:  09/16/2020, 12/25/2019, 12/03/2019  Advanced directives: Please bring a copy of your POA (Power of Attorney) and/or Living Will to your next appointment.   Conditions/risks identified: uses snuff  Next appointment: Follow up in one year for your annual wellness visit.   Preventive Care 11 Years and Older, Male Preventive care refers to lifestyle choices and visits with your health care provider that can promote health and wellness. What does preventive care include? A yearly physical exam. This is also called an annual well check. Dental exams once or twice a year. Routine eye exams. Ask your health care provider how often you should have your eyes checked. Personal lifestyle choices, including: Daily care of your teeth and gums. Regular physical activity. Eating a healthy diet. Avoiding tobacco and drug use. Limiting alcohol use. Practicing safe sex. Taking low doses of aspirin every day. Taking vitamin and mineral supplements as recommended by your health care provider. What happens during an annual well check? The services and screenings done by your health care provider during your annual well check will depend on your age, overall health, lifestyle risk factors, and family history of disease. Counseling  Your health care provider may ask you  questions about your: Alcohol use. Tobacco use. Drug use. Emotional well-being. Home and relationship well-being. Sexual activity. Eating habits. History of falls. Memory and ability to understand (cognition). Work and work Statistician. Screening  You may have the following tests or measurements: Height, weight, and BMI. Blood pressure. Lipid and cholesterol levels. These may be checked every 5 years, or more frequently if you are over 65 years old. Skin check. Lung cancer screening. You may have this screening every year starting at age 74 if you have a 30-pack-year history of smoking and currently smoke or have quit within the past 15 years. Fecal occult blood test (FOBT) of the stool. You may have this test every year starting at age 53. Flexible sigmoidoscopy or colonoscopy. You may have a sigmoidoscopy every 5 years or a colonoscopy every 10 years starting at age 74. Prostate cancer screening. Recommendations will vary depending on your family history and other risks. Hepatitis C blood test. Hepatitis B blood test. Sexually transmitted disease (STD) testing. Diabetes screening. This is done by checking your blood sugar (glucose) after you have not eaten for a while (fasting). You may have this done every 1-3 years. Abdominal aortic aneurysm (AAA) screening. You may need this if you are a current or former smoker. Osteoporosis. You may be screened starting at age 52 if you are at high risk. Talk with your health care provider about your test results, treatment options, and if necessary, the need for more tests. Vaccines  Your health care provider may recommend certain vaccines, such as: Influenza vaccine. This is recommended every year. Tetanus, diphtheria, and acellular pertussis (Tdap, Td) vaccine. You may need a Td booster every 10 years. Zoster vaccine. You may need this after age 66. Pneumococcal 13-valent conjugate (PCV13) vaccine. One  dose is recommended after age  64. Pneumococcal polysaccharide (PPSV23) vaccine. One dose is recommended after age 44. Talk to your health care provider about which screenings and vaccines you need and how often you need them. This information is not intended to replace advice given to you by your health care provider. Make sure you discuss any questions you have with your health care provider. Document Released: 11/02/2015 Document Revised: 06/25/2016 Document Reviewed: 08/07/2015 Elsevier Interactive Patient Education  2017 [place] Prevention in the Home Falls can cause injuries. They can happen to people of all ages. There are many things you can do to make your home safe and to help prevent falls. What can I do on the outside of my home? Regularly fix the edges of walkways and driveways and fix any cracks. Remove anything that might make you trip as you walk through a door, such as a raised step or threshold. Trim any bushes or trees on the path to your home. Use bright outdoor lighting. Clear any walking paths of anything that might make someone trip, such as rocks or tools. Regularly check to see if handrails are loose or broken. Make sure that both sides of any steps have handrails. Any raised decks and porches should have guardrails on the edges. Have any leaves, snow, or ice cleared regularly. Use sand or salt on walking paths during winter. Clean up any spills in your garage right away. This includes oil or grease spills. What can I do in the bathroom? Use night lights. Install grab bars by the toilet and in the tub and shower. Do not use towel bars as grab bars. Use non-skid mats or decals in the tub or shower. If you need to sit down in the shower, use a plastic, non-slip stool. Keep the floor dry. Clean up any water that spills on the floor as soon as it happens. Remove soap buildup in the tub or shower regularly. Attach bath mats securely with double-sided non-slip rug tape. Do not have throw  rugs and other things on the floor that can make you trip. What can I do in the bedroom? Use night lights. Make sure that you have a light by your bed that is easy to reach. Do not use any sheets or blankets that are too big for your bed. They should not hang down onto the floor. Have a firm chair that has side arms. You can use this for support while you get dressed. Do not have throw rugs and other things on the floor that can make you trip. What can I do in the kitchen? Clean up any spills right away. Avoid walking on wet floors. Keep items that you use a lot in easy-to-reach places. If you need to reach something above you, use a strong step stool that has a grab bar. Keep electrical cords out of the way. Do not use floor polish or wax that makes floors slippery. If you must use wax, use non-skid floor wax. Do not have throw rugs and other things on the floor that can make you trip. What can I do with my stairs? Do not leave any items on the stairs. Make sure that there are handrails on both sides of the stairs and use them. Fix handrails that are broken or loose. Make sure that handrails are as long as the stairways. Check any carpeting to make sure that it is firmly attached to the stairs. Fix any carpet that is loose or worn.  Avoid having throw rugs at the top or bottom of the stairs. If you do have throw rugs, attach them to the floor with carpet tape. Make sure that you have a light switch at the top of the stairs and the bottom of the stairs. If you do not have them, ask someone to add them for you. What else can I do to help prevent falls? Wear shoes that: Do not have high heels. Have rubber bottoms. Are comfortable and fit you well. Are closed at the toe. Do not wear sandals. If you use a stepladder: Make sure that it is fully opened. Do not climb a closed stepladder. Make sure that both sides of the stepladder are locked into place. Ask someone to hold it for you, if  possible. Clearly mark and make sure that you can see: Any grab bars or handrails. First and last steps. Where the edge of each step is. Use tools that help you move around (mobility aids) if they are needed. These include: Canes. Walkers. Scooters. Crutches. Turn on the lights when you go into a dark area. Replace any light bulbs as soon as they burn out. Set up your furniture so you have a clear path. Avoid moving your furniture around. If any of your floors are uneven, fix them. If there are any pets around you, be aware of where they are. Review your medicines with your doctor. Some medicines can make you feel dizzy. This can increase your chance of falling. Ask your doctor what other things that you can do to help prevent falls. This information is not intended to replace advice given to you by your health care provider. Make sure you discuss any questions you have with your health care provider. Document Released: 08/02/2009 Document Revised: 03/13/2016 Document Reviewed: 11/10/2014 Elsevier Interactive Patient Education  2017 [employer].

## 2021-11-22 NOTE — Progress Notes (Signed)
I connected with Harold Day today by telephone and verified that I am speaking with the correct person using two identifiers. Location patient: home Location provider: work Persons participating in the virtual visit: Harold Day, Glenna Durand LPN.   I discussed the limitations, risks, security and privacy concerns of performing an evaluation and management service by telephone and the availability of in person appointments. I also discussed with the patient that there may be a patient responsible charge related to this service. The patient expressed understanding and verbally consented to this telephonic visit.    Interactive audio and video telecommunications were attempted between this provider and patient, however failed, due to patient having technical difficulties OR patient did not have access to video capability.  We continued and completed visit with audio only.     Vital signs may be patient reported or missing.  Subjective:   Harold Day is a 71 y.o. male who presents for Medicare Annual/Subsequent preventive examination.  Review of Systems     Cardiac Risk Factors include: advanced age (>47men, >82 women);male gender;smoking/ tobacco exposure     Objective:    Today's Vitals   11/22/21 0811 11/22/21 0812  Weight: 172 lb (78 kg)   Height: 5\' 9"  (1.753 m)   PainSc:  7    Body mass index is 25.4 kg/m.  Advanced Directives 11/22/2021 03/13/2021 11/28/2020 08/28/2020 11/28/2019 09/23/2019 10/06/2018  Does Patient Have a Medical Advance Directive? Yes Yes Yes Yes No Yes Yes  Type of Paramedic of [place];Living will [place];Living will Living will;Healthcare Power of [place];Living will - - [place];Living will  Does patient want to make changes to medical advance directive? - No - Patient declined - - - - Yes (MAU/Ambulatory/Procedural Areas - Information given)  Copy of  [place] in Chart? No - copy requested No - copy requested No - copy requested - - - No - copy requested  Would patient like information on creating a medical advance directive? - - - - Yes (MAU/Ambulatory/Procedural Areas - Information given) - -    Current Medications (verified) Outpatient Encounter Medications as of 11/22/2021  Medication Sig   acyclovir (ZOVIRAX) 400 MG tablet Take 1 tablet (400 mg total) by mouth daily.   allopurinol (ZYLOPRIM) 300 MG tablet Take 1 tablet (300 mg total) by mouth daily.   aspirin 81 MG tablet Take 81 mg by mouth daily.   atorvastatin (LIPITOR) 20 MG tablet TAKE 1 TABLET(20 MG) BY MOUTH DAILY   colchicine 0.6 MG tablet Take 2 tablets (1.2 mg) by mouth x 1 dose, then 1 tablet (0.6 mg) one hour later x 1 dose.   colchicine 0.6 MG tablet Take 1 tablet (0.6 mg total) by mouth daily.   DULoxetine (CYMBALTA) 60 MG capsule Take 1 capsule (60 mg total) by mouth daily.   esomeprazole (NEXIUM) 20 MG capsule Take 20 mg by mouth daily at 12 noon.   fish oil-omega-3 fatty acids 1000 MG capsule Take 2 g by mouth daily.   ibuprofen (ADVIL,MOTRIN) 200 MG tablet Take 600 mg by mouth every 4 (four) hours.   loperamide (IMODIUM A-D) 2 MG tablet Take 1 tablet (2 mg total) by mouth at bedtime as needed for diarrhea or loose stools.   Multiple Vitamins-Minerals (MULTIVITAMIN WITH MINERALS) tablet Take 1 tablet by mouth daily.   promethazine (PHENERGAN) 25 MG tablet Take 1 tablet (25 mg total) by mouth every 6 (six) hours  as needed for nausea or vomiting.   propranolol (INDERAL) 20 MG tablet TAKE 3 TABLETS(60 MG) BY MOUTH DAILY AS NEEDED   traMADol (ULTRAM) 50 MG tablet Take 1 tablet (50 mg total) by mouth 3 (three) times daily as needed.   triamcinolone (NASACORT AQ) 55 MCG/ACT nasal inhaler Place 2 sprays into the nose daily.   amoxicillin (AMOXIL) 875 MG tablet Take 1 tablet (875 mg total) by mouth 2 (two) times daily.   gabapentin (NEURONTIN) 100 MG capsule  Take 2 capsules (200 mg total) by mouth at bedtime.   No facility-administered encounter medications on file as of 11/22/2021.    Allergies (verified) Lactose intolerance (gi)   History: Past Medical History:  Diagnosis Date   Alcoholic ([place])    Allergic rhinitis    Depression    Former smoker    quit 2011   GERD (gastroesophageal reflux disease)    Hepatitis C    noticed when he tried to give blood; never had illness   Past Surgical History:  Procedure Laterality Date   24 HOUR [place] STUDY N/A 09/18/2014   Procedure: 24 HOUR [place] STUDY;  Surgeon: Beryle Beams, MD;  Location: [facility];  Service: Endoscopy;  Laterality: N/A;   COLONOSCOPY     ESOPHAGEAL MANOMETRY N/A 09/18/2014   Procedure: ESOPHAGEAL MANOMETRY (EM);  Surgeon: Beryle Beams, MD;  Location: [facility];  Service: Endoscopy;  Laterality: N/A;   SHOULDER ARTHROSCOPY Left 04/2017   VASECTOMY     Family History  Problem Relation Age of Onset   Lung cancer Mother    Autism Son    Other Neg Hx    Social History   Socioeconomic History   Marital status: Married    Spouse name: Not on file   Number of children: 2   Years of education: Not on file   Highest education level: Not on file  Occupational History   Occupation: retired (tobacco buyer)    Employer: [employer]  Tobacco Use   Smoking status: Former   Smokeless tobacco: Current    Types: Snuff  Vaping Use   Vaping Use: Former  Substance and Sexual Activity   Alcohol use: No    Comment: no alcohol since about 2000   Drug use: No   Sexual activity: Yes  Other Topics Concern   Not on file  Social History Narrative   Not on file   Social Determinants of Health   Financial Resource Strain: Low Risk    Difficulty of Paying Living Expenses: Not hard at all  Food Insecurity: No Food Insecurity   Worried About Charity fundraiser in the Last Year: Never true   [place] in the Last Year: Never true  Transportation Needs: No  Transportation Needs   Lack of Transportation (Medical): No   Lack of Transportation (Non-Medical): No  Physical Activity: Insufficiently Active   Days of Exercise per Week: 4 days   Minutes of Exercise per Session: 20 min  Stress: No Stress Concern Present   Feeling of Stress : Not at all  Social Connections: Not on file    Tobacco Counseling Ready to quit: Not Answered Counseling given: Not Answered   Clinical Intake:  Pre-visit preparation completed: Yes  Pain : 0-10 Pain Score: 7  Pain Type: Chronic pain Pain Location: Groin (thigh) Pain Orientation: Left Pain Descriptors / Indicators: Pins and needles Pain Onset: More than a month ago Pain Frequency: Constant     Nutritional  Status: BMI 25 -29 Overweight Nutritional Risks: None Diabetes: No  How often do you need to have someone help you when you read instructions, pamphlets, or other written materials from your doctor or pharmacy?: 1 - Never What is the last grade level you completed in school?: 33yrs college  Diabetic? no  Interpreter Needed?: No  Information entered by :: NAllen LPN   Activities of Daily Living In your present state of health, do you have any difficulty performing the following activities: 11/22/2021 11/28/2020  Hearing? Tempie Donning  Vision? N N  Difficulty concentrating or making decisions? N N  Walking or climbing stairs? N N  Dressing or bathing? N N  Doing errands, shopping? N N  Preparing Food and eating ? N N  Using the Toilet? N N  In the past six months, have you accidently leaked urine? N N  Do you have problems with loss of bowel control? N N  Managing your Medications? N N  Managing your Finances? N N  Housekeeping or managing your Housekeeping? N N  Some recent data might be hidden    Patient Care Team: Denita Lung, MD as PCP - General  Indicate any recent Medical Services you may have received from other than [facility] providers in the past year (date may be approximate).      Assessment:   This is a routine wellness examination for [place].  Hearing/Vision screen Vision Screening - Comments:: Regular eye exams, [facility]  Dietary issues and exercise activities discussed: Current Exercise Habits: Home exercise routine, Type of exercise: Other - see comments (ski machine), Time (Minutes): 20, Frequency (Times/Week): 4, Weekly Exercise (Minutes/Week): 80   Goals Addressed             This Visit's Progress    Patient Stated       11/22/2021, get rid of the pain       Depression Screen PHQ 2/9 Scores 11/22/2021 02/22/2021 11/28/2020 08/06/2020 06/05/2020 11/28/2019 10/06/2018  PHQ - 2 Score 3 0 0 0 0 0 0  PHQ- 9 Score 9 0 - - - - -    Fall Risk Fall Risk  11/22/2021 02/22/2021 11/28/2020 08/28/2020 11/28/2019  Falls in the past year? 0 0 0 1 0  Comment - - - - -  Number falls in past yr: - 0 - 0 -  Injury with Fall? - 0 - 0 -  Risk for fall due to : Medication side effect No Fall Risks - - -  Follow up Falls evaluation completed;Education provided;Falls prevention discussed Falls evaluation completed - - -    FALL RISK PREVENTION PERTAINING TO THE HOME:  Any stairs in or around the home? Yes  If so, are there any without handrails? No  Home free of loose throw rugs in walkways, pet beds, electrical cords, etc? Yes  Adequate lighting in your home to reduce risk of falls? Yes   ASSISTIVE DEVICES UTILIZED TO PREVENT FALLS:  Life alert? No  Use of a cane, walker or w/c? No  Grab bars in the bathroom? No  Shower chair or bench in shower? Yes  Elevated toilet seat or a handicapped toilet? Yes   TIMED UP AND GO:  Was the test performed? No .      Cognitive Function: MMSE - Mini Mental State Exam 08/06/2020  Orientation to time 4  Orientation to Place 5  Registration 3  Attention/ Calculation 5  Recall 3  Language- name 2 objects 2  Language- repeat 1  Language- follow 3 step command 3  Language- read & follow direction 1  Write a sentence 0  Copy  design 1  Total score 28     6CIT Screen 11/22/2021  What Year? 0 points  What month? 0 points  What time? 0 points  Count back from 20 0 points  Months in reverse 0 points  Repeat phrase 0 points  Total Score 0    Immunizations Immunization History  Administered Date(s) Administered   DT (Pediatric) 02/01/1996   Hepatitis A, Adult 07/05/2015, 08/06/2015   Influenza Split 09/08/2002, 07/23/2006   Influenza, High Dose Seasonal PF 07/17/2016, 08/20/2017   Influenza, Quadrivalent, Recombinant, Inj, Pf 07/07/2019   Influenza,inj,Quad PF,6+ Mos 07/05/2015, 08/21/2018   PFIZER(Purple Top)SARS-COV-2 Vaccination 12/03/2019, 12/25/2019, 09/16/2020   Pneumococcal Conjugate-13 07/17/2016   Pneumococcal Polysaccharide-23 08/20/2017   Td 07/23/2006   Tdap 07/17/2016   Zoster Recombinat (Shingrix) 08/25/2017, 02/06/2018   Zoster, Live 03/08/2012    TDAP status: Up to date  Flu Vaccine status: Due, Education has been provided regarding the importance of this vaccine. Advised may receive this vaccine at local pharmacy or Health Dept. Aware to provide a copy of the vaccination record if obtained from local pharmacy or Health Dept. Verbalized acceptance and understanding.  Pneumococcal vaccine status: Up to date  Covid-19 vaccine status: Completed vaccines  Qualifies for Shingles Vaccine? Yes   Zostavax completed Yes   Shingrix Completed?: Yes  Screening Tests Health Maintenance  Topic Date Due   COVID-19 Vaccine (4 - Booster for Pfizer series) 11/11/2020   INFLUENZA VACCINE  05/20/2021   TETANUS/TDAP  07/17/2026   COLONOSCOPY (Pts 45-66yrs Insurance coverage will need to be confirmed)  06/17/2028   Pneumonia Vaccine 51+ Years old  Completed   Hepatitis C Screening  Completed   Zoster Vaccines- Shingrix  Completed   HPV VACCINES  Aged Out    Health Maintenance  Health Maintenance Due  Topic Date Due   COVID-19 Vaccine (4 - Booster for [place] series) 11/11/2020   INFLUENZA  VACCINE  05/20/2021    Colorectal cancer screening: Type of screening: Colonoscopy. Completed 06/17/2018. Repeat every 10 years  Lung Cancer Screening: (Low Dose CT Chest recommended if Age 95-80 years, 30 pack-year currently smoking OR have quit w/in 15years.) does not qualify.   Lung Cancer Screening Referral: no  Additional Screening:  Hepatitis C Screening: does qualify; Completed 11/28/2019  Vision Screening: Recommended annual ophthalmology exams for early detection of glaucoma and other disorders of the eye. Is the patient up to date with their annual eye exam?  Yes  Who is the provider or what is the name of the office in which the patient attends annual eye exams? [facility] If pt is not established with a provider, would they like to be referred to a provider to establish care? No .   Dental Screening: Recommended annual dental exams for proper oral hygiene  Community Resource Referral / Chronic Care Management: CRR required this visit?  No   CCM required this visit?  No      Plan:     I have personally reviewed and noted the following in the patients chart:   Medical and social history Use of alcohol, tobacco or illicit drugs  Current medications and supplements including opioid prescriptions. Patient is currently taking opioid prescriptions. Information provided to patient regarding non-opioid alternatives. Patient advised to discuss non-opioid treatment plan with their provider. Functional ability and status Nutritional status Physical activity Advanced directives List of other physicians Hospitalizations,  surgeries, and ER visits in previous 12 months Vitals Screenings to include cognitive, depression, and falls Referrals and appointments  In addition, I have reviewed and discussed with patient certain preventive protocols, quality metrics, and best practice recommendations. A written personalized care plan for preventive services as well as general preventive  health recommendations were provided to patient.     Kellie Simmering, LPN   10/25/1094   Nurse Notes: none  Due to this being a virtual visit, the after visit summary with patients personalized plan was offered to patient via mail or my-chart. Patient would like to access on my-chart

## 2021-11-25 ENCOUNTER — Other Ambulatory Visit: Payer: Self-pay | Admitting: Family Medicine

## 2021-11-25 DIAGNOSIS — R7989 Other specified abnormal findings of blood chemistry: Secondary | ICD-10-CM

## 2021-11-25 DIAGNOSIS — Z8739 Personal history of other diseases of the musculoskeletal system and connective tissue: Secondary | ICD-10-CM

## 2021-11-25 NOTE — Telephone Encounter (Signed)
No note needed 

## 2021-11-25 NOTE — Telephone Encounter (Signed)
My Chart message sent to the patient.

## 2021-11-25 NOTE — Telephone Encounter (Signed)
Referral to pain management has been sent

## 2021-12-03 DIAGNOSIS — G6289 Other specified polyneuropathies: Secondary | ICD-10-CM | POA: Diagnosis not present

## 2021-12-03 DIAGNOSIS — U099 Post covid-19 condition, unspecified: Secondary | ICD-10-CM | POA: Diagnosis not present

## 2021-12-31 ENCOUNTER — Ambulatory Visit: Payer: PPO | Admitting: Family Medicine

## 2022-01-06 ENCOUNTER — Other Ambulatory Visit: Payer: Self-pay | Admitting: Family Medicine

## 2022-02-24 ENCOUNTER — Other Ambulatory Visit: Payer: Self-pay | Admitting: Family Medicine

## 2022-02-24 NOTE — Telephone Encounter (Signed)
Sent patient message to confirm dosage and if he is was still using each medication.  ?

## 2022-06-10 ENCOUNTER — Ambulatory Visit (INDEPENDENT_AMBULATORY_CARE_PROVIDER_SITE_OTHER): Payer: PPO | Admitting: Family Medicine

## 2022-06-10 ENCOUNTER — Encounter: Payer: Self-pay | Admitting: Family Medicine

## 2022-06-10 VITALS — BP 110/70 | HR 87 | Temp 96.8°F | Wt 164.4 lb

## 2022-06-10 DIAGNOSIS — M79605 Pain in left leg: Secondary | ICD-10-CM

## 2022-06-10 DIAGNOSIS — F341 Dysthymic disorder: Secondary | ICD-10-CM

## 2022-06-10 DIAGNOSIS — M25552 Pain in left hip: Secondary | ICD-10-CM | POA: Diagnosis not present

## 2022-06-10 DIAGNOSIS — F17200 Nicotine dependence, unspecified, uncomplicated: Secondary | ICD-10-CM | POA: Diagnosis not present

## 2022-06-10 DIAGNOSIS — E291 Testicular hypofunction: Secondary | ICD-10-CM | POA: Diagnosis not present

## 2022-06-10 MED ORDER — ESCITALOPRAM OXALATE 20 MG PO TABS: 20.0000 mg | ORAL_TABLET | Freq: Every day | ORAL | 1 refills | Status: DC

## 2022-06-10 MED ORDER — TESTOSTERONE 20.25 MG/ACT (1.62%) TD GEL: 2.0000 "application " | Freq: Every day | TRANSDERMAL | 5 refills | Status: AC

## 2022-06-10 NOTE — Progress Notes (Signed)
Subjective:    Patient ID: Harold Day, male    DOB: [birth year], 71 y.o.   MRN: [id number]  HPI He is here for a consult concerning continued difficulty with left hip and leg pain.  He has a very long and convoluted history of difficulty with that.  Also of note is the fact that in January 2022 he did have COVID and he states that during that timeframe he had relief of his symptoms.  The symptoms came back after the [place].he was initially seen by me and then referred to Dr. Erlinda Hong.  He was given physical therapy which had little effect on that.  MRI scans were done which did show some minimal changes.  Because of continued difficulty he did have epidural injections which were not very useful.  He was seen by Dr. Charlann Boxer who thought that some of his symptoms could have possibly been shingles related and did give acyclovir as well as gabapentin again without much success.  He also had an EMG done which was negative for any pathology.  During the work-up he was also shown to have low testosterone and was seen by endocrine.  He was supposed to be placed on medications but did not follow-up with that.  He has continued to have left hip and back pain.  He was seen by someone through the Internet and given ivermectin.  He states that he indeed did have some relief of his symptoms.  He then apparently saw a gentleman named Dr. Lenise Arena who apparently checked the blood pressure on the left side and was told it was low.  He was supposed to refer him to a vascular surgeon but apparently did not.  He does state that physical activity actually makes the left hip and leg pain worse with decreased activity happening but does state that the pain is also better at night when he is laying down.  During all this he was eventually placed back on Lexapro.  In the past he had been on Effexor but states that Lexapro seems to be working fairly well.  He apparently was told to stop his statin but does not know why.  Also  he has started smoking again and is interested in smoking cessation however I told him we would need to wait on that.   Review of Systems     Objective:   Physical Exam Alert and complaining of left hip and leg pain.  Pulses difficult to feel but does have good capillary refill.       Assessment & Plan:  Left leg pain - Plan: ABI (BACK OFFICE)  Left hip pain  Hypogonadism in male - Plan: Testosterone (ANDROGEL PUMP) 20.25 MG/ACT (1.62%) GEL  Persistent depressive disorder - Plan: escitalopram (LEXAPRO) 20 MG tablet  Smoker He has had a very extensive work-up as well as treatment from the back pain in regard to his back and findings on the MRI which are very minimal.  Might possibly consider doing another MRI but not at this point.  I will definitely put him on testosterone since he does have low numbers but doubt that that is causing any symptoms of his back or leg pain.  I think it is reasonable to make sure that his vascularity in his legs are appropriate and will therefore check an ABI and possibly work that up further.  He will be placed back on Lexapro.  Also encouraged him to start taking the statin again as his symptoms I  do not think are related to any potential issues related to it.  Over 45 minutes spent discussing these issues with him.  He will return here in 1 month.

## 2022-06-13 ENCOUNTER — Other Ambulatory Visit: Payer: Self-pay

## 2022-06-13 DIAGNOSIS — M79605 Pain in left leg: Secondary | ICD-10-CM

## 2022-06-16 ENCOUNTER — Ambulatory Visit (HOSPITAL_COMMUNITY)
Admission: RE | Admit: 2022-06-16 | Discharge: 2022-06-16 | Disposition: A | Discharge status: Home / Self Care | Payer: PPO | Source: Ambulatory Visit | Attending: Vascular Surgery | Admitting: Vascular Surgery

## 2022-06-16 DIAGNOSIS — M79605 Pain in left leg: Secondary | ICD-10-CM | POA: Insufficient documentation

## 2022-06-17 ENCOUNTER — Other Ambulatory Visit: Payer: Self-pay | Admitting: Family Medicine

## 2022-06-17 DIAGNOSIS — R6889 Other general symptoms and signs: Secondary | ICD-10-CM

## 2022-06-20 ENCOUNTER — Telehealth: Payer: Self-pay | Admitting: Family Medicine

## 2022-06-20 NOTE — Telephone Encounter (Signed)
P.A. TESTOSTERONE °

## 2022-07-01 ENCOUNTER — Encounter: Payer: Self-pay | Admitting: Vascular Surgery

## 2022-07-01 ENCOUNTER — Ambulatory Visit: Payer: PPO | Admitting: Vascular Surgery

## 2022-07-01 VITALS — BP 130/71 | HR 76 | Temp 98.3°F | Resp 20 | Ht 69.0 in | Wt 168.0 lb

## 2022-07-01 DIAGNOSIS — I739 Peripheral vascular disease, unspecified: Secondary | ICD-10-CM

## 2022-07-01 NOTE — Progress Notes (Signed)
[facility] [place]  ASSESSMENT / PLAN: 71 y.o. male with left hip discomfort in setting of abnormal ABIs.  His clinical exam is reassuring.  He has palpable pedal pulses.  His toe pressures are also reassuring and are greater than 100 bilaterally.  I do not think his left hip discomfort is being caused by arterial disease.  He can follow-up with me on an as-needed basis  CHIEF COMPLAINT: Hip pain  HISTORY OF PRESENT ILLNESS: Harold Day is a 71 y.o. male referred to clinic for evaluation of left lower extremity hip discomfort.  The patient has undergone an exhaustive work-up with his primary care physicians.  No cause for his hip discomfort has been found.  An ankle-brachial index was performed and read as abnormal.  Patient was referred for further evaluation.  On my evaluation the patient reports no symptoms typical of intermittent claudication.  He reports no effort induced cramping in his hip, buttock, thigh, or calf with walking that is relieved with rest.  He has no features of ischemic rest pain.  He has no ulcers about his feet.  Past Medical History:  Diagnosis Date   Alcoholic ([place])    Allergic rhinitis    Depression    Former smoker    quit 2011   GERD (gastroesophageal reflux disease)    Hepatitis C    noticed when he tried to give blood; never had illness    Past Surgical History:  Procedure Laterality Date   24 HOUR [place] STUDY N/A 09/18/2014   Procedure: 24 HOUR [place] STUDY;  Surgeon: Beryle Beams, MD;  Location: [facility];  Service: Endoscopy;  Laterality: N/A;   COLONOSCOPY     ESOPHAGEAL MANOMETRY N/A 09/18/2014   Procedure: ESOPHAGEAL MANOMETRY (EM);  Surgeon: Beryle Beams, MD;  Location: [facility];  Service: Endoscopy;  Laterality: N/A;   SHOULDER ARTHROSCOPY Left 04/2017   VASECTOMY      Family History  Problem Relation Age of Onset   Lung cancer Mother    Autism Son    Other Neg Hx     Social History   Socioeconomic  History   Marital status: Married    Spouse name: Not on file   Number of children: 2   Years of education: Not on file   Highest education level: Not on file  Occupational History   Occupation: retired (tobacco buyer)    Employer: [employer]  Tobacco Use   Smoking status: Former   Smokeless tobacco: Current    Types: Snuff  Vaping Use   Vaping Use: Former  Substance and Sexual Activity   Alcohol use: No    Comment: no alcohol since about 2000   Drug use: No   Sexual activity: Yes  Other Topics Concern   Not on file  Social History Narrative   Not on file   Social Determinants of Health   Financial Resource Strain: Low Risk  (11/22/2021)   Overall Financial Resource Strain (CARDIA)    Difficulty of Paying Living Expenses: Not hard at all  Food Insecurity: No Food Insecurity (11/22/2021)   Hunger Vital Sign    Worried About Running Out of Food in the Last Year: Never true    [place] in the Last Year: Never true  Transportation Needs: No Transportation Needs (11/22/2021)   PRAPARE - Hydrologist (Medical): No    Lack of Transportation (Non-Medical): No  Physical Activity: Insufficiently Active (  11/22/2021)   Exercise Vital Sign    Days of Exercise per Week: 4 days    Minutes of Exercise per Session: 20 min  Stress: No Stress Concern Present (11/22/2021)   [place]    Feeling of Stress : Not at all  Social Connections: Not on file  Intimate Partner Violence: Not on file    Allergies  Allergen Reactions   Lactose Intolerance (Gi)     Current Outpatient Medications  Medication Sig Dispense Refill   acyclovir (ZOVIRAX) 400 MG tablet Take 1 tablet (400 mg total) by mouth daily. 90 tablet 1   allopurinol (ZYLOPRIM) 300 MG tablet Take 1 tablet (300 mg total) by mouth daily. 90 tablet 0   aspirin 81 MG tablet Take 81 mg by mouth daily.     atorvastatin (LIPITOR)  20 MG tablet TAKE 1 TABLET(20 MG) BY MOUTH DAILY 90 tablet 1   colchicine 0.6 MG tablet Take 1 tablet (0.6 mg total) by mouth daily. 5 tablet 0   escitalopram (LEXAPRO) 20 MG tablet Take 1 tablet (20 mg total) by mouth daily. 30 tablet 1   esomeprazole (NEXIUM) 20 MG capsule Take 20 mg by mouth daily at 12 noon.     fish oil-omega-3 fatty acids 1000 MG capsule Take 2 g by mouth daily.     ibuprofen (ADVIL,MOTRIN) 200 MG tablet Take 600 mg by mouth every 4 (four) hours.     loperamide (IMODIUM A-D) 2 MG tablet Take 1 tablet (2 mg total) by mouth at bedtime as needed for diarrhea or loose stools. 12 tablet 0   Multiple Vitamins-Minerals (MULTIVITAMIN WITH MINERALS) tablet Take 1 tablet by mouth daily.     promethazine (PHENERGAN) 25 MG tablet Take 1 tablet (25 mg total) by mouth every 6 (six) hours as needed for nausea or vomiting. 20 tablet 0   propranolol (INDERAL) 20 MG tablet TAKE 3 TABLETS(60 MG) BY MOUTH DAILY AS NEEDED 270 tablet 0   Testosterone (ANDROGEL PUMP) 20.25 MG/ACT (1.62%) GEL Place 2 application  onto the skin daily. 75 g 5   traMADol (ULTRAM) 50 MG tablet Take 1 tablet (50 mg total) by mouth 3 (three) times daily as needed. 30 tablet 0   triamcinolone (NASACORT AQ) 55 MCG/ACT nasal inhaler Place 2 sprays into the nose daily. 1 Inhaler 12   No current facility-administered medications for this visit.    PHYSICAL EXAM Vitals:   07/01/22 1327  BP: 130/71  Pulse: 76  Resp: 20  Temp: 98.3 F (36.8 C)  SpO2: 96%  Weight: 168 lb (76.2 kg)  Height: '5\' 9"'$  (1.753 m)   Well-appearing gentleman in no acute distress Regular rate and rhythm Unlabored breathing Soft nontender abdomen Palpable posterior tibial pulses bilaterally  PERTINENT LABORATORY AND RADIOLOGIC DATA  Most recent CBC    Latest Ref Rng & Units 07/10/2021    3:39 PM 11/28/2020    9:19 AM 11/28/2019    9:50 AM  CBC  WBC 4.0 - 10.5 K/uL 11.8  8.8  9.8   Hemoglobin 13.0 - 17.0 g/dL 14.2  15.4  15.9    Hematocrit 39.0 - 52.0 % 42.5  44.2  46.7   Platelets 150.0 - 400.0 K/uL 271.0  256  244      Most recent CMP    Latest Ref Rng & Units 11/15/2021    2:11 PM 09/23/2021    2:19 PM 07/10/2021    3:39 PM  CMP  Glucose  70 - 99 mg/dL 111   124   BUN 6 - 23 mg/dL 12   20   Creatinine 0.40 - 1.50 mg/dL 1.22  1.10  1.13   Sodium 135 - 145 mEq/L 139   136   Potassium 3.5 - 5.1 mEq/L 4.2   4.4   Chloride 96 - 112 mEq/L 105   102   CO2 19 - 32 mEq/L 26   28   Calcium 8.4 - 10.5 mg/dL 9.6   9.5    9.6   Total Protein 6.0 - 8.3 g/dL   7.1   Total Bilirubin 0.2 - 1.2 mg/dL   0.4   Alkaline Phos 39 - 117 U/L   77   AST 0 - 37 U/L   18   ALT 0 - 53 U/L   19     Renal function CrCl cannot be calculated (Patient's most recent lab result is older than the maximum 21 days allowed.).  No results found for: "HGBA1C"  LDL Cholesterol (Calc)  Date Value Ref Range Status  08/20/2017 144 (H) mg/dL (calc) Final    Comment:    Reference range: <100 . Desirable range <100 mg/dL for primary prevention;   <70 mg/dL for patients with CHD or diabetic patients  with > or = 2 CHD risk factors. Marland Kitchen LDL-C is now calculated using the Martin-Hopkins  calculation, which is a validated novel method providing  better accuracy than the Friedewald equation in the  estimation of LDL-C.  Cresenciano Genre et al. Annamaria Helling. 2841;324(40): 2061-2068  (http://education.[employer].com/faq/FAQ164)    LDL Chol Calc (NIH)  Date Value Ref Range Status  11/28/2020 136 (H) 0 - 99 mg/dL Final     Vascular Imaging: ABI non-compressible. Monophasic waveforms at the ankles bilaterally. Great toe pressure L 109 R 120.   Yevonne Aline. Stanford Breed, MD [facility] Phone Number: [phone] 07/01/2022 1:40 PM  Total time spent on preparing this encounter including chart review, data review, collecting history, examining the patient, coordinating care for this new patient, 60 minutes.  Portions  of this report may have been transcribed using voice recognition software.  Every effort has been made to ensure accuracy; however, inadvertent computerized transcription errors may still be present.

## 2022-07-08 DIAGNOSIS — H18413 Arcus senilis, bilateral: Secondary | ICD-10-CM | POA: Diagnosis not present

## 2022-07-08 DIAGNOSIS — H25043 Posterior subcapsular polar age-related cataract, bilateral: Secondary | ICD-10-CM | POA: Diagnosis not present

## 2022-07-08 DIAGNOSIS — H2512 Age-related nuclear cataract, left eye: Secondary | ICD-10-CM | POA: Diagnosis not present

## 2022-07-08 DIAGNOSIS — H25013 Cortical age-related cataract, bilateral: Secondary | ICD-10-CM | POA: Diagnosis not present

## 2022-07-08 DIAGNOSIS — H2513 Age-related nuclear cataract, bilateral: Secondary | ICD-10-CM | POA: Diagnosis not present

## 2022-07-16 ENCOUNTER — Encounter: Payer: Self-pay | Admitting: Family Medicine

## 2022-07-16 ENCOUNTER — Ambulatory Visit (INDEPENDENT_AMBULATORY_CARE_PROVIDER_SITE_OTHER): Payer: PPO | Admitting: Family Medicine

## 2022-07-16 VITALS — BP 120/74 | HR 72 | Temp 96.8°F | Ht 68.0 in | Wt 163.8 lb

## 2022-07-16 DIAGNOSIS — R4189 Other symptoms and signs involving cognitive functions and awareness: Secondary | ICD-10-CM

## 2022-07-16 DIAGNOSIS — E291 Testicular hypofunction: Secondary | ICD-10-CM | POA: Diagnosis not present

## 2022-07-16 DIAGNOSIS — M7918 Myalgia, other site: Secondary | ICD-10-CM

## 2022-07-16 DIAGNOSIS — F341 Dysthymic disorder: Secondary | ICD-10-CM

## 2022-07-16 DIAGNOSIS — Z8616 Personal history of COVID-19: Secondary | ICD-10-CM

## 2022-07-16 DIAGNOSIS — I700 Atherosclerosis of aorta: Secondary | ICD-10-CM

## 2022-07-16 DIAGNOSIS — R102 Pelvic and perineal pain: Secondary | ICD-10-CM

## 2022-07-16 NOTE — Progress Notes (Signed)
   Subjective:    Patient ID: Harold Day, male    DOB: 1951/07/28, 71 y.o.   MRN: [id number]  HPI He continues to have difficulty with hip and pelvic discomfort.  So for the work-up has been negative.  He has had an MRI of the spine, x-ray of the hip, vascular evaluation, all of which has been negative.  He has a previous history of COVID and still feels as if he is in a fog.  He states that he then saw a physician at [place] and was given ivermectin.  He states that it did help with his cognition.  He states that when he walks he feels gluteal pain however if he sits he feels the pain is in his pelvic area.  He continues on Lexapro and did have a few episodes initially of rage but says that lately he has been doing fairly well. As part of his work-up in the past testosterone levels were drawn and he shows evidence of hypogonadism.  He has yet to start the medication.  Review of Systems     Objective:   Physical Exam Alert and in no distress.  Full motion of the hip without pain.  No abdominal masses or tenderness is noted.       Assessment & Plan:  Pelvic pain - Plan: CT Abdomen Pelvis W Contrast  Gluteal pain - Plan: CT Abdomen Pelvis W Contrast  Hypogonadism in male  History of COVID-19  Persistent depressive disorder  Brain fog At this point the etiology of his symptoms is really unclear.  I discussed his COVID with him and the fact that there is no data on ivermectin doing any good.  He will continue on Lexapro.  I will do a CT of his abdomen and pelvis and see if [name] shows up.  I will check on his testosterone dosing to see why its not been sent.

## 2022-07-17 NOTE — Telephone Encounter (Signed)
No response from P.A. Called plan t# [phone] & they need labs and office notes faxed to plan at T# [phone], this was faxed

## 2022-07-17 NOTE — Telephone Encounter (Signed)
Additional form was completed and faxed for P.A.

## 2022-07-18 NOTE — Telephone Encounter (Signed)
P.A. approved til 01/16/23, sent mychart message

## 2022-07-29 ENCOUNTER — Encounter: Payer: Self-pay | Admitting: Internal Medicine

## 2022-08-06 ENCOUNTER — Other Ambulatory Visit: Payer: Self-pay | Admitting: Family Medicine

## 2022-08-06 DIAGNOSIS — F341 Dysthymic disorder: Secondary | ICD-10-CM

## 2022-08-06 NOTE — Telephone Encounter (Signed)
Refill request last apt 07/16/22 next apt 11/28/22.

## 2022-08-11 ENCOUNTER — Encounter: Payer: Self-pay | Admitting: Internal Medicine

## 2022-08-13 ENCOUNTER — Ambulatory Visit
Admission: RE | Admit: 2022-08-13 | Discharge: 2022-08-13 | Disposition: A | Discharge status: Home / Self Care | Payer: PPO | Source: Ambulatory Visit | Attending: Family Medicine | Admitting: Family Medicine

## 2022-08-13 DIAGNOSIS — I700 Atherosclerosis of aorta: Secondary | ICD-10-CM | POA: Diagnosis not present

## 2022-08-13 DIAGNOSIS — R102 Pelvic and perineal pain: Secondary | ICD-10-CM | POA: Diagnosis not present

## 2022-08-13 MED ORDER — IOPAMIDOL (ISOVUE-300) INJECTION 61%
100.0000 mL | Freq: Once | INTRAVENOUS | Status: AC | PRN
  Administered 2022-08-13: 100 mL via INTRAVENOUS

## 2022-08-18 ENCOUNTER — Other Ambulatory Visit: Payer: Self-pay | Admitting: Family Medicine

## 2022-08-18 MED ORDER — DULOXETINE HCL 30 MG PO CPEP: 30.0000 mg | ORAL_CAPSULE | Freq: Every day | ORAL | 0 refills | Status: DC

## 2022-08-18 NOTE — Progress Notes (Signed)
The work-up for his pain so far has found nothing.  He is also still having psychological difficulty and states that the Lexapro is causing difficulty.  I have decided to see if we can treat the pain and from a neuropathic point of view and help psychologically so we will place him on Cymbalta.

## 2022-09-16 DIAGNOSIS — Z7689 Persons encountering health services in other specified circumstances: Secondary | ICD-10-CM | POA: Diagnosis not present

## 2022-09-16 DIAGNOSIS — M1A9XX0 Chronic gout, unspecified, without tophus (tophi): Secondary | ICD-10-CM | POA: Diagnosis not present

## 2022-09-16 DIAGNOSIS — K219 Gastro-esophageal reflux disease without esophagitis: Secondary | ICD-10-CM | POA: Diagnosis not present

## 2022-09-16 DIAGNOSIS — E782 Mixed hyperlipidemia: Secondary | ICD-10-CM | POA: Diagnosis not present

## 2022-09-16 DIAGNOSIS — Z6825 Body mass index (BMI) 25.0-25.9, adult: Secondary | ICD-10-CM | POA: Diagnosis not present

## 2022-09-16 DIAGNOSIS — H269 Unspecified cataract: Secondary | ICD-10-CM | POA: Diagnosis not present

## 2022-09-19 DIAGNOSIS — G90A Postural orthostatic tachycardia syndrome (POTS): Secondary | ICD-10-CM | POA: Diagnosis not present

## 2022-09-19 DIAGNOSIS — M79604 Pain in right leg: Secondary | ICD-10-CM | POA: Diagnosis not present

## 2022-09-19 DIAGNOSIS — R3915 Urgency of urination: Secondary | ICD-10-CM | POA: Diagnosis not present

## 2022-09-19 DIAGNOSIS — M79662 Pain in left lower leg: Secondary | ICD-10-CM | POA: Diagnosis not present

## 2022-09-19 DIAGNOSIS — M79605 Pain in left leg: Secondary | ICD-10-CM | POA: Diagnosis not present

## 2022-09-19 DIAGNOSIS — R3589 Other polyuria: Secondary | ICD-10-CM | POA: Diagnosis not present

## 2022-09-19 DIAGNOSIS — R531 Weakness: Secondary | ICD-10-CM | POA: Diagnosis not present

## 2022-09-19 DIAGNOSIS — R1030 Lower abdominal pain, unspecified: Secondary | ICD-10-CM | POA: Diagnosis not present

## 2022-09-19 DIAGNOSIS — R102 Pelvic and perineal pain: Secondary | ICD-10-CM | POA: Diagnosis not present

## 2022-09-19 DIAGNOSIS — R351 Nocturia: Secondary | ICD-10-CM | POA: Diagnosis not present

## 2022-09-19 DIAGNOSIS — M79661 Pain in right lower leg: Secondary | ICD-10-CM | POA: Diagnosis not present

## 2022-09-19 DIAGNOSIS — N50812 Left testicular pain: Secondary | ICD-10-CM | POA: Diagnosis not present

## 2022-09-27 ENCOUNTER — Other Ambulatory Visit: Payer: Self-pay

## 2022-09-27 ENCOUNTER — Emergency Department (HOSPITAL_COMMUNITY)
Admission: EM | Admit: 2022-09-27 | Discharge: 2022-09-27 | Disposition: A | Discharge status: Home / Self Care | Payer: PPO | Attending: Emergency Medicine | Admitting: Emergency Medicine

## 2022-09-27 ENCOUNTER — Encounter (HOSPITAL_COMMUNITY): Payer: Self-pay | Admitting: *Deleted

## 2022-09-27 ENCOUNTER — Emergency Department (HOSPITAL_COMMUNITY): Payer: PPO

## 2022-09-27 DIAGNOSIS — T7840XA Allergy, unspecified, initial encounter: Secondary | ICD-10-CM

## 2022-09-27 DIAGNOSIS — Z7982 Long term (current) use of aspirin: Secondary | ICD-10-CM | POA: Diagnosis not present

## 2022-09-27 DIAGNOSIS — L509 Urticaria, unspecified: Secondary | ICD-10-CM | POA: Diagnosis not present

## 2022-09-27 DIAGNOSIS — R0602 Shortness of breath: Secondary | ICD-10-CM | POA: Diagnosis not present

## 2022-09-27 DIAGNOSIS — R079 Chest pain, unspecified: Secondary | ICD-10-CM | POA: Diagnosis not present

## 2022-09-27 DIAGNOSIS — R0789 Other chest pain: Secondary | ICD-10-CM | POA: Diagnosis not present

## 2022-09-27 DIAGNOSIS — T782XXA Anaphylactic shock, unspecified, initial encounter: Secondary | ICD-10-CM | POA: Diagnosis not present

## 2022-09-27 LAB — COMPREHENSIVE METABOLIC PANEL
ALT: 19 U/L (ref 0–44)
AST: 32 U/L (ref 15–41)
Albumin: 3.6 g/dL (ref 3.5–5.0)
Alkaline Phosphatase: 53 U/L (ref 38–126)
Anion gap: 10 (ref 5–15)
BUN: 16 mg/dL (ref 8–23)
CO2: 21 mmol/L — ABNORMAL LOW (ref 22–32)
Calcium: 8.9 mg/dL (ref 8.9–10.3)
Chloride: 108 mmol/L (ref 98–111)
Creatinine, Ser: 1.4 mg/dL — ABNORMAL HIGH (ref 0.61–1.24)
GFR, Estimated: 54 mL/min — ABNORMAL LOW (ref >60)
Glucose, Bld: 197 mg/dL — ABNORMAL HIGH (ref 70–99)
Potassium: 3.7 mmol/L (ref 3.5–5.1)
Sodium: 139 mmol/L (ref 135–145)
Total Bilirubin: 0.5 mg/dL (ref 0.3–1.2)
Total Protein: 6.1 g/dL — ABNORMAL LOW (ref 6.5–8.1)

## 2022-09-27 LAB — CBC
HCT: 41.3 % (ref 39.0–52.0)
Hemoglobin: 14 g/dL (ref 13.0–17.0)
MCH: 31.5 pg (ref 26.0–34.0)
MCHC: 33.9 g/dL (ref 30.0–36.0)
MCV: 93 fL (ref 80.0–100.0)
Platelets: 282 10*3/uL (ref 150–400)
RBC: 4.44 MIL/uL (ref 4.22–5.81)
RDW: 13.7 % (ref 11.5–15.5)
WBC: 14.3 10*3/uL — ABNORMAL HIGH (ref 4.0–10.5)
nRBC: 0 % (ref 0.0–0.2)

## 2022-09-27 LAB — TROPONIN I (HIGH SENSITIVITY)
Troponin I (High Sensitivity): 5 ng/L (ref <18)
Troponin I (High Sensitivity): 4 ng/L (ref <18)

## 2022-09-27 MED ORDER — EPINEPHRINE 0.3 MG/0.3ML IJ SOAJ: 0.3000 mg | INTRAMUSCULAR | 0 refills | Status: DC | PRN

## 2022-09-27 MED ORDER — METHYLPREDNISOLONE SODIUM SUCC 125 MG IJ SOLR
125.0000 mg | Freq: Once | INTRAMUSCULAR | Status: AC
  Administered 2022-09-27: 125 mg via INTRAVENOUS
  Filled 2022-09-27: qty 2

## 2022-09-27 MED ORDER — PREDNISONE 20 MG PO TABS: 40.0000 mg | ORAL_TABLET | Freq: Every day | ORAL | 0 refills | Status: DC

## 2022-09-27 NOTE — ED Provider Notes (Signed)
[facility] EMERGENCY DEPARTMENT Provider Note   CSN: [id number] Arrival date & time: 09/27/22  2005     History  Chief Complaint  Patient presents with   Allergic Reaction   Chest Pain    Harold Day is a 71 y.o. male.   Allergic Reaction Chest Pain Patient presents with rash and chest pain.  Began this evening.  States he was just watching golf.  He can do it on his thigh and then noticed rash all over.  He can have some left-sided chest pain.  Felt a little short of breath.  Had taken Benadryl 50 mg at home and went to fire department was given epinephrine.  Came in by EMS.  No known allergic reactions.  No known cardiac history.     Home Medications Prior to Admission medications   Medication Sig Start Date End Date Taking? Authorizing Provider  EPINEPHrine 0.3 mg/0.3 mL IJ SOAJ injection Inject 0.3 mg into the muscle as needed for anaphylaxis. 09/27/22  Yes Davonna Belling, MD  predniSONE (DELTASONE) 20 MG tablet Take 2 tablets (40 mg total) by mouth daily. 09/27/22  Yes Davonna Belling, MD  acyclovir (ZOVIRAX) 400 MG tablet Take 1 tablet (400 mg total) by mouth daily. 11/04/21   Lyndal Pulley, DO  allopurinol (ZYLOPRIM) 300 MG tablet Take 1 tablet (300 mg total) by mouth daily. 11/19/21   Lyndal Pulley, DO  aspirin 81 MG tablet Take 81 mg by mouth daily.    [provider]  atorvastatin (LIPITOR) 20 MG tablet TAKE 1 TABLET(20 MG) BY MOUTH DAILY 11/15/21   Denita Lung, MD  colchicine 0.6 MG tablet Take 1 tablet (0.6 mg total) by mouth daily. 09/05/21   Lyndal Pulley, DO  DULoxetine (CYMBALTA) 30 MG capsule Take 1 capsule (30 mg total) by mouth daily. 08/18/22   Denita Lung, MD  esomeprazole (NEXIUM) 20 MG capsule Take 20 mg by mouth daily at 12 noon.    [provider]  fish oil-omega-3 fatty acids 1000 MG capsule Take 2 g by mouth daily.    [provider]  ibuprofen (ADVIL,MOTRIN) 200 MG tablet Take 600 mg  by mouth every 4 (four) hours.    [provider]  loperamide (IMODIUM A-D) 2 MG tablet Take 1 tablet (2 mg total) by mouth at bedtime as needed for diarrhea or loose stools. Patient not taking: Reported on 07/16/2022 02/23/20   Tysinger, Camelia Eng, PA-C  Multiple Vitamins-Minerals (MULTIVITAMIN WITH MINERALS) tablet Take 1 tablet by mouth daily.    [provider]  promethazine (PHENERGAN) 25 MG tablet Take 1 tablet (25 mg total) by mouth every 6 (six) hours as needed for nausea or vomiting. Patient not taking: Reported on 07/16/2022 02/23/20   Carlena Hurl, PA-C  propranolol (INDERAL) 20 MG tablet TAKE 3 TABLETS(60 MG) BY MOUTH DAILY AS NEEDED 09/04/20   Tat, Eustace Quail, DO  Testosterone (ANDROGEL PUMP) 20.25 MG/ACT (1.62%) GEL Place 2 application  onto the skin daily. Patient not taking: Reported on 07/16/2022 06/10/22   Denita Lung, MD  traMADol (ULTRAM) 50 MG tablet Take 1 tablet (50 mg total) by mouth 3 (three) times daily as needed. Patient not taking: Reported on 07/16/2022 07/01/21   Aundra Dubin, PA-C  triamcinolone (NASACORT AQ) 55 MCG/ACT nasal inhaler Place 2 sprays into the nose daily. 05/11/13   Denita Lung, MD      Allergies    Lactose intolerance (gi)  Review of Systems   Review of Systems  Cardiovascular:  Positive for chest pain.    Physical Exam Updated Vital Signs BP 127/69   Pulse 77   Temp 98.6 F (37 C) (Oral)   Resp 15   SpO2 98%  Physical Exam Vitals reviewed.  Cardiovascular:     Rate and Rhythm: Regular rhythm.  Pulmonary:     Breath sounds: Normal breath sounds.  Chest:     Chest wall: No crepitus.  Abdominal:     Tenderness: There is no abdominal tenderness.  Musculoskeletal:     Right lower leg: No edema.     Left lower leg: No edema.  Skin:    Comments: Somewhat diffuse hives over chest face arms.  Some leg involvement.  No oral involvement.  No chest tenderness.  No wheezing.  Neurological:     Mental Status: He is  alert.     ED Results / Procedures / Treatments   Labs (all labs ordered are listed, but only abnormal results are displayed) Labs Reviewed  COMPREHENSIVE METABOLIC PANEL - Abnormal; Notable for the following components:      Result Value   CO2 21 (*)    Glucose, Bld 197 (*)    Creatinine, Ser 1.40 (*)    Total Protein 6.1 (*)    GFR, Estimated 54 (*)    All other components within normal limits  CBC - Abnormal; Notable for the following components:   WBC 14.3 (*)    All other components within normal limits  TROPONIN I (HIGH SENSITIVITY)  TROPONIN I (HIGH SENSITIVITY)    EKG EKG Interpretation  Date/Time:  Saturday September 27 2022 20:12:16 EST Ventricular Rate:  80 PR Interval:  150 QRS Duration: 83 QT Interval:  388 QTC Calculation: 448 R Axis:   48 Text Interpretation: Sinus rhythm Confirmed by Davonna Belling [phone]) on 09/27/2022 9:11:22 PM  Radiology DG Chest Portable 1 View  Result Date: 09/27/2022 CLINICAL DATA:  Shortness of breath EXAM: PORTABLE CHEST 1 VIEW COMPARISON:  09/16/2021 FINDINGS: Rotated AP portable examination. The heart size and mediastinal contours are within normal limits. Both lungs are clear. The visualized skeletal structures are unremarkable. IMPRESSION: Rotated AP portable examination. No acute abnormality of the lungs. Electronically Signed   By: Delanna Ahmadi M.D.   On: 09/27/2022 20:40    Procedures Procedures    Medications Ordered in ED Medications  methylPREDNISolone sodium succinate (SOLU-MEDROL) 125 mg/2 mL injection 125 mg (125 mg Intravenous Given 09/27/22 2027)    ED Course/ Medical Decision Making/ A&P                           Medical Decision Making Amount and/or Complexity of Data Reviewed Labs: ordered. Radiology: ordered.  Risk Prescription drug management.   Patient with a rash.  Hives.  Had some chest pain and nausea.  Has been given Pepcid and Benadryl.  Got EpiPen by EMS.  Developed some chest pain today  given aspirin nitroglycerin.  Differential diagnosis includes allergic reaction.  Doubt cardiac ischemia.  EKG reassuring.  Troponin negative.  Chest pain preceded getting the epinephrine.  Felt better after the epinephrine.  Will continue to monitor.  Will likely discharge with short course of steroids and outpatient follow-up. Of note patient's wife has an alpha gal allergy and patient is worried that he could have the same.  He did eat a hamburger about 5 hours prior to the symptoms starting.  Patient  is been monitored for around 3 hours.  Continues to to feel good without worsening again.  I think.  Stable for discharge home.  Discharge home with plan for steroid tomorrow and given EpiPen.  Follow-up with PCP or allergy.  Discharge home         Final Clinical Impression(s) / ED Diagnoses Final diagnoses:  Allergic reaction, initial encounter    Rx / DC Orders ED Discharge Orders          Ordered    EPINEPHrine 0.3 mg/0.3 mL IJ SOAJ injection  As needed        09/27/22 2300    predniSONE (DELTASONE) 20 MG tablet  Daily        09/27/22 2300              Davonna Belling, MD 09/27/22 2304

## 2022-09-27 NOTE — ED Triage Notes (Signed)
Pt arrived with EMS for allergic reaction. Pt started itching earlier today, noticed hives on abd with  CP, nausea.Harold Day '50mg'$  pepcid and '50mg'$  benadryl. Went to the fire station and given 0.3IM epi. EMS gave '324mg'$  ASA and nitro x1. Pt reports relief of symptoms. EMS VS 145/75, 84 pulse, resp 14,96% on RA CBG 141. Pt denies any new foods, lotions, clothing detergent

## 2022-09-29 ENCOUNTER — Ambulatory Visit (INDEPENDENT_AMBULATORY_CARE_PROVIDER_SITE_OTHER): Payer: PPO | Admitting: Family Medicine

## 2022-09-29 ENCOUNTER — Encounter: Payer: Self-pay | Admitting: Family Medicine

## 2022-09-29 VITALS — BP 122/74 | HR 73 | Temp 97.8°F | Wt 170.6 lb

## 2022-09-29 DIAGNOSIS — T7840XA Allergy, unspecified, initial encounter: Secondary | ICD-10-CM | POA: Diagnosis not present

## 2022-09-29 DIAGNOSIS — T781XXA Other adverse food reactions, not elsewhere classified, initial encounter: Secondary | ICD-10-CM

## 2022-09-29 NOTE — Progress Notes (Signed)
   Subjective:    Patient ID: Harold Day, male    DOB: November 14, 1950, 71 y.o.   MRN: [id number]  HPI He was seen in the emergency room over the weekend for evaluation of a rash.  He was treated with steroids which helps.  Apparently he has not been exposed to any particular soaps, detergents, medications etc.  He does relate the fact that he did have a tick bite in the recent past but he is not sure what kind of tick.  Presently he is having no symptoms.   Review of Systems     Objective:   Physical Exam Alert and in no distress.  Exam of the skin is normal.       Assessment & Plan:  Allergic reaction, initial encounter - Plan: Alpha-Gal Panel I think it is reasonable to look to see if this is alpha gal and we will proceed based on that.   The test did come back positive.  I informed him of this, called an EpiPen and instructed him to avoid minimally in food and also be aware of products that might have mammalian ingredients in it.

## 2022-10-01 LAB — ALPHA-GAL PANEL
Allergen Lamb IgE: 18.9 kU/L — AB
Beef IgE: 27.8 kU/L — AB
IgE (Immunoglobulin E), Serum: 551 IU/mL — ABNORMAL HIGH (ref 6–495)
O215-IgE Alpha-Gal: 89 kU/L — AB
Pork IgE: 13.1 kU/L — AB

## 2022-10-02 DIAGNOSIS — T781XXA Other adverse food reactions, not elsewhere classified, initial encounter: Secondary | ICD-10-CM | POA: Insufficient documentation

## 2022-10-02 MED ORDER — EPINEPHRINE 0.3 MG/0.3ML IJ SOAJ: 0.3000 mg | INTRAMUSCULAR | 1 refills | Status: DC | PRN

## 2022-10-02 NOTE — Addendum Note (Signed)
Addended by: Denita Lung on: 10/02/2022 08:45 AM   Modules accepted: Orders

## 2022-10-15 ENCOUNTER — Other Ambulatory Visit: Payer: Self-pay | Admitting: Family Medicine

## 2022-10-27 DIAGNOSIS — H52202 Unspecified astigmatism, left eye: Secondary | ICD-10-CM | POA: Diagnosis not present

## 2022-10-27 DIAGNOSIS — H2512 Age-related nuclear cataract, left eye: Secondary | ICD-10-CM | POA: Diagnosis not present

## 2022-10-28 DIAGNOSIS — H2511 Age-related nuclear cataract, right eye: Secondary | ICD-10-CM | POA: Diagnosis not present

## 2022-11-10 DIAGNOSIS — H52201 Unspecified astigmatism, right eye: Secondary | ICD-10-CM | POA: Diagnosis not present

## 2022-11-10 DIAGNOSIS — H2511 Age-related nuclear cataract, right eye: Secondary | ICD-10-CM | POA: Diagnosis not present

## 2022-11-21 ENCOUNTER — Other Ambulatory Visit: Payer: Self-pay | Admitting: Physician Assistant

## 2022-11-21 DIAGNOSIS — M1A9XX0 Chronic gout, unspecified, without tophus (tophi): Secondary | ICD-10-CM | POA: Diagnosis not present

## 2022-11-21 DIAGNOSIS — F17200 Nicotine dependence, unspecified, uncomplicated: Secondary | ICD-10-CM

## 2022-11-21 DIAGNOSIS — Z131 Encounter for screening for diabetes mellitus: Secondary | ICD-10-CM | POA: Diagnosis not present

## 2022-11-21 DIAGNOSIS — K219 Gastro-esophageal reflux disease without esophagitis: Secondary | ICD-10-CM | POA: Diagnosis not present

## 2022-11-21 DIAGNOSIS — Z122 Encounter for screening for malignant neoplasm of respiratory organs: Secondary | ICD-10-CM | POA: Diagnosis not present

## 2022-11-21 DIAGNOSIS — Z125 Encounter for screening for malignant neoplasm of prostate: Secondary | ICD-10-CM | POA: Diagnosis not present

## 2022-11-21 DIAGNOSIS — Z136 Encounter for screening for cardiovascular disorders: Secondary | ICD-10-CM | POA: Diagnosis not present

## 2022-11-21 DIAGNOSIS — R030 Elevated blood-pressure reading, without diagnosis of hypertension: Secondary | ICD-10-CM | POA: Diagnosis not present

## 2022-11-21 DIAGNOSIS — Z0000 Encounter for general adult medical examination without abnormal findings: Secondary | ICD-10-CM | POA: Diagnosis not present

## 2022-11-21 DIAGNOSIS — R69 Illness, unspecified: Secondary | ICD-10-CM | POA: Diagnosis not present

## 2022-11-21 DIAGNOSIS — E291 Testicular hypofunction: Secondary | ICD-10-CM | POA: Diagnosis not present

## 2022-11-21 DIAGNOSIS — E782 Mixed hyperlipidemia: Secondary | ICD-10-CM | POA: Diagnosis not present

## 2022-11-21 DIAGNOSIS — H269 Unspecified cataract: Secondary | ICD-10-CM | POA: Diagnosis not present

## 2022-11-27 ENCOUNTER — Telehealth: Payer: Self-pay | Admitting: Family Medicine

## 2022-11-27 DIAGNOSIS — R69 Illness, unspecified: Secondary | ICD-10-CM | POA: Diagnosis not present

## 2022-11-27 NOTE — Telephone Encounter (Signed)
[place] at [facility] records request forwarded to [facility]

## 2022-11-28 ENCOUNTER — Ambulatory Visit: Payer: PPO

## 2022-12-02 ENCOUNTER — Ambulatory Visit
Admission: RE | Admit: 2022-12-02 | Discharge: 2022-12-02 | Disposition: A | Discharge status: Home / Self Care | Payer: Medicare HMO | Source: Ambulatory Visit | Attending: Physician Assistant | Admitting: Physician Assistant

## 2022-12-02 DIAGNOSIS — Z136 Encounter for screening for cardiovascular disorders: Secondary | ICD-10-CM | POA: Diagnosis not present

## 2022-12-02 DIAGNOSIS — F17200 Nicotine dependence, unspecified, uncomplicated: Secondary | ICD-10-CM

## 2022-12-11 DIAGNOSIS — R69 Illness, unspecified: Secondary | ICD-10-CM | POA: Diagnosis not present

## 2022-12-18 DIAGNOSIS — R69 Illness, unspecified: Secondary | ICD-10-CM | POA: Diagnosis not present

## 2022-12-23 DIAGNOSIS — F331 Major depressive disorder, recurrent, moderate: Secondary | ICD-10-CM | POA: Diagnosis not present

## 2022-12-23 DIAGNOSIS — F411 Generalized anxiety disorder: Secondary | ICD-10-CM | POA: Diagnosis not present

## 2022-12-23 DIAGNOSIS — R69 Illness, unspecified: Secondary | ICD-10-CM | POA: Diagnosis not present

## 2022-12-23 DIAGNOSIS — Z6825 Body mass index (BMI) 25.0-25.9, adult: Secondary | ICD-10-CM | POA: Diagnosis not present

## 2022-12-29 ENCOUNTER — Ambulatory Visit
Admission: RE | Admit: 2022-12-29 | Discharge: 2022-12-29 | Disposition: A | Discharge status: Home / Self Care | Payer: Medicare HMO | Source: Ambulatory Visit | Attending: Physician Assistant | Admitting: Physician Assistant

## 2022-12-29 DIAGNOSIS — F17200 Nicotine dependence, unspecified, uncomplicated: Secondary | ICD-10-CM

## 2022-12-29 DIAGNOSIS — Z122 Encounter for screening for malignant neoplasm of respiratory organs: Secondary | ICD-10-CM

## 2022-12-29 DIAGNOSIS — R69 Illness, unspecified: Secondary | ICD-10-CM | POA: Diagnosis not present

## 2022-12-29 DIAGNOSIS — F17210 Nicotine dependence, cigarettes, uncomplicated: Secondary | ICD-10-CM | POA: Diagnosis not present

## 2023-01-19 DIAGNOSIS — F411 Generalized anxiety disorder: Secondary | ICD-10-CM | POA: Diagnosis not present

## 2023-01-19 DIAGNOSIS — M1A9XX0 Chronic gout, unspecified, without tophus (tophi): Secondary | ICD-10-CM | POA: Diagnosis not present

## 2023-01-19 DIAGNOSIS — F331 Major depressive disorder, recurrent, moderate: Secondary | ICD-10-CM | POA: Diagnosis not present

## 2023-01-19 DIAGNOSIS — R69 Illness, unspecified: Secondary | ICD-10-CM | POA: Diagnosis not present

## 2023-01-19 DIAGNOSIS — Z6824 Body mass index (BMI) 24.0-24.9, adult: Secondary | ICD-10-CM | POA: Diagnosis not present

## 2023-01-27 DIAGNOSIS — R69 Illness, unspecified: Secondary | ICD-10-CM | POA: Diagnosis not present

## 2023-02-09 DIAGNOSIS — Z6824 Body mass index (BMI) 24.0-24.9, adult: Secondary | ICD-10-CM | POA: Diagnosis not present

## 2023-02-09 DIAGNOSIS — Z716 Tobacco abuse counseling: Secondary | ICD-10-CM | POA: Diagnosis not present

## 2023-02-09 DIAGNOSIS — F411 Generalized anxiety disorder: Secondary | ICD-10-CM | POA: Diagnosis not present

## 2023-02-09 DIAGNOSIS — F17210 Nicotine dependence, cigarettes, uncomplicated: Secondary | ICD-10-CM | POA: Diagnosis not present

## 2023-02-12 DIAGNOSIS — R69 Illness, unspecified: Secondary | ICD-10-CM | POA: Diagnosis not present

## 2023-02-17 DIAGNOSIS — R69 Illness, unspecified: Secondary | ICD-10-CM | POA: Diagnosis not present

## 2023-02-25 DIAGNOSIS — T504X5A Adverse effect of drugs affecting uric acid metabolism, initial encounter: Secondary | ICD-10-CM | POA: Diagnosis not present

## 2023-02-25 DIAGNOSIS — R21 Rash and other nonspecific skin eruption: Secondary | ICD-10-CM | POA: Diagnosis not present

## 2023-02-25 DIAGNOSIS — Z6824 Body mass index (BMI) 24.0-24.9, adult: Secondary | ICD-10-CM | POA: Diagnosis not present

## 2023-03-04 DIAGNOSIS — Z6823 Body mass index (BMI) 23.0-23.9, adult: Secondary | ICD-10-CM | POA: Diagnosis not present

## 2023-03-04 DIAGNOSIS — T504X5A Adverse effect of drugs affecting uric acid metabolism, initial encounter: Secondary | ICD-10-CM | POA: Diagnosis not present

## 2023-03-04 DIAGNOSIS — M1A9XX0 Chronic gout, unspecified, without tophus (tophi): Secondary | ICD-10-CM | POA: Diagnosis not present

## 2023-03-09 DIAGNOSIS — Z09 Encounter for follow-up examination after completed treatment for conditions other than malignant neoplasm: Secondary | ICD-10-CM | POA: Diagnosis not present

## 2023-03-09 DIAGNOSIS — D123 Benign neoplasm of transverse colon: Secondary | ICD-10-CM | POA: Diagnosis not present

## 2023-03-09 DIAGNOSIS — Z86010 Personal history of colonic polyps: Secondary | ICD-10-CM | POA: Diagnosis not present

## 2023-03-11 DIAGNOSIS — F17210 Nicotine dependence, cigarettes, uncomplicated: Secondary | ICD-10-CM | POA: Diagnosis not present

## 2023-03-11 DIAGNOSIS — Z6823 Body mass index (BMI) 23.0-23.9, adult: Secondary | ICD-10-CM | POA: Diagnosis not present

## 2023-03-11 DIAGNOSIS — D123 Benign neoplasm of transverse colon: Secondary | ICD-10-CM | POA: Diagnosis not present

## 2023-03-11 DIAGNOSIS — Z716 Tobacco abuse counseling: Secondary | ICD-10-CM | POA: Diagnosis not present

## 2023-03-11 DIAGNOSIS — F411 Generalized anxiety disorder: Secondary | ICD-10-CM | POA: Diagnosis not present

## 2023-03-12 DIAGNOSIS — R69 Illness, unspecified: Secondary | ICD-10-CM | POA: Diagnosis not present

## 2023-03-17 DIAGNOSIS — R69 Illness, unspecified: Secondary | ICD-10-CM | POA: Diagnosis not present

## 2023-03-25 IMAGING — CR DG LUMBAR SPINE COMPLETE 4+V
5 series · 5 of 5 positions shown · non-contrast
Comparison: None.

CLINICAL DATA: Low back and left hip pain for several months.

EXAM:
LUMBAR SPINE - COMPLETE 4+ VIEW

[t l-spine a.p.]
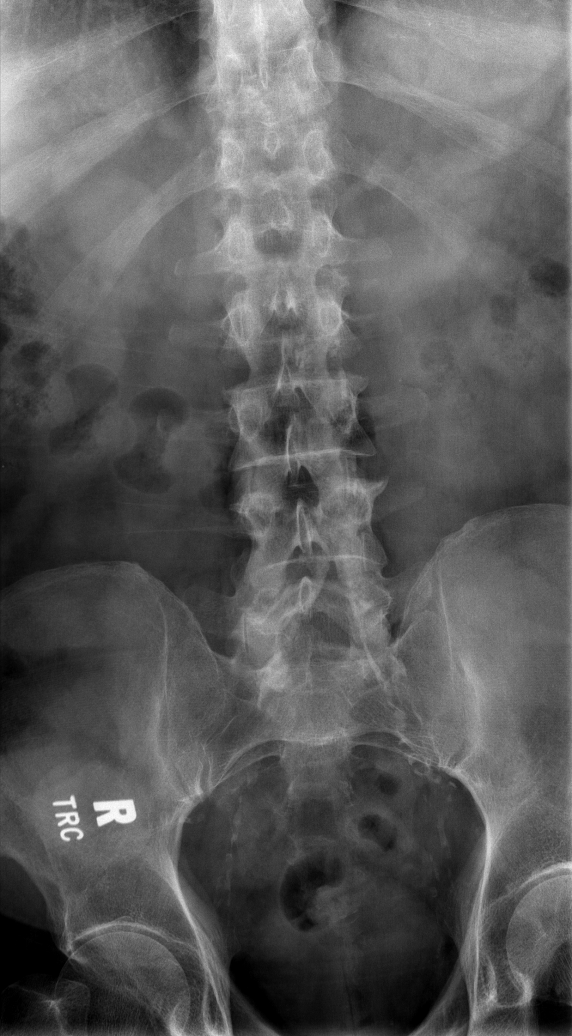

[t l-spine oblique exposure (1 of 2)]
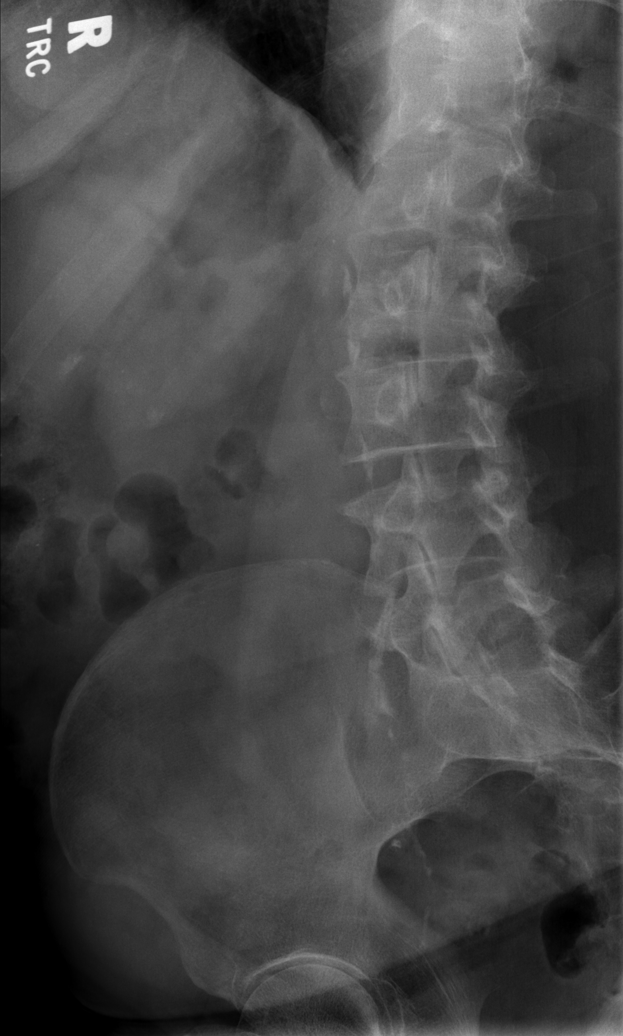

[t l-spine oblique exposure (2 of 2)]
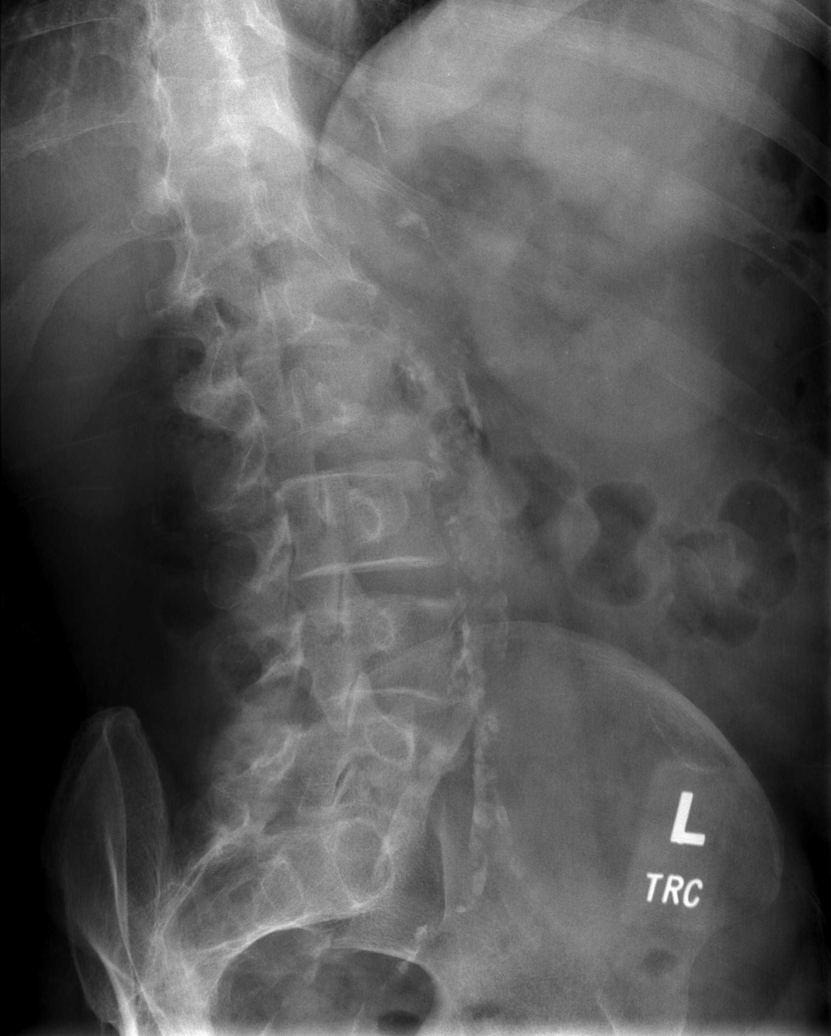

[t l-spine lat]
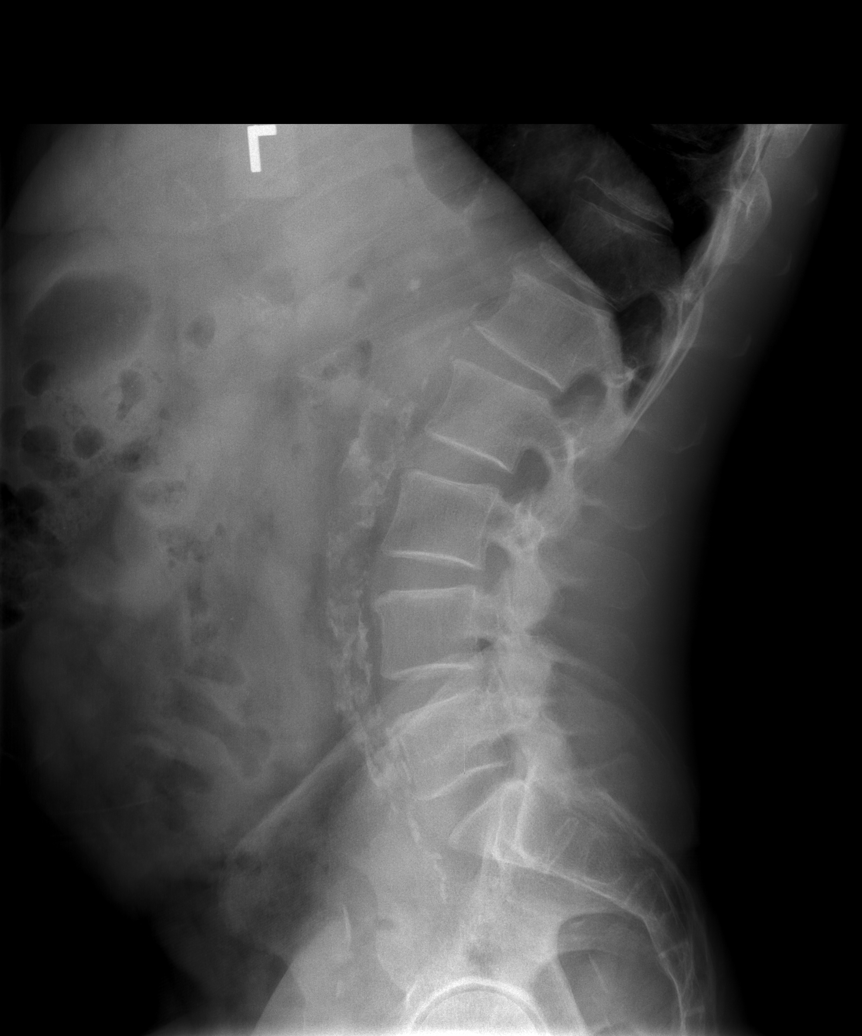

[t l-spine l5-s1 spot]
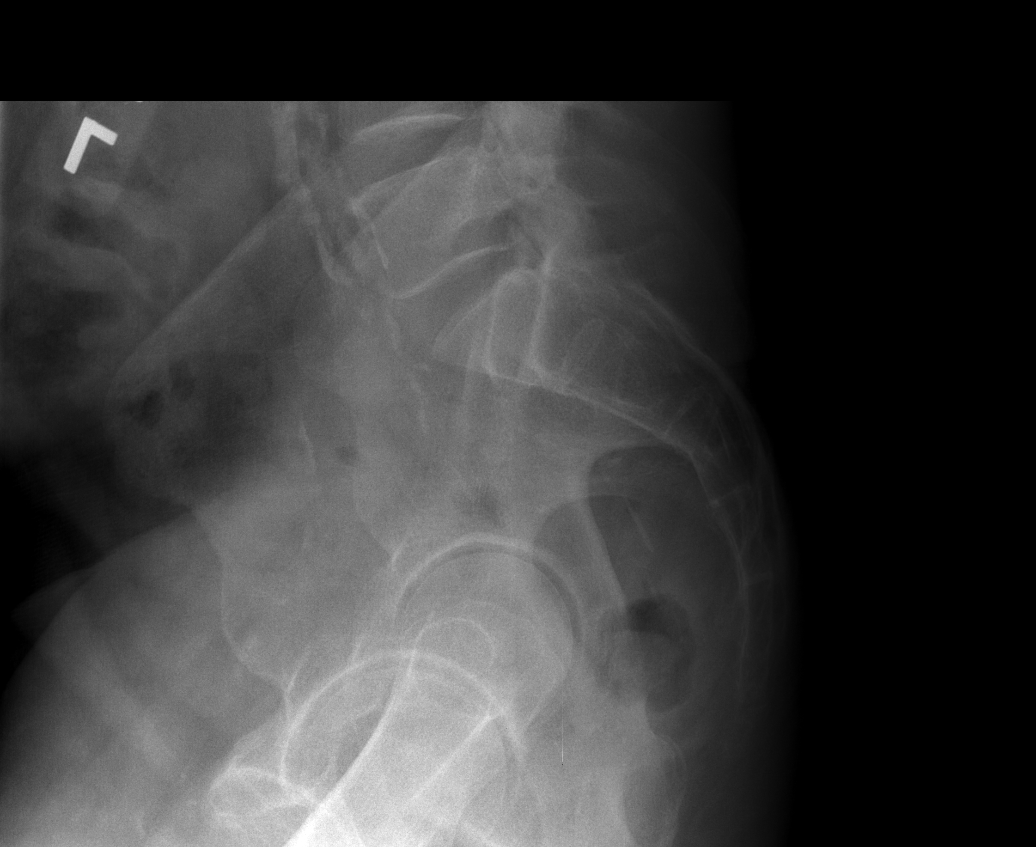

[5 of 5 positions shown; findings below may reference images not displayed]

FINDINGS: There is no evidence of lumbar spine fracture. Alignment is normal.
Intervertebral disc spaces are maintained. Paraspinous structures
demonstrate extensive atherosclerotic vascular disease.
IMPRESSION: Normal appearing lumbar spine.

Aortic Atherosclerosis ([id number]).

## 2023-04-02 DIAGNOSIS — R69 Illness, unspecified: Secondary | ICD-10-CM | POA: Diagnosis not present

## 2023-04-09 DIAGNOSIS — R69 Illness, unspecified: Secondary | ICD-10-CM | POA: Diagnosis not present

## 2023-04-14 DIAGNOSIS — R69 Illness, unspecified: Secondary | ICD-10-CM | POA: Diagnosis not present

## 2023-04-29 DIAGNOSIS — M1A9XX0 Chronic gout, unspecified, without tophus (tophi): Secondary | ICD-10-CM | POA: Diagnosis not present

## 2023-05-15 DIAGNOSIS — R69 Illness, unspecified: Secondary | ICD-10-CM | POA: Diagnosis not present

## 2023-05-20 DIAGNOSIS — Z6824 Body mass index (BMI) 24.0-24.9, adult: Secondary | ICD-10-CM | POA: Diagnosis not present

## 2023-05-20 DIAGNOSIS — F331 Major depressive disorder, recurrent, moderate: Secondary | ICD-10-CM | POA: Diagnosis not present

## 2023-05-20 DIAGNOSIS — F17210 Nicotine dependence, cigarettes, uncomplicated: Secondary | ICD-10-CM | POA: Diagnosis not present

## 2023-05-20 DIAGNOSIS — M1A9XX0 Chronic gout, unspecified, without tophus (tophi): Secondary | ICD-10-CM | POA: Diagnosis not present

## 2023-05-20 DIAGNOSIS — F411 Generalized anxiety disorder: Secondary | ICD-10-CM | POA: Diagnosis not present

## 2023-05-21 DIAGNOSIS — R69 Illness, unspecified: Secondary | ICD-10-CM | POA: Diagnosis not present

## 2023-05-26 DIAGNOSIS — R69 Illness, unspecified: Secondary | ICD-10-CM | POA: Diagnosis not present

## 2023-06-15 ENCOUNTER — Other Ambulatory Visit: Payer: Self-pay | Admitting: Physician Assistant

## 2023-06-15 DIAGNOSIS — R918 Other nonspecific abnormal finding of lung field: Secondary | ICD-10-CM

## 2023-06-15 DIAGNOSIS — U071 COVID-19: Secondary | ICD-10-CM | POA: Diagnosis not present

## 2023-08-04 DIAGNOSIS — R69 Illness, unspecified: Secondary | ICD-10-CM | POA: Diagnosis not present

## 2023-08-19 DIAGNOSIS — M533 Sacrococcygeal disorders, not elsewhere classified: Secondary | ICD-10-CM | POA: Diagnosis not present

## 2023-08-19 DIAGNOSIS — M1A9XX0 Chronic gout, unspecified, without tophus (tophi): Secondary | ICD-10-CM | POA: Diagnosis not present

## 2023-09-01 DIAGNOSIS — R69 Illness, unspecified: Secondary | ICD-10-CM | POA: Diagnosis not present

## 2023-09-03 DIAGNOSIS — M533 Sacrococcygeal disorders, not elsewhere classified: Secondary | ICD-10-CM | POA: Diagnosis not present

## 2023-09-03 DIAGNOSIS — R5383 Other fatigue: Secondary | ICD-10-CM | POA: Diagnosis not present

## 2023-09-03 DIAGNOSIS — M76899 Other specified enthesopathies of unspecified lower limb, excluding foot: Secondary | ICD-10-CM | POA: Diagnosis not present

## 2023-09-03 DIAGNOSIS — M7918 Myalgia, other site: Secondary | ICD-10-CM | POA: Diagnosis not present

## 2023-09-23 DIAGNOSIS — F331 Major depressive disorder, recurrent, moderate: Secondary | ICD-10-CM | POA: Diagnosis not present

## 2023-09-23 DIAGNOSIS — E782 Mixed hyperlipidemia: Secondary | ICD-10-CM | POA: Diagnosis not present

## 2023-09-23 DIAGNOSIS — Z6825 Body mass index (BMI) 25.0-25.9, adult: Secondary | ICD-10-CM | POA: Diagnosis not present

## 2023-09-23 DIAGNOSIS — M1A9XX0 Chronic gout, unspecified, without tophus (tophi): Secondary | ICD-10-CM | POA: Diagnosis not present

## 2023-09-23 DIAGNOSIS — F419 Anxiety disorder, unspecified: Secondary | ICD-10-CM | POA: Diagnosis not present

## 2023-10-16 DIAGNOSIS — E291 Testicular hypofunction: Secondary | ICD-10-CM | POA: Diagnosis not present

## 2023-10-24 IMAGING — CT CT CHEST W/ CM
2 of 4 series · 15 of 36 positions shown, 18 images · IV contrast (omnipaque)
Comparison: Chest radiographs 09/16/2021

CLINICAL DATA: Lung nodule. Chest pain. Abnormal chest radiograph.

EXAM:
CT CHEST WITH CONTRAST
TECHNIQUE: Multidetector CT imaging of the chest was performed during
intravenous contrast administration.
CONTRAST:  100mL OMNIPAQUE IOHEXOL 300 MG/ML  SOLN

[Series 2: axial st · axial · 0.67mm/px · z∈[-338,-66]mm · 12 of 162 slices shown, 15 images]
[im 13/162  mediastinal]
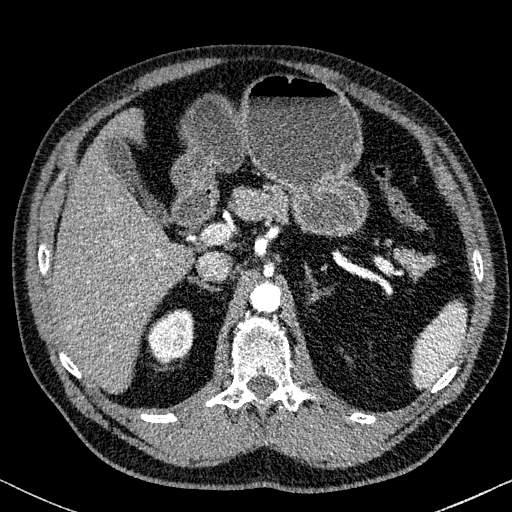
[im 13/162  lung]
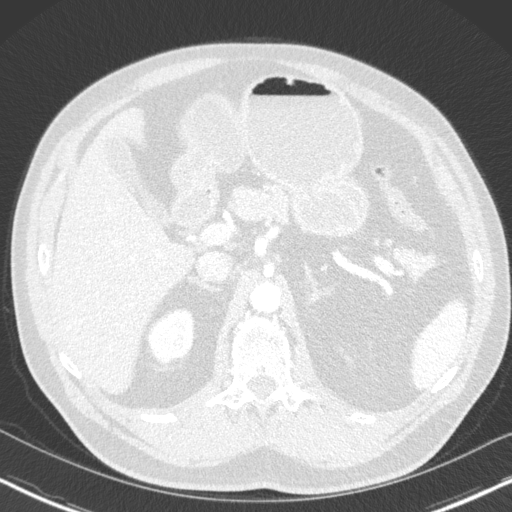
[im 25/162  lung]
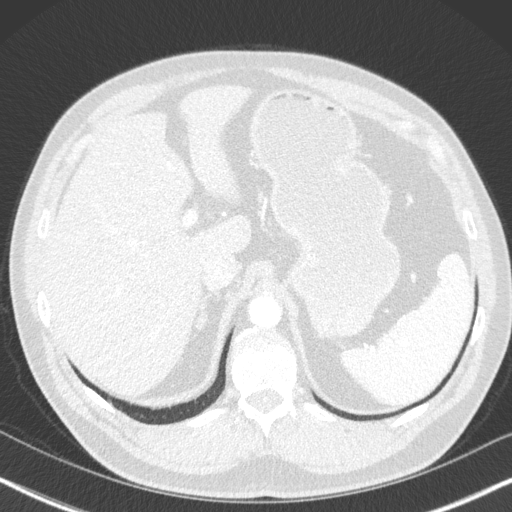
[im 38/162  lung]
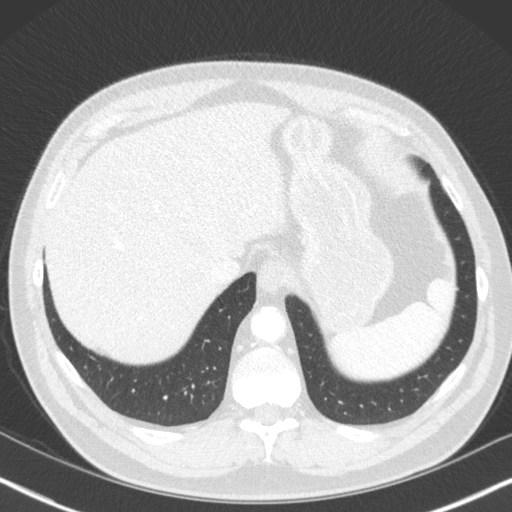
[im 50/162  lung]
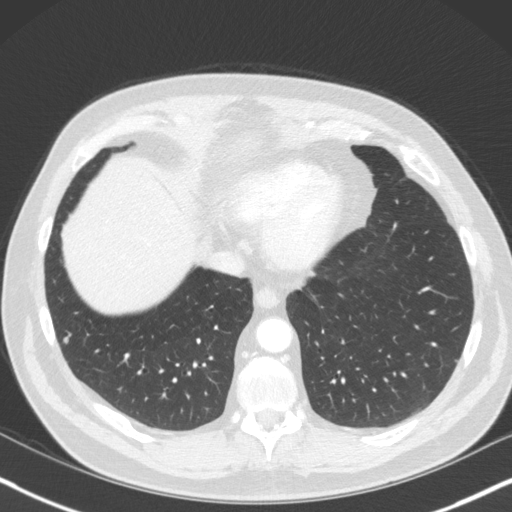
[im 62/162  mediastinal]
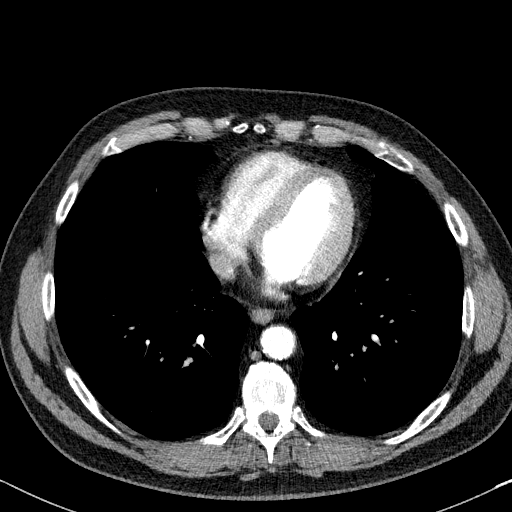
[im 62/162  lung]
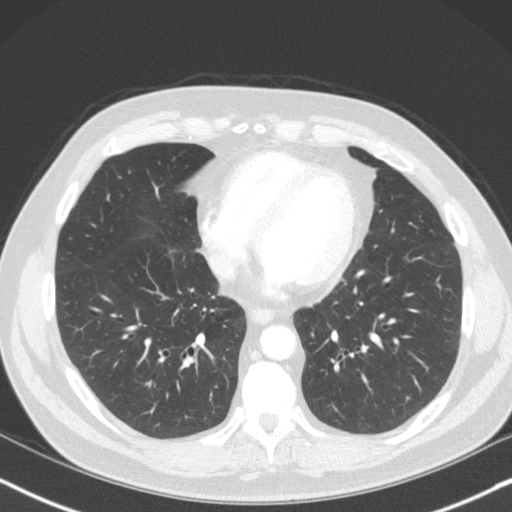
[im 75/162  lung]
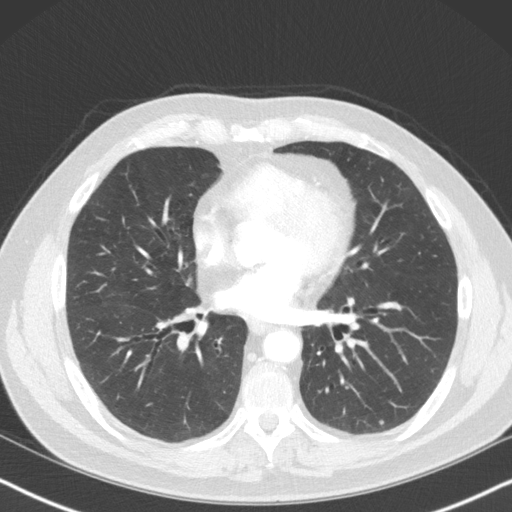
[im 87/162  lung]
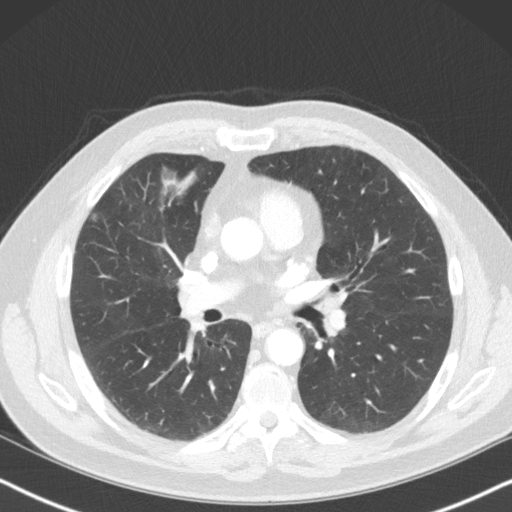
[im 100/162  lung]
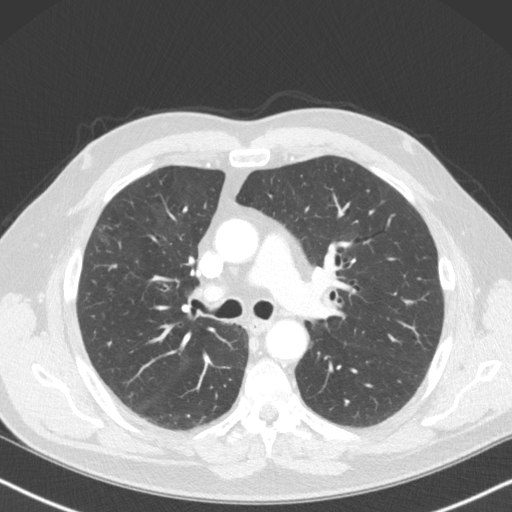
[im 112/162  mediastinal]
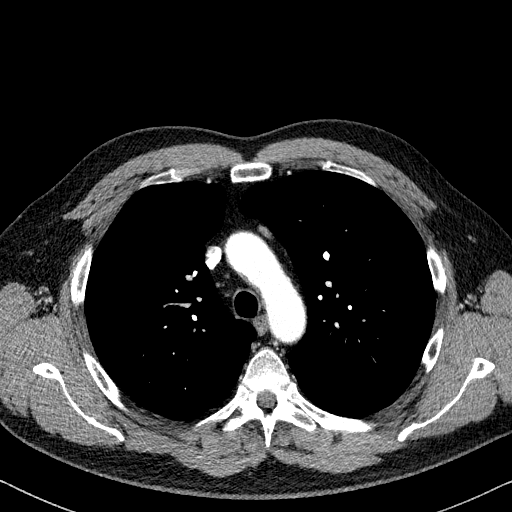
[im 112/162  lung]
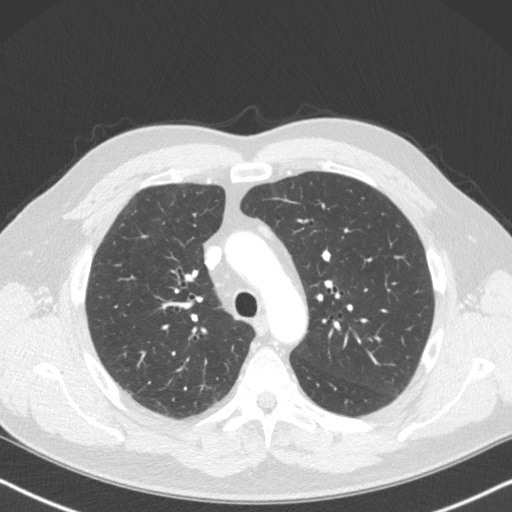
[im 124/162  lung]
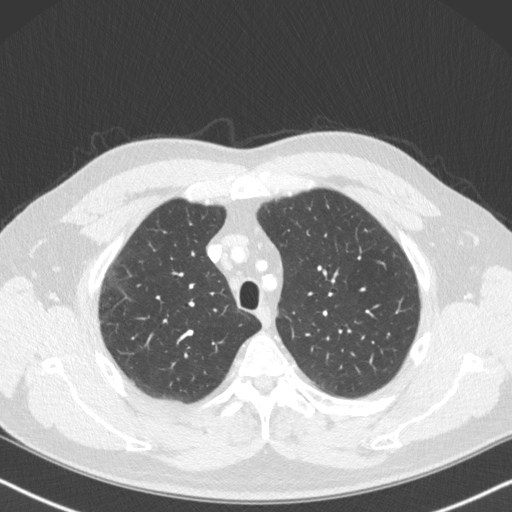
[im 137/162  lung]
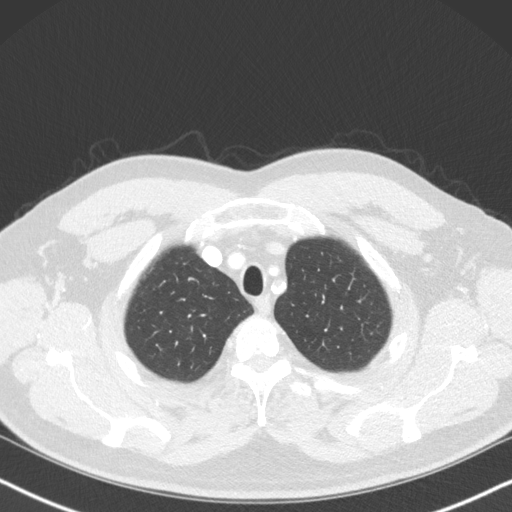
[im 149/162  lung]
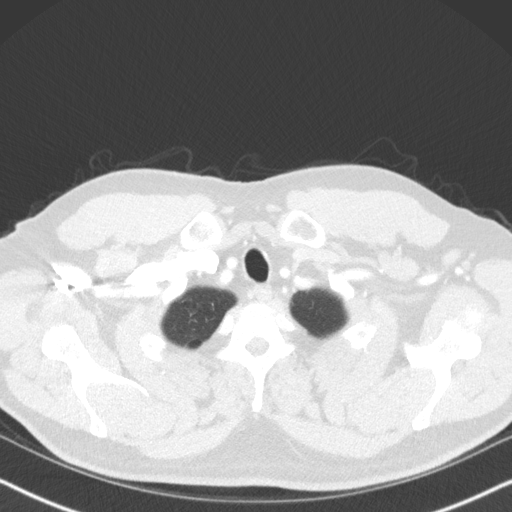

[Series 5: coronal · coronal · 0.65mm/px · 3 of 130 slices shown]
[im 26/130  lung]
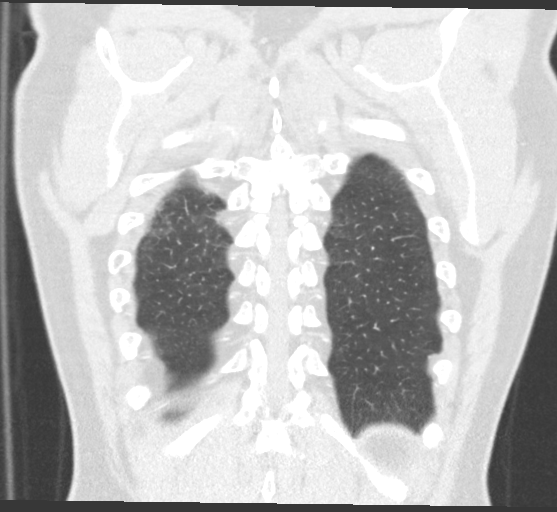
[im 52/130  lung]
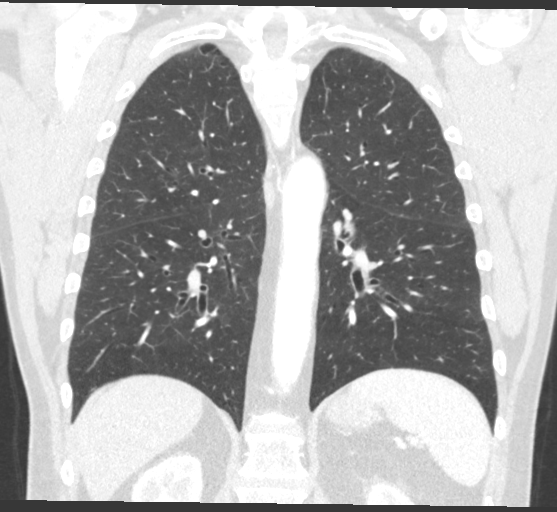
[im 78/130  lung]
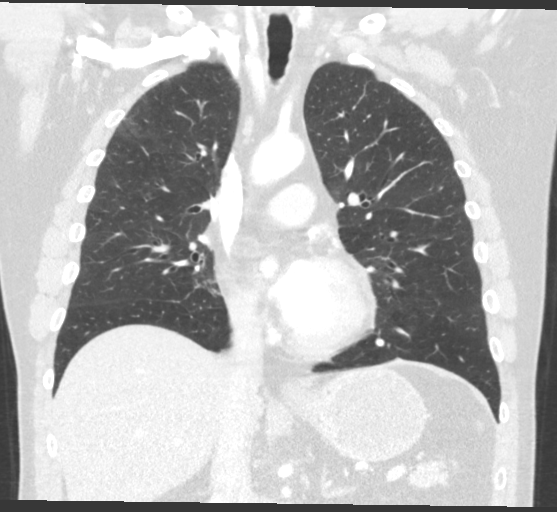

[15 of 36 positions shown; findings below may reference images not displayed]

FINDINGS: Cardiovascular: Abdominal aortic atherosclerosis without aneurysm.
LAD coronary atherosclerosis. Normal heart size. No pericardial
effusion.

Mediastinum/Nodes: Subcentimeter short axis mediastinal and hilar
lymph nodes, nonspecific but favored to be benign/reactive.
Unremarkable esophagus and included thyroid.

Lungs/Pleura: No pleural effusion or pneumothorax. 5 mm calcified
nodule in the basilar left lower lobe. Scattered noncalcified
nodules throughout both lungs measuring up to 4 mm. Focal patchy
opacity laterally in the right upper lobe corresponding to the
radiographic finding as well as curvilinear opacity along the right
minor fissure.

Upper Abdomen: No acute finding.

Musculoskeletal: No acute osseous abnormality or suspicious osseous
lesion.
IMPRESSION: 1. Focal patchy opacity laterally in the right upper lobe
corresponding to the radiographic finding, likely postinfectious or
inflammatory. Additional atelectasis or scarring along the right
minor fissure.
2. Scattered small bilateral lung nodules measuring up to 4 mm. No
follow-up needed if patient is low-risk (and has no known or
suspected primary neoplasm). Non-contrast chest CT can be considered
in 12 months if patient is high-risk. This recommendation follows
the consensus statement: Guidelines for Management of Incidental
Pulmonary Nodules Detected on CT Images: From the [HOSPITAL]
3. Aortic Atherosclerosis (X847W-DL3.3).

## 2023-11-02 DIAGNOSIS — M545 Low back pain, unspecified: Secondary | ICD-10-CM | POA: Diagnosis not present

## 2023-11-05 DIAGNOSIS — M533 Sacrococcygeal disorders, not elsewhere classified: Secondary | ICD-10-CM | POA: Diagnosis not present

## 2023-11-05 DIAGNOSIS — R4189 Other symptoms and signs involving cognitive functions and awareness: Secondary | ICD-10-CM | POA: Diagnosis not present

## 2023-11-10 ENCOUNTER — Encounter: Payer: Self-pay | Admitting: Physician Assistant

## 2023-11-11 DIAGNOSIS — M545 Low back pain, unspecified: Secondary | ICD-10-CM | POA: Diagnosis not present

## 2023-11-16 ENCOUNTER — Ambulatory Visit
Admission: RE | Admit: 2023-11-16 | Discharge: 2023-11-16 | Disposition: A | Discharge status: Home / Self Care | Payer: Medicare Other | Source: Ambulatory Visit | Attending: Physician Assistant | Admitting: Physician Assistant

## 2023-11-16 DIAGNOSIS — Z87891 Personal history of nicotine dependence: Secondary | ICD-10-CM | POA: Diagnosis not present

## 2023-11-16 DIAGNOSIS — R918 Other nonspecific abnormal finding of lung field: Secondary | ICD-10-CM

## 2023-11-20 DIAGNOSIS — M545 Low back pain, unspecified: Secondary | ICD-10-CM | POA: Diagnosis not present

## 2023-11-26 DIAGNOSIS — K080 Exfoliation of teeth due to systemic causes: Secondary | ICD-10-CM | POA: Diagnosis not present

## 2023-11-27 DIAGNOSIS — Z125 Encounter for screening for malignant neoplasm of prostate: Secondary | ICD-10-CM | POA: Diagnosis not present

## 2023-11-27 DIAGNOSIS — Z0000 Encounter for general adult medical examination without abnormal findings: Secondary | ICD-10-CM | POA: Diagnosis not present

## 2023-11-27 DIAGNOSIS — E782 Mixed hyperlipidemia: Secondary | ICD-10-CM | POA: Diagnosis not present

## 2023-11-27 DIAGNOSIS — M545 Low back pain, unspecified: Secondary | ICD-10-CM | POA: Diagnosis not present

## 2023-11-29 NOTE — Progress Notes (Signed)
 New Patient Note  RE: Harold Day MRN: [id number] DOB: February 06, 1951 Date of Office Visit: 11/30/2023  Consult requested by: Dickey Fought, PA Primary care provider: Dickey Fought, PA  Chief Complaint: Establish Care and Allergic Reaction (Allergic reaction to alpha-gal. Itchiness and throat closure. )  History of Present Illness: I had the pleasure of seeing Harold Day for initial evaluation at the [facility] on 11/30/2023. He is a 73 y.o. male, who is referred here by Dickey Fought, PA for the evaluation of alpha gal allergy.  Discussed the use of AI scribe software for clinical note transcription with the patient, who gave verbal consent to proceed.  He developed an alpha-gal allergy in December 2023 after consuming sausage and a hamburger, resulting in a severe allergic reaction with rash, hives, and throat tightness, but no diarrhea or vomiting. He was treated in the emergency department with an EpiPen  and Benadryl, which resolved his symptoms within an hour. Since then, he has avoided red meat and has not required the use of his EpiPen .  He can consume dairy products with only mild gastrointestinal symptoms, such as diarrhea and stomach discomfort, which were present even before the onset of the alpha-gal allergy. He avoids gelatin products due to his wife's prior experience with alpha-gal allergy.  [place] in [place]  and spends considerable time outdoors, which may increase his exposure to ticks. He does not routinely use tick repellent and recalls removing a tick from his body two days ago. No recent fevers, chills, or respiratory issues. Normal appetite and bowel habits.      He reports food allergy to alpha gal. The reaction occurred about 1+ year ago, after he ate sausage for breakfast and hamburger for lunch. Symptoms started by that evening and was in the form of rash, hives, throat tightness.  Denies any associated cofactors such  as exertion, infection, NSAID use, or alcohol consumption. The symptoms lasted for 1 hour. He was evaluated in ED and received epi, benadryl. Since this episode, he does not report other accidental exposures to red meat. He does have access to epinephrine  autoinjector and not needed to use it.   Past work up includes: 2023 bloodwork positive to alpha gal. Patient does not recall being bit by a tick but [place] and is outdoors a lot.  Dietary History: patient has been eating other foods including milk, eggs, peanut, treenuts, sesame, shellfish, fish, soy, wheat, meats, fruits and vegetables.  Component     Latest Ref Rng 09/29/2022  IgE (Immunoglobulin E), Serum     6 - 495 IU/mL 551 (H)   [id number] Alpha-Gal     Class V kU/L 89.00 !   Beef IgE     Class V kU/L 27.80 !   Pork IgE     Class IV kU/L 13.10 !   Allergen Lamb IgE     Class IV kU/L 18.90 !     09/27/2022 ER visit: "Patient presents with rash and chest pain. Began this evening. States he was just watching golf. He can do it on his thigh and then noticed rash all over. He can have some left-sided chest pain. Felt a little short of breath. Had taken Benadryl 50 mg at home and went to fire department was given epinephrine . Came in by EMS. No known allergic reactions. No known cardiac history.   Patient with a rash.  Hives.  Had some chest pain and nausea.  Has been given Pepcid and Benadryl.  Got EpiPen  by EMS.  Developed some chest pain today given aspirin nitroglycerin .  Differential diagnosis includes allergic reaction.  Doubt cardiac ischemia.  EKG reassuring.  Troponin negative.  Chest pain preceded getting the epinephrine .  Felt better after the epinephrine .  Will continue to monitor.  Will likely discharge with short course of steroids and outpatient follow-up. Of note patient's wife has an alpha gal allergy and patient is worried that he could have the same.  He did eat a hamburger about 5 hours prior to the symptoms  starting.   Patient is been monitored for around 3 hours.  Continues to to feel good without worsening again.  I think.  Stable for discharge home.  Discharge home with plan for steroid tomorrow and given EpiPen .  Follow-up with PCP or allergy.  Discharge home"  Assessment and Plan: Harold Day is a 73 y.o. 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 No recent reactions. Last alpha-gal IgE level was high (89). No recent tick bites. Patient has been avoiding red meat and gelatin products. Mild reactions to dairy products have improved. Continue strict avoidance of all mammalian meat. I have prescribed epinephrine  injectable device and demonstrated proper use. For mild symptoms you can take over the counter antihistamines such as Benadryl 1-2 tablets = 25-50mg  and monitor symptoms closely. If symptoms worsen or if you have severe symptoms including breathing issues, throat closure, significant swelling, whole body hives, severe diarrhea and vomiting, lightheadedness then inject epinephrine  and seek immediate medical care afterwards. Emergency action plan given. Get bloodwork. Gave handout on alpha-gal allergy. Avoid tick bites.  Return in about 1 year (around 11/29/2024).  Meds ordered this encounter  Medications   EPINEPHrine  0.3 mg/0.3 mL IJ SOAJ injection    Sig: Inject 0.3 mg into the muscle as needed for anaphylaxis.    Dispense:  2 each    Refill:  1    May dispense generic/Mylan/Teva brand.   Lab Orders         Alpha-Gal Panel      Other allergy screening: Asthma: no Rhino conjunctivitis: no Medication allergy:  Allopurinol  - rash Hymenoptera allergy: no Urticaria: no Eczema:no History of recurrent infections suggestive of immunodeficency: no  Diagnostics: None.  Past Medical History: Patient Active Problem List   Diagnosis Date Noted   Allergic  reaction to alpha-gal 10/02/2022   Screening for malignant neoplasm of prostate 11/15/2021   Low testosterone  09/16/2021   Lumbar radiculopathy 07/10/2021   Aortic atherosclerosis (HCC) 02/25/2021   Tubular adenoma of colon 11/30/2020   Lactose intolerance 11/28/2019   History of gout 11/28/2019   Benign essential tremor 08/20/2017   Seborrheic keratosis 07/17/2016   Recovering alcoholic (HCC) 07/17/2016   BPH (benign prostatic hyperplasia) 03/18/2016   Depression 03/18/2016   Hx of colonic polyps 03/08/2012   GERD (gastroesophageal reflux disease) 03/08/2012   History of hepatitis C 03/08/2012   Perennial allergic rhinitis with seasonal variation 03/08/2012   Past Medical History:  Diagnosis Date   Alcoholic (HCC)    Allergic rhinitis    Depression    Former smoker    quit 2011   GERD (gastroesophageal reflux disease)    Hepatitis C    noticed when he tried to give blood; never had illness   Past Surgical History: Past Surgical History:  Procedure Laterality Date   38 HOUR PH STUDY  N/A 09/18/2014   Procedure: 24 HOUR PH STUDY;  Surgeon: Almeda Aris, MD;  Location: [facility];  Service: Endoscopy;  Laterality: N/A;   COLONOSCOPY     ESOPHAGEAL MANOMETRY N/A 09/18/2014   Procedure: ESOPHAGEAL MANOMETRY (EM);  Surgeon: Almeda Aris, MD;  Location: [facility];  Service: Endoscopy;  Laterality: N/A;   SHOULDER ARTHROSCOPY Left 04/2017   VASECTOMY     Medication List:  Current Outpatient Medications  Medication Sig Dispense Refill   ALPRAZolam  (XANAX ) 0.25 MG tablet Take 0.25 mg by mouth daily as needed.     aspirin 81 MG tablet Take 81 mg by mouth daily.     atorvastatin  (LIPITOR) 20 MG tablet TAKE 1 TABLET(20 MG) BY MOUTH DAILY 90 tablet 1   buPROPion  (WELLBUTRIN  XL) 150 MG 24 hr tablet Take 150 mg by mouth every morning.     colchicine  0.6 MG tablet Take 1 tablet (0.6 mg total) by mouth daily. 5 tablet 0   DULoxetine  (CYMBALTA ) 30 MG capsule TAKE 1 CAPSULE(30  MG) BY MOUTH DAILY 30 capsule 0   EPINEPHrine  0.3 mg/0.3 mL IJ SOAJ injection Inject 0.3 mg into the muscle as needed for anaphylaxis. 2 each 1   esomeprazole  (NEXIUM ) 20 MG capsule Take 20 mg by mouth daily at 12 noon.     febuxostat (ULORIC) 40 MG tablet Take 40 mg by mouth daily.     fish oil-omega-3 fatty acids 1000 MG capsule Take 2 g by mouth daily.     ibuprofen (ADVIL,MOTRIN) 200 MG tablet Take 600 mg by mouth every 4 (four) hours.     loperamide  (IMODIUM  A-D) 2 MG tablet Take 1 tablet (2 mg total) by mouth at bedtime as needed for diarrhea or loose stools. 12 tablet 0   Multiple Vitamins-Minerals (MULTIVITAMIN WITH MINERALS) tablet Take 1 tablet by mouth daily.     promethazine  (PHENERGAN ) 25 MG tablet Take 1 tablet (25 mg total) by mouth every 6 (six) hours as needed for nausea or vomiting. 20 tablet 0   tamsulosin (FLOMAX) 0.4 MG CAPS capsule Take 0.4 mg by mouth daily.     Testosterone  (ANDROGEL  PUMP) 20.25 MG/ACT (1.62%) GEL Place 2 application  onto the skin daily. 75 g 5   traMADol  (ULTRAM ) 50 MG tablet Take 1 tablet (50 mg total) by mouth 3 (three) times daily as needed. 30 tablet 0   triamcinolone  (NASACORT  AQ) 55 MCG/ACT nasal inhaler Place 2 sprays into the nose daily. 1 Inhaler 12   No current facility-administered medications for this visit.   Allergies: Allergies  Allergen Reactions   Allopurinol      rash   Alpha-Gal    Lactose Intolerance (Gi)    Social History: Social History   Socioeconomic History   Marital status: Married    Spouse name: Not on file   Number of children: 2   Years of education: Not on file   Highest education level: Not on file  Occupational History   Occupation: retired (tobacco buyer)    Employer: [employer]  Tobacco Use   Smoking status: Former   Smokeless tobacco: Current    Types: Snuff  Vaping Use   Vaping status: Former  Substance and Sexual Activity   Alcohol use: No    Comment: no alcohol since about 2000    Drug use: No   Sexual activity: Yes  Other Topics Concern   Not on file  Social History Narrative   Not on file   Social Drivers of  Health   Financial Resource Strain: Low Risk  (11/22/2021)   Overall Financial Resource Strain (CARDIA)    Difficulty of Paying Living Expenses: Not hard at all  Food Insecurity: No Food Insecurity (11/22/2021)   Hunger Vital Sign    Worried About Running Out of Food in the Last Year: Never true    Ran Out of Food in the Last Year: Never true  Transportation Needs: No Transportation Needs (11/22/2021)   PRAPARE - Administrator, Civil Service (Medical): No    Lack of Transportation (Non-Medical): No  Physical Activity: Insufficiently Active (11/22/2021)   Exercise Vital Sign    Days of Exercise per Week: 4 days    Minutes of Exercise per Session: 20 min  Stress: No Stress Concern Present (11/22/2021)   [employer] of Occupational Health - Occupational Stress Questionnaire    Feeling of Stress : Not at all  Social Connections: Not on file   Lives in a house. Smoking: denies Occupation: retired  Landscape architect HistorySurveyor, minerals in the house: no Engineer, civil (consulting) in the family room: no Carpet in the bedroom: yes Heating: electric Cooling: central Pet: yes 2 dogs  Family History: Family History  Problem Relation Age of Onset   Lung cancer Mother    Allergic rhinitis Son    Autism Son    Other Neg Hx    Review of Systems  Constitutional:  Negative for appetite change, chills, fever and unexpected weight change.  HENT:  Negative for congestion and rhinorrhea.   Eyes:  Negative for itching.  Respiratory:  Negative for cough, chest tightness, shortness of breath and wheezing.   Cardiovascular:  Negative for chest pain.  Gastrointestinal:  Negative for abdominal pain.  Genitourinary:  Negative for difficulty urinating.  Skin:  Negative for rash.  Allergic/Immunologic: Positive for food allergies.  Neurological:  Negative for  headaches.    Objective: BP 130/76 (BP Location: Right Arm, Patient Position: Sitting, Cuff Size: Normal)   Pulse 70   Temp 97.6 F (36.4 C) (Temporal)   Resp 18   Ht 5' 7.25" (1.708 m)   Wt 160 lb 8 oz (72.8 kg)   SpO2 100%   BMI 24.95 kg/m  Body mass index is 24.95 kg/m. Physical Exam Vitals and nursing note reviewed.  Constitutional:      Appearance: Normal appearance. He is well-developed.  HENT:     Head: Normocephalic and atraumatic.     Right Ear: Tympanic membrane and external ear normal.     Left Ear: Tympanic membrane and external ear normal.     Nose: Nose normal.     Mouth/Throat:     Mouth: Mucous membranes are moist.     Pharynx: Oropharynx is clear.  Eyes:     Conjunctiva/sclera: Conjunctivae normal.  Cardiovascular:     Rate and Rhythm: Normal rate and regular rhythm.     Heart sounds: Normal heart sounds. No murmur heard.    No friction rub. No gallop.  Pulmonary:     Effort: Pulmonary effort is normal.     Breath sounds: Normal breath sounds. No wheezing, rhonchi or rales.  Musculoskeletal:     Cervical back: Neck supple.  Skin:    General: Skin is warm.     Findings: No rash.  Neurological:     Mental Status: He is alert and oriented to person, place, and time.  Psychiatric:        Behavior: Behavior normal.   The plan was reviewed with the  patient/family, and all questions/concerned were addressed.  It was my pleasure to see Harold Day today and participate in his care. Please feel free to contact me with any questions or concerns.  Sincerely,  Eudelia Hero, DO Allergy & Immunology  [facility] office: [phone] [facility] office: [phone]

## 2023-11-30 ENCOUNTER — Encounter: Payer: Self-pay | Admitting: Allergy

## 2023-11-30 ENCOUNTER — Ambulatory Visit: Payer: Medicare Other | Admitting: Allergy

## 2023-11-30 ENCOUNTER — Other Ambulatory Visit: Payer: Self-pay

## 2023-11-30 VITALS — BP 130/76 | HR 70 | Temp 97.6°F | Resp 18 | Ht 67.25 in | Wt 160.5 lb

## 2023-11-30 DIAGNOSIS — T7800XD Anaphylactic reaction due to unspecified food, subsequent encounter: Secondary | ICD-10-CM | POA: Diagnosis not present

## 2023-11-30 DIAGNOSIS — Z91018 Allergy to other foods: Secondary | ICD-10-CM | POA: Diagnosis not present

## 2023-11-30 MED ORDER — EPINEPHRINE 0.3 MG/0.3ML IJ SOAJ: 0.3000 mg | INTRAMUSCULAR | 1 refills | Status: AC | PRN

## 2023-11-30 NOTE — Patient Instructions (Addendum)
 Alpha-gal allergy  Continue strict avoidance of all mammalian meat. I have prescribed epinephrine  injectable device and demonstrated proper use. For mild symptoms you can take over the counter antihistamines such as Benadryl 1-2 tablets = 25-50mg  and monitor symptoms closely. If symptoms worsen or if you have severe symptoms including breathing issues, throat closure, significant swelling, whole body hives, severe diarrhea and vomiting, lightheadedness then inject epinephrine  and seek immediate medical care afterwards. Emergency action plan given.  Get bloodwork We are ordering labs, so please allow 1-2 weeks for the results to come back. With the newly implemented Cures Act, the labs might be visible to you at the same time that they become visible to me. However, I will not address the results until all of the results are back, so please be patient.  In the meantime, continue recommendations in your patient instructions, including avoidance measures (if applicable), until you hear from me.  Follow up in 1 year or sooner if needed.   Alpha-gal and Red Meat Allergy   Overview An allergy to "alpha-gal" refers to having a severe and potentially life-threatening allergy to a carbohydrate molecule called galactose-alpha-1,3-galactose that is found in most mammalian or "red meat". Unlike other food allergies which typically occur within minutes of ingestion, symptoms from eating red meat such as pork, lamb or beef may be delayed, occurring 3-8 hours after eating. Most food allergies are directed against a protein molecule, but alpha-gal is unusual because it is a carbohydrate, and a delay in its absorption may explain the delay in symptoms.  Helpful website: [employer]ikerFestival.is  What are the symptoms of an alpha-gal allergy? As with other food allergies, signs or symptoms of an allergy to alpha-gal may include: Hives and itching  Swelling of your lips, face or eyelids  Shortness of breath,  cough or wheezing  Abdominal pain, nausea, diarrhea or vomiting The most severe reaction, anaphylaxis, can present as a combination of several of these symptoms, may include low blood pressure, and is potentially fatal.  Because these symptoms are delayed, you may only wake up with them in the middle of the night after an evening meal.  How is an alpha-gal allergy diagnosed? Diagnosis of this allergy starts with your allergist taking an appropriate history and physical examination. Because the onset is usually quite delayed, it can be hard to associate the symptoms with eating red meat many hours previously. Triggers include any red meat - including beef, pork, lamb or even horse products. It may occur after eating hotdogs and hamburgers. In very rare cases the reaction may extend to milk or dairy proteins and gelatin.  Your allergist may recommend testing that includes skin tests to the relevant animal proteins and blood tests which measure the levels of a specific immunoglobulin E (IgE) antibody, to mammalian meats. An investigational blood test, IgE against alpha-gal itself, may also aid in the diagnosis.  How is an alpha-gal allergy treated? Immediate symptoms such as hives or shortness of breath are treated the same as any other food allergy - in an urgent care setting with anti-histamines, epinephrine  and other medications. Prevention long-term involves avoidance of all red meat in sensitized individuals. You may be advised to carry an epinephrine  auto-injector, to be used in case of subsequent accidental exposures and reaction. These measures do not necessarily mean switching to a full vegetarian diet, since poultry and fish can be consumed and do not cause similar reactions. As with other food allergies, there is the possibility that over time the sensitivity  diminishes - although these changes may take many years to become apparent.  How do you become allergic to alpha-gal? Alpha-gal is a  molecule carried in the saliva of the [place] tick and other potential arthropods typically after feeding on mammalian blood. People that are bitten by the tick, especially those that are bitten repeatedly, are at risk of becoming sensitized and producing the IgE necessary to then cause allergic reactions. Interestingly, allergic reactions may occur to red meat, to subsequent tick bites, and even to medications that contain alpha-gal. Cetuximab is a cancer medication that contains alpha-gal, and people who have had allergic reactions to this medication (these are typically immediate reactions, because it is infused intravenously) have a higher risk for red meat allergy and are likely to have been bitten by ticks in the past. As might be expected, the incidence of tick bites is much higher in the [place] the traditional habitat for the tick. However, cases are now increasingly reported in the [place] and [place] states. And it is a phenomenon that has been observed worldwide, with different ticks responsible for similar cases of red meat allergy in many other countries such as [place], [place] and [place].  The discovery of this peculiar allergy has allowed researchers to correlate tick bites with many cases of anaphylaxis that would previously have been classified as 'idiopathic', or of unknown cause. Also, while it was originally thought that the [employer] tick had to feast on mammalian blood in order to carry the alpha-gal molecule, more recent research has shown that it may carry this molecule and be capable of sensitizing humans independently.  How do you prevent an alpha-gal allergy? Because this allergy is predominantly tick born, you are more likely at risk if you often go outdoors in wooded areas for activities such as hiking, fishing or hunting. The key strategy is to prevent tick bites. This may include wearing long sleeved shirts or pants, using appropriate insect  repellants, and surveying for ticks after spending time outdoors. Any observed ticks should be removed carefully by cleaning the site with rubbing alcohol, then using tweezers to pull the tick's head up carefully from the skin using steady pressure. Clean your hands and the site one more time and make sure not to crush the tick between your fingers.

## 2023-12-02 ENCOUNTER — Encounter: Payer: Self-pay | Admitting: Allergy

## 2023-12-02 LAB — ALPHA-GAL PANEL
Allergen Lamb IgE: 2.32 kU/L — AB
Beef IgE: 3.72 kU/L — AB
IgE (Immunoglobulin E), Serum: 103 [IU]/mL (ref 6–495)
O215-IgE Alpha-Gal: 5.91 kU/L — AB
Pork IgE: 1.35 kU/L — AB

## 2023-12-04 DIAGNOSIS — M545 Low back pain, unspecified: Secondary | ICD-10-CM | POA: Diagnosis not present

## 2023-12-11 DIAGNOSIS — M545 Low back pain, unspecified: Secondary | ICD-10-CM | POA: Diagnosis not present

## 2023-12-17 DIAGNOSIS — K080 Exfoliation of teeth due to systemic causes: Secondary | ICD-10-CM | POA: Diagnosis not present

## 2023-12-18 DIAGNOSIS — R972 Elevated prostate specific antigen [PSA]: Secondary | ICD-10-CM | POA: Diagnosis not present

## 2023-12-18 DIAGNOSIS — M545 Low back pain, unspecified: Secondary | ICD-10-CM | POA: Diagnosis not present

## 2023-12-23 DIAGNOSIS — M7918 Myalgia, other site: Secondary | ICD-10-CM | POA: Diagnosis not present

## 2023-12-23 DIAGNOSIS — M533 Sacrococcygeal disorders, not elsewhere classified: Secondary | ICD-10-CM | POA: Diagnosis not present

## 2023-12-23 DIAGNOSIS — R4189 Other symptoms and signs involving cognitive functions and awareness: Secondary | ICD-10-CM | POA: Diagnosis not present

## 2023-12-31 DIAGNOSIS — K080 Exfoliation of teeth due to systemic causes: Secondary | ICD-10-CM | POA: Diagnosis not present

## 2024-01-01 DIAGNOSIS — M545 Low back pain, unspecified: Secondary | ICD-10-CM | POA: Diagnosis not present

## 2024-01-08 DIAGNOSIS — M545 Low back pain, unspecified: Secondary | ICD-10-CM | POA: Diagnosis not present

## 2024-01-15 DIAGNOSIS — M545 Low back pain, unspecified: Secondary | ICD-10-CM | POA: Diagnosis not present

## 2024-01-21 DIAGNOSIS — M1A9XX0 Chronic gout, unspecified, without tophus (tophi): Secondary | ICD-10-CM | POA: Diagnosis not present

## 2024-01-21 DIAGNOSIS — F17201 Nicotine dependence, unspecified, in remission: Secondary | ICD-10-CM | POA: Diagnosis not present

## 2024-01-21 DIAGNOSIS — F331 Major depressive disorder, recurrent, moderate: Secondary | ICD-10-CM | POA: Diagnosis not present

## 2024-01-21 DIAGNOSIS — E291 Testicular hypofunction: Secondary | ICD-10-CM | POA: Diagnosis not present

## 2024-01-22 DIAGNOSIS — M545 Low back pain, unspecified: Secondary | ICD-10-CM | POA: Diagnosis not present

## 2024-01-29 DIAGNOSIS — M545 Low back pain, unspecified: Secondary | ICD-10-CM | POA: Diagnosis not present

## 2024-02-12 DIAGNOSIS — M545 Low back pain, unspecified: Secondary | ICD-10-CM | POA: Diagnosis not present

## 2024-02-19 DIAGNOSIS — M545 Low back pain, unspecified: Secondary | ICD-10-CM | POA: Diagnosis not present

## 2024-02-26 DIAGNOSIS — M545 Low back pain, unspecified: Secondary | ICD-10-CM | POA: Diagnosis not present

## 2024-03-10 DIAGNOSIS — M79672 Pain in left foot: Secondary | ICD-10-CM | POA: Diagnosis not present

## 2024-03-10 DIAGNOSIS — M109 Gout, unspecified: Secondary | ICD-10-CM | POA: Diagnosis not present

## 2024-03-22 DIAGNOSIS — M533 Sacrococcygeal disorders, not elsewhere classified: Secondary | ICD-10-CM | POA: Diagnosis not present

## 2024-05-05 DIAGNOSIS — E291 Testicular hypofunction: Secondary | ICD-10-CM | POA: Diagnosis not present

## 2024-06-02 DIAGNOSIS — R0602 Shortness of breath: Secondary | ICD-10-CM | POA: Diagnosis not present

## 2024-06-06 ENCOUNTER — Emergency Department (HOSPITAL_BASED_OUTPATIENT_CLINIC_OR_DEPARTMENT_OTHER): Admission: EM | Admit: 2024-06-06 | Discharge: 2024-06-06 | Disposition: A | Discharge status: Home / Self Care

## 2024-06-06 ENCOUNTER — Other Ambulatory Visit: Payer: Self-pay

## 2024-06-06 ENCOUNTER — Emergency Department (HOSPITAL_BASED_OUTPATIENT_CLINIC_OR_DEPARTMENT_OTHER)

## 2024-06-06 ENCOUNTER — Encounter (HOSPITAL_BASED_OUTPATIENT_CLINIC_OR_DEPARTMENT_OTHER): Payer: Self-pay

## 2024-06-06 DIAGNOSIS — Z7982 Long term (current) use of aspirin: Secondary | ICD-10-CM | POA: Insufficient documentation

## 2024-06-06 DIAGNOSIS — F17200 Nicotine dependence, unspecified, uncomplicated: Secondary | ICD-10-CM | POA: Diagnosis not present

## 2024-06-06 DIAGNOSIS — I10 Essential (primary) hypertension: Secondary | ICD-10-CM | POA: Diagnosis not present

## 2024-06-06 DIAGNOSIS — R918 Other nonspecific abnormal finding of lung field: Secondary | ICD-10-CM | POA: Diagnosis not present

## 2024-06-06 DIAGNOSIS — R0609 Other forms of dyspnea: Secondary | ICD-10-CM | POA: Diagnosis not present

## 2024-06-06 DIAGNOSIS — Z79899 Other long term (current) drug therapy: Secondary | ICD-10-CM | POA: Insufficient documentation

## 2024-06-06 DIAGNOSIS — R0602 Shortness of breath: Secondary | ICD-10-CM | POA: Diagnosis not present

## 2024-06-06 LAB — BASIC METABOLIC PANEL WITH GFR
Anion gap: 11 (ref 5–15)
BUN: 14 mg/dL (ref 8–23)
CO2: 24 mmol/L (ref 22–32)
Calcium: 9.3 mg/dL (ref 8.9–10.3)
Chloride: 106 mmol/L (ref 98–111)
Creatinine, Ser: 1.14 mg/dL (ref 0.61–1.24)
GFR, Estimated: >60 mL/min (ref >60)
Glucose, Bld: 173 mg/dL — ABNORMAL HIGH (ref 70–99)
Potassium: 4 mmol/L (ref 3.5–5.1)
Sodium: 140 mmol/L (ref 135–145)

## 2024-06-06 LAB — CBC WITH DIFFERENTIAL/PLATELET
Abs Immature Granulocytes: 0.04 K/uL (ref 0.00–0.07)
Basophils Absolute: 0.1 K/uL (ref 0.0–0.1)
Basophils Relative: 1 %
Eosinophils Absolute: 0.2 K/uL (ref 0.0–0.5)
Eosinophils Relative: 2 %
HCT: 39.2 % (ref 39.0–52.0)
Hemoglobin: 13.1 g/dL (ref 13.0–17.0)
Immature Granulocytes: 1 %
Lymphocytes Relative: 27 %
Lymphs Abs: 1.9 K/uL (ref 0.7–4.0)
MCH: 30.1 pg (ref 26.0–34.0)
MCHC: 33.4 g/dL (ref 30.0–36.0)
MCV: 90.1 fL (ref 80.0–100.0)
Monocytes Absolute: 0.5 K/uL (ref 0.1–1.0)
Monocytes Relative: 7 %
Neutro Abs: 4.3 K/uL (ref 1.7–7.7)
Neutrophils Relative %: 62 %
Platelets: 286 K/uL (ref 150–400)
RBC: 4.35 MIL/uL (ref 4.22–5.81)
RDW: 13 % (ref 11.5–15.5)
WBC: 6.9 K/uL (ref 4.0–10.5)
nRBC: 0 % (ref 0.0–0.2)

## 2024-06-06 LAB — PRO BRAIN NATRIURETIC PEPTIDE: Pro Brain Natriuretic Peptide: 105 pg/mL (ref <300.0)

## 2024-06-06 LAB — TROPONIN T, HIGH SENSITIVITY: Troponin T High Sensitivity: <15 ng/L (ref 0–19)

## 2024-06-06 MED ORDER — IPRATROPIUM-ALBUTEROL 0.5-2.5 (3) MG/3ML IN SOLN
3.0000 mL | Freq: Once | RESPIRATORY_TRACT | Status: AC
  Administered 2024-06-06: 3 mL via RESPIRATORY_TRACT
  Filled 2024-06-06: qty 3

## 2024-06-06 MED ORDER — ALBUTEROL SULFATE HFA 108 (90 BASE) MCG/ACT IN AERS: 1.0000 | INHALATION_SPRAY | Freq: Four times a day (QID) | RESPIRATORY_TRACT | 0 refills | Status: DC | PRN

## 2024-06-06 NOTE — Discharge Instructions (Signed)
 Your workup today was reassuring.  Please try the albuterol  as needed for the shortness of breath see if this helps.  Please follow-up with your primary care provider as they will likely want to do some pulmonary function testing as an outpatient.  Return to the emergency department for worsening symptoms.

## 2024-06-06 NOTE — ED Notes (Signed)
 Pt alert and oriented X 4 at the time of discharge. RR even and unlabored. No acute distress noted. Pt verbalized understanding of discharge instructions as discussed. Pt ambulatory to lobby at time of discharge.

## 2024-06-06 NOTE — ED Provider Notes (Signed)
 [place] [facility] Provider Note   CSN: [id number] Arrival date & time: 06/06/24  9194     Patient presents with: Shortness of Breath   Harold RHETT is a 73 y.o. male.   73 year old male with past medical history of hypertension, hyperlipidemia, GERD, and remote history of tobacco abuse presenting to the emergency department today with shortness of breath.  Patient states that he has been short of breath now for the past 2 weeks or so.  States that he becomes short of breath with exertion.  Reports that it takes a while for him to get back to normal afterwards.  The patient denies any associated chest pain.  Denies any blood in his stool or dark stools.  He has not had any wheezing, fevers, or cough.  He came to the ER today due to these ongoing symptoms.   Shortness of Breath      Prior to Admission medications   Medication Sig Start Date End Date Taking? Authorizing Provider  albuterol  (VENTOLIN  HFA) 108 (90 Base) MCG/ACT inhaler Inhale 1-2 puffs into the lungs every 6 (six) hours as needed for wheezing or shortness of breath. 06/06/24  Yes Ula Prentice SAUNDERS, MD  ALPRAZolam  (XANAX ) 0.25 MG tablet Take 0.25 mg by mouth daily as needed. 08/19/23   [provider]  aspirin 81 MG tablet Take 81 mg by mouth daily.    [provider]  atorvastatin  (LIPITOR) 20 MG tablet TAKE 1 TABLET(20 MG) BY MOUTH DAILY 11/15/21   Joyce Norleen BROCKS, MD  buPROPion  (WELLBUTRIN  XL) 150 MG 24 hr tablet Take 150 mg by mouth every morning. 11/05/23   [provider]  colchicine  0.6 MG tablet Take 1 tablet (0.6 mg total) by mouth daily. 09/05/21   Claudene Arthea HERO, DO  DULoxetine  (CYMBALTA ) 30 MG capsule TAKE 1 CAPSULE(30 MG) BY MOUTH DAILY 10/15/22   Joyce Norleen BROCKS, MD  EPINEPHrine  0.3 mg/0.3 mL IJ SOAJ injection Inject 0.3 mg into the muscle as needed for anaphylaxis. 11/30/23   Luke Orlan HERO, DO  esomeprazole  (NEXIUM ) 20 MG capsule Take 20 mg by  mouth daily at 12 noon.    [provider]  febuxostat (ULORIC) 40 MG tablet Take 40 mg by mouth daily. 11/28/23   [provider]  fish oil-omega-3 fatty acids 1000 MG capsule Take 2 g by mouth daily.    [provider]  ibuprofen (ADVIL,MOTRIN) 200 MG tablet Take 600 mg by mouth every 4 (four) hours.    [provider]  loperamide  (IMODIUM  A-D) 2 MG tablet Take 1 tablet (2 mg total) by mouth at bedtime as needed for diarrhea or loose stools. 02/23/20   Tysinger, Alm RAMAN, PA-C  Multiple Vitamins-Minerals (MULTIVITAMIN WITH MINERALS) tablet Take 1 tablet by mouth daily.    [provider]  promethazine  (PHENERGAN ) 25 MG tablet Take 1 tablet (25 mg total) by mouth every 6 (six) hours as needed for nausea or vomiting. 02/23/20   Tysinger, Alm RAMAN, PA-C  tamsulosin (FLOMAX) 0.4 MG CAPS capsule Take 0.4 mg by mouth daily. 11/15/23   [provider]  Testosterone  (ANDROGEL  PUMP) 20.25 MG/ACT (1.62%) GEL Place 2 application  onto the skin daily. 06/10/22   Joyce Norleen BROCKS, MD  traMADol  (ULTRAM ) 50 MG tablet Take 1 tablet (50 mg total) by mouth 3 (three) times daily as needed. 07/01/21   Jule Ronal CROME, PA-C  triamcinolone  (NASACORT  AQ) 55 MCG/ACT nasal inhaler Place 2 sprays into the  nose daily. 05/11/13   Joyce Norleen BROCKS, MD    Allergies: Allopurinol , Alpha-gal, and Lactose intolerance (gi)    Review of Systems  Respiratory:  Positive for shortness of breath.   All other systems reviewed and are negative.   Updated Vital Signs BP (!) 132/100   Pulse 87   Temp 97.6 F (36.4 C)   Resp 15   Ht 5' 8 (1.727 m)   Wt 74.8 kg   SpO2 100%   BMI 25.09 kg/m   Physical Exam Vitals and nursing note reviewed.   Gen: NAD Eyes: PERRL, EOMI HEENT: no oropharyngeal swelling Neck: trachea midline Resp: clear to auscultation bilaterally although slightly diminished at bilateral lung bases Card: RRR, no murmurs, rubs, or gallops Abd: nontender,  nondistended Extremities: no calf tenderness, no edema Vascular: 2+ radial pulses bilaterally, 2+ DP pulses bilaterally Skin: no rashes Psyc: acting appropriately   (all labs ordered are listed, but only abnormal results are displayed) Labs Reviewed  BASIC METABOLIC PANEL WITH GFR - Abnormal; Notable for the following components:      Result Value   Glucose, Bld 173 (*)    All other components within normal limits  CBC WITH DIFFERENTIAL/PLATELET  PRO BRAIN NATRIURETIC PEPTIDE  TROPONIN T, HIGH SENSITIVITY    EKG: EKG Interpretation Date/Time:  Monday June 06 2024 08:18:14 EDT Ventricular Rate:  74 PR Interval:  150 QRS Duration:  83 QT Interval:  376 QTC Calculation: 418 R Axis:   70  Text Interpretation: Sinus rhythm Atrial premature complex Minimal ST depression, inferior leads Confirmed by Ula Barter [phone]) on 06/06/2024 8:26:06 AM  Radiology: [facility] Chest 2 View Result Date: 06/06/2024 CLINICAL DATA:  Shortness of breath. EXAM: CHEST - 2 VIEW COMPARISON:  06/02/2024 and CT chest 11/16/2023. FINDINGS: Trachea is midline. Heart size normal. Left perihilar hazy airspace opacification. No pleural fluid. IMPRESSION: Hazy left perihilar airspace opacification indicative of pneumonia. Followup PA and lateral chest X-ray is recommended in 3-4 weeks following trial of antibiotic therapy to ensure resolution and exclude underlying malignancy. Electronically Signed   By: Newell Eke M.D.   On: 06/06/2024 09:28     Procedures   Medications Ordered in the ED  ipratropium-albuterol  (DUONEB) 0.5-2.5 (3) MG/3ML nebulizer solution 3 mL (3 mLs Nebulization Given 06/06/24 0850)                                    Medical Decision Making 73 year old male with past medical history of remote tobacco abuse as well as hyperlipidemia and GERD presenting to the emergency department today with dyspnea on exertion.  I will further evaluate the patient here with basic labs to evaluate for  anemia.  Will obtain an EKG and BNP as well as chest x-ray to eval for pulmonary edema, pulmonary infiltrates, pneumothorax.  The patient does have some nonspecific changes on his initial EKG although somewhat of poor quality.  Will obtain a troponin to eval for atypical ACS although suspicion for this is relatively low.  Given the patient's reassuring vital signs and pulse ox of 98% with normal heart rate suspicion for pulmonary embolism is low at this time.  I will reevaluate for ultimate disposition.  Will give the patient a DuoNeb.  Just to see if this helps with his symptoms given his smoking history.  The patient's work appears reassuring.  He was feeling a little better on ambulation here after the nebulizer treatment.  He is given albuterol  and is discharged with primary care follow-up for PFTs as an outpatient.  His ambulatory pulse ox remained at 100%.  Amount and/or Complexity of Data Reviewed Labs: ordered. Radiology: ordered.  Risk Prescription drug management.        Final diagnoses:  Dyspnea on exertion    ED Discharge Orders          Ordered    albuterol  (VENTOLIN  HFA) 108 (90 Base) MCG/ACT inhaler  Every 6 hours PRN        06/06/24 1317               Ula Prentice SAUNDERS, MD 06/06/24 1318

## 2024-06-06 NOTE — ED Triage Notes (Signed)
 Pt states that for a couple weeks whenever he does anything he'll lose his breath and take forever to catch it. Denies chest pain.

## 2024-07-04 DIAGNOSIS — R051 Acute cough: Secondary | ICD-10-CM | POA: Diagnosis not present

## 2024-07-04 DIAGNOSIS — J189 Pneumonia, unspecified organism: Secondary | ICD-10-CM | POA: Diagnosis not present

## 2024-07-08 DIAGNOSIS — J439 Emphysema, unspecified: Secondary | ICD-10-CM | POA: Diagnosis not present

## 2024-07-08 DIAGNOSIS — F17201 Nicotine dependence, unspecified, in remission: Secondary | ICD-10-CM | POA: Diagnosis not present

## 2024-07-08 DIAGNOSIS — F419 Anxiety disorder, unspecified: Secondary | ICD-10-CM | POA: Diagnosis not present

## 2024-07-08 DIAGNOSIS — R0600 Dyspnea, unspecified: Secondary | ICD-10-CM | POA: Diagnosis not present

## 2024-07-13 ENCOUNTER — Encounter (HOSPITAL_BASED_OUTPATIENT_CLINIC_OR_DEPARTMENT_OTHER): Payer: Self-pay | Admitting: Physician Assistant

## 2024-07-18 NOTE — Progress Notes (Incomplete)
 New Patient Pulmonology Office Visit   Subjective:  Patient ID: Harold Day, male    DOB: 21-Jul-1951  MRN: [id number]  Referred by: Sheldon Netter, PA  CC: dyspnea, emphysema   HPI Harold Day is a 73 y.o. male with ***   {PULM QUESTIONNAIRES (Optional):33196}  ROS  Allergies: Allopurinol , Alpha-gal, and Lactose intolerance (gi)  Current Outpatient Medications:    albuterol  (VENTOLIN  HFA) 108 (90 Base) MCG/ACT inhaler, Inhale 1-2 puffs into the lungs every 6 (six) hours as needed for wheezing or shortness of breath., Disp: 1 each, Rfl: 0   ALPRAZolam  (XANAX ) 0.25 MG tablet, Take 0.25 mg by mouth daily as needed., Disp: , Rfl:    aspirin 81 MG tablet, Take 81 mg by mouth daily., Disp: , Rfl:    atorvastatin  (LIPITOR) 20 MG tablet, TAKE 1 TABLET(20 MG) BY MOUTH DAILY, Disp: 90 tablet, Rfl: 1   buPROPion  (WELLBUTRIN  XL) 150 MG 24 hr tablet, Take 150 mg by mouth every morning., Disp: , Rfl:    colchicine  0.6 MG tablet, Take 1 tablet (0.6 mg total) by mouth daily., Disp: 5 tablet, Rfl: 0   DULoxetine  (CYMBALTA ) 30 MG capsule, TAKE 1 CAPSULE(30 MG) BY MOUTH DAILY, Disp: 30 capsule, Rfl: 0   EPINEPHrine  0.3 mg/0.3 mL IJ SOAJ injection, Inject 0.3 mg into the muscle as needed for anaphylaxis., Disp: 2 each, Rfl: 1   esomeprazole  (NEXIUM ) 20 MG capsule, Take 20 mg by mouth daily at 12 noon., Disp: , Rfl:    febuxostat (ULORIC) 40 MG tablet, Take 40 mg by mouth daily., Disp: , Rfl:    fish oil-omega-3 fatty acids 1000 MG capsule, Take 2 g by mouth daily., Disp: , Rfl:    ibuprofen (ADVIL,MOTRIN) 200 MG tablet, Take 600 mg by mouth every 4 (four) hours., Disp: , Rfl:    loperamide  (IMODIUM  A-D) 2 MG tablet, Take 1 tablet (2 mg total) by mouth at bedtime as needed for diarrhea or loose stools., Disp: 12 tablet, Rfl: 0   Multiple Vitamins-Minerals (MULTIVITAMIN WITH MINERALS) tablet, Take 1 tablet by mouth daily., Disp: , Rfl:    promethazine  (PHENERGAN ) 25 MG tablet, Take 1 tablet  (25 mg total) by mouth every 6 (six) hours as needed for nausea or vomiting., Disp: 20 tablet, Rfl: 0   tamsulosin (FLOMAX) 0.4 MG CAPS capsule, Take 0.4 mg by mouth daily., Disp: , Rfl:    Testosterone  (ANDROGEL  PUMP) 20.25 MG/ACT (1.62%) GEL, Place 2 application  onto the skin daily., Disp: 75 g, Rfl: 5   traMADol  (ULTRAM ) 50 MG tablet, Take 1 tablet (50 mg total) by mouth 3 (three) times daily as needed., Disp: 30 tablet, Rfl: 0   triamcinolone  (NASACORT  AQ) 55 MCG/ACT nasal inhaler, Place 2 sprays into the nose daily., Disp: 1 Inhaler, Rfl: 12 Past Medical History:  Diagnosis Date   Alcoholic (HCC)    Allergic rhinitis    Depression    Former smoker    quit 2011   GERD (gastroesophageal reflux disease)    Hepatitis C    noticed when he tried to give blood; never had illness   Past Surgical History:  Procedure Laterality Date   25 HOUR PH STUDY N/A 09/18/2014   Procedure: 24 HOUR PH STUDY;  Surgeon: Belvie JONETTA Just, MD;  Location: [facility];  Service: Endoscopy;  Laterality: N/A;   COLONOSCOPY     ESOPHAGEAL MANOMETRY N/A 09/18/2014   Procedure: ESOPHAGEAL MANOMETRY (EM);  Surgeon: Belvie JONETTA Just, MD;  Location: [facility];  Service: Endoscopy;  Laterality: N/A;   SHOULDER ARTHROSCOPY Left 04/2017   VASECTOMY     Family History  Problem Relation Age of Onset   Lung cancer Mother    Allergic rhinitis Son    Autism Son    Other Neg Hx    Social History   Socioeconomic History   Marital status: Married    Spouse name: Not on file   Number of children: 2   Years of education: Not on file   Highest education level: Not on file  Occupational History   Occupation: retired (tobacco buyer)    Employer: [employer]  Tobacco Use   Smoking status: Former   Smokeless tobacco: Current    Types: Snuff  Vaping Use   Vaping status: Former  Substance and Sexual Activity   Alcohol use: No    Comment: no alcohol since about 2000   Drug use: No   Sexual activity:  Yes  Other Topics Concern   Not on file  Social History Narrative   Not on file   Social Drivers of Health   Financial Resource Strain: Low Risk  (11/22/2021)   Overall Financial Resource Strain (CARDIA)    Difficulty of Paying Living Expenses: Not hard at all  Food Insecurity: No Food Insecurity (11/22/2021)   Hunger Vital Sign    Worried About Running Out of Food in the Last Year: Never true    Ran Out of Food in the Last Year: Never true  Transportation Needs: No Transportation Needs (11/22/2021)   PRAPARE - Administrator, Civil Service (Medical): No    Lack of Transportation (Non-Medical): No  Physical Activity: Insufficiently Active (11/22/2021)   Exercise Vital Sign    Days of Exercise per Week: 4 days    Minutes of Exercise per Session: 20 min  Stress: No Stress Concern Present (11/22/2021)   [employer] of Occupational Health - Occupational Stress Questionnaire    Feeling of Stress : Not at all  Social Connections: Not on file  Intimate Partner Violence: Not on file       Objective:  There were no vitals taken for this visit. {Pulm Vitals (Optional):32837}  Physical Exam  Diagnostic Review:  {Labs (Optional):32838} CXR 07/05/24: Normal CXR CXR 06/06/24: Hazy left perihilar airspace opacification indicative of pneumonia.   CT chest 11/25/2023, I personally reviewed CT scan and I dont see significant emphysema Centrilobular and paraseptal emphysema.  Previous nodules in the right upper lobe measure up to 4.9 mm resolve compatible with infection. Lung-RADS 2, benign appearance or behavior. Continue annual screening with low-dose chest CT without contrast in 12 months.    CT chest 12/29/2022 1. Lung-RADS 3, probably benign findings. Short-term follow-up in 6 months is recommended with repeat low-dose chest CT without contrast (please use the following order, CT CHEST LCS NODULE FOLLOW-UP W/O CM). New indistinct peripheral right upper lobe nodules up to  4.9 mm, potentially inflammatory  Assessment & Plan:  Dyspnea Sob Smoking? On lung cancer screening Needs PFTs  Assessment & Plan    No follow-ups on file.    Marny Patch, MD Pulmonary and Critical Care Medicine [facility]

## 2024-07-19 ENCOUNTER — Ambulatory Visit (INDEPENDENT_AMBULATORY_CARE_PROVIDER_SITE_OTHER)

## 2024-07-19 VITALS — BP 168/78 | HR 80 | Temp 97.9°F | Ht 68.0 in | Wt 166.4 lb

## 2024-07-19 DIAGNOSIS — J4520 Mild intermittent asthma, uncomplicated: Secondary | ICD-10-CM

## 2024-07-19 DIAGNOSIS — R0609 Other forms of dyspnea: Secondary | ICD-10-CM

## 2024-07-19 DIAGNOSIS — Z87891 Personal history of nicotine dependence: Secondary | ICD-10-CM

## 2024-07-19 DIAGNOSIS — E782 Mixed hyperlipidemia: Secondary | ICD-10-CM | POA: Diagnosis not present

## 2024-07-19 DIAGNOSIS — F331 Major depressive disorder, recurrent, moderate: Secondary | ICD-10-CM | POA: Diagnosis not present

## 2024-07-19 MED ORDER — ALBUTEROL SULFATE HFA 108 (90 BASE) MCG/ACT IN AERS: 1.0000 | INHALATION_SPRAY | Freq: Four times a day (QID) | RESPIRATORY_TRACT | 4 refills | Status: DC | PRN

## 2024-07-19 MED ORDER — FLUTICASONE-SALMETEROL 100-50 MCG/ACT IN AEPB: 1.0000 | INHALATION_SPRAY | Freq: Two times a day (BID) | RESPIRATORY_TRACT | 2 refills | Status: DC

## 2024-07-19 NOTE — Patient Instructions (Signed)
 Dear Ms. Tipps,   Your shortness of breath is likely related to bronchospasm. Advair inhaler seems to work well for you. I will continue the same inhaler and in addition to the albuterol  as need it.  I ordered a pulmonary function test to evaluate if you have COPD. I am glad to hear you had already quitted smoking!  I will see you in 4 months to adjust the inhaler dose.

## 2024-08-03 DIAGNOSIS — J449 Chronic obstructive pulmonary disease, unspecified: Secondary | ICD-10-CM | POA: Diagnosis not present

## 2024-08-04 DIAGNOSIS — K080 Exfoliation of teeth due to systemic causes: Secondary | ICD-10-CM | POA: Diagnosis not present

## 2024-08-09 DIAGNOSIS — E291 Testicular hypofunction: Secondary | ICD-10-CM | POA: Diagnosis not present

## 2024-08-09 DIAGNOSIS — H9190 Unspecified hearing loss, unspecified ear: Secondary | ICD-10-CM | POA: Diagnosis not present

## 2024-08-19 DIAGNOSIS — F331 Major depressive disorder, recurrent, moderate: Secondary | ICD-10-CM | POA: Diagnosis not present

## 2024-08-19 DIAGNOSIS — E782 Mixed hyperlipidemia: Secondary | ICD-10-CM | POA: Diagnosis not present

## 2024-08-22 ENCOUNTER — Ambulatory Visit

## 2024-08-22 ENCOUNTER — Ambulatory Visit: Admitting: Podiatry

## 2024-08-22 DIAGNOSIS — G5762 Lesion of plantar nerve, left lower limb: Secondary | ICD-10-CM

## 2024-08-22 NOTE — Patient Instructions (Signed)

## 2024-08-22 NOTE — Progress Notes (Signed)
  Subjective:  Patient ID: Harold Day, male    DOB: Feb 21, 1951,  MRN: [id number]  Chief Complaint  Patient presents with   Toe Pain    Pt stated that he gets these sharps pain in his toes he stated that it feels like an electrical shock in his toes. he stated that his feet will be hot and cold     Discussed the use of AI scribe software for clinical note transcription with the patient, who gave verbal consent to proceed.  History of Present Illness Harold Day is a 73 year old male with a history of gout who presents with left foot pain.  He experiences nerve-like pain in the left foot, primarily affecting the ring toe and occasionally the third toe, for about one month. The pain occurs both day and night, sometimes waking him. There is no radiation of pain up the leg and no history of foot injury.  He has a history of gout, with the last flare two to three months ago affecting the big toe, treated with a steroid injection.   He is allergic to allopurinol  and did not tolerate gabapentin  well for previous hip problems.      Objective:  There were no vitals filed for this visit.  Physical Exam General: AAO x3, NAD  Dermatological: Skin is warm, dry and supple bilateral. There are no open sores, no preulcerative lesions, no rash or signs of infection present.  Vascular: Dorsalis Pedis artery and Posterior Tibial artery pedal pulses are 2/4 bilateral with immedate capillary fill time. There is no pain with calf compression, swelling, warmth, erythema.   Neruologic: Grossly intact via light touch bilateral.  Station intact with Semmes Weinstein monofilament.  Negative sign.  Musculoskeletal: There is tenderness palpation to the left foot second interspace.  There is no area of pinpoint tenderness.  There is some slight edema present on the second interspace plantarly but there is no erythema or warmth.  There is no pain to the right foot.  MMT 5/5.     No images are  attached to the encounter.    Results RADIOLOGY Foot X-ray: Multiple views of bilateral feet were obtained.  Is no evidence of acute fracture.  There is some arthritic changes present.   Assessment:   1. Neuroma of second interspace of left foot      Plan:  Patient was evaluated and treated and all questions answered.  Assessment and Plan Assessment & Plan Left foot Morton's neuroma Intermittent sharp nerve-like pain in the left foot, likely due to neuroma or nerve irritation. X-rays show arthritis but no major abnormalities or palpable mass. - Administered steroid injection with informed consent, discussed potential temporary numbness and steroid flare.  Verbal consent obtained.  Cleaned skin with alcohol.  A mixture of 1 cc betamethasone , 1 cc Marcaine  plain was infiltrated the second interspace complications.  Postinjection care discussed. - Advised icing the foot post-activity to reduce inflammation. - Provided metatarsal pads to alleviate pressure. - Instructed to report if symptoms do not improve.    Return if symptoms worsen or fail to improve.   Donnice JONELLE Fees DPM

## 2024-09-02 ENCOUNTER — Other Ambulatory Visit: Payer: Self-pay | Admitting: Podiatry

## 2024-09-02 ENCOUNTER — Telehealth: Payer: Self-pay | Admitting: Podiatry

## 2024-09-02 MED ORDER — METHYLPREDNISOLONE 4 MG PO TBPK: ORAL_TABLET | ORAL | 0 refills | Status: DC

## 2024-09-02 NOTE — Telephone Encounter (Signed)
 Seen on 11/3 and received an injection to the left first toe. He reports that the pain is still significant and becomes more intense at night. The first available appointment is scheduled for 09/09/24 at 9:45 AM. He is scheduled but would like to know if you have any additional recommendations in the meantime. He uses the following pharmacy if medication is needed: [id number] - [place], [place] - [address] Phone: [phone]  Fax: [phone]

## 2024-09-02 NOTE — Telephone Encounter (Signed)
 Thank you for the update!

## 2024-09-09 ENCOUNTER — Ambulatory Visit: Admitting: Podiatry

## 2024-09-09 DIAGNOSIS — G5762 Lesion of plantar nerve, left lower limb: Secondary | ICD-10-CM | POA: Diagnosis not present

## 2024-09-09 MED ORDER — TRIAMCINOLONE ACETONIDE 10 MG/ML IJ SUSP
10.0000 mg | Freq: Once | INTRAMUSCULAR | Status: AC
  Administered 2024-09-09: 10 mg via INTRAMUSCULAR

## 2024-09-09 MED ORDER — MELOXICAM 7.5 MG PO TABS: 7.5000 mg | ORAL_TABLET | Freq: Every day | ORAL | 0 refills | Status: DC | PRN

## 2024-09-09 NOTE — Patient Instructions (Signed)
 While at your visit today you received a steroid injection in your foot or ankle to help with your pain. Along with having the steroid medication there is some numbing medication in the shot that you received. Due to this you may notice some numbness to the area for the next couple of hours.   I would recommend limiting activity for the next few days to help the steroid injection take affect.    The actually benefit from the steroid injection may take up to 2-7 days to see a difference. You may actually experience a small (as in 10%) INCREASE in pain in the first 24 hours---that is common. It would be best if you can ice the area today and take anti-inflammatory medications (such as Ibuprofen, Motrin, or Aleve) if you are able to take these medications. If you were prescribed another medication to help with the pain go ahead and start that medication today    Things to watch out for that you should contact us  or a health care provider urgently would include: 1. Unusual (as in more than 10%) increase in pain 2. New fever > 101.5 3. New swelling or redness of the injected area.  4. Streaking of red lines around the area injected.  If you have any questions or concerns about this, please give our office a call at [phone].    -- Meloxicam  Tablets What is this medication? MELOXICAM  (mel OX i cam) treats mild to moderate pain, inflammation, or arthritis. It works by decreasing inflammation. It belongs to a group of medications called NSAIDs. This medicine may be used for other purposes; ask your health care provider or pharmacist if you have questions. COMMON BRAND NAME(S): Mobic  What should I tell my care team before I take this medication? They need to know if you have any of these conditions: Asthma Bleeding problems Dehydration Frequently drink alcohol Have had a heart attack, stroke, or mini-stroke Heart bypass surgery, or CABG, within the past 2 weeks Heart or blood vessel  conditions Heart failure High blood pressure Kidney disease Liver disease Stomach bleeding Stomach ulcers, other stomach or intestine problems Tobacco use An unusual or allergic reaction to meloxicam , other medications, foods, dyes, or preservatives Pregnant or trying to get pregnant Breastfeeding How should I use this medication? Take this medication by mouth. Take it as directed on the prescription label at the same time every day. You can take it with or without food. If it upsets your stomach, take it with food. Do not use it more often than directed. There may be unused or extra doses in the bottle after you finish your treatment. Talk to your care team if you have questions about your dose. A special MedGuide will be given to you by the pharmacist with each prescription and refill. Be sure to read this information carefully each time. Talk to your care team about the use of this medication in children. Special care may be needed. People over 103 years of age may have a stronger reaction and need a smaller dose. Overdosage: If you think you have taken too much of this medicine contact a poison control center or emergency room at once. NOTE: This medicine is only for you. Do not share this medicine with others. What if I miss a dose? If you miss a dose, take it as soon as you can. If it is almost time for your next dose, take only that dose. Do not take double or extra doses. What may interact with  this medication? Do not take this medication with any of the following: Cidofovir Ketorolac  This medication may also interact with the following: Alcohol Aspirin and aspirin-like medications Blood thinners Cyclosporine Digoxin Diuretics Lithium Medications for high blood pressure Methotrexate Other NSAIDs, medications for pain and inflammation, such as ibuprofen or naproxen Some medications for depression Steroid medications, such as prednisone  or cortisone Supplements, such as  garlic, ginger, ginkgo, methylsulfonylmethane (MSM) This list may not describe all possible interactions. Give your health care provider a list of all the medicines, herbs, non-prescription drugs, or dietary supplements you use. Also tell them if you smoke, drink alcohol, or use illegal drugs. Some items may interact with your medicine. What should I watch for while using this medication? Visit your care team for regular checks on your progress. Tell your care team if your symptoms do not start to get better or if they get worse. Do not take aspirin or other NSAIDs, such as ibuprofen or naproxen, while you are taking this medication. Side effects, such as upset stomach, nausea, and ulcers, may be more likely to occur. Many over-the-counter medications contain aspirin, ibuprofen, or naproxen. It is important to read labels carefully. Talk to your care team about all the medications you take. They can tell you what is safe to take together. This medication can cause serious bleeding, ulcers, or tears in the stomach. These problems can occur at any time and with no warning signs. They are more common with long-term use. Talk to your care team right away if you have stomach pain, bloody or black, tar-like stools, or vomit blood that is red or looks like coffee grounds. This medication increases the risk of blood clots, heart attack, and stroke. These events can occur at any time. They are more common with long-term use and in those who have heart disease. If you take aspirin to prevent a heart attack or stroke, talk to your care team. They can help you find an option that works for you. This medication may cause serious skin reactions. They can happen weeks to months after starting the medication. Talk to your care team right away if you have fevers or flu-like symptoms with a rash. The rash may be red or purple and then turn into blisters or peeling of the skin. Or you might notice a red rash with swelling of  the face, lips, or lymph nodes in your neck or under your arms. Talk to your care team if you may be pregnant. Taking this medication after 20 weeks of pregnancy may cause serious birth defects. Use of this medication after 30 weeks of pregnancy is not recommended. This medication may cause infertility. It is usually temporary. Talk to your care team if you are concerned about your fertility. What side effects may I notice from receiving this medication? Side effects that you should report to your care team as soon as possible: Allergic reactions--skin rash, itching, hives, swelling of the face, lips, tongue, or throat Bleeding--bloody or black, tar-like stools, vomiting blood or brown material that looks like coffee grounds, red or dark brown urine, small red or purple spots on skin, unusual bruising or bleeding Heart attack--pain or tightness in the chest, shoulders, arms, or jaw, nausea, shortness of breath, cold or clammy skin, feeling faint or lightheaded Heart failure--shortness of breath, swelling of the ankles, feet, or hands, sudden weight gain, unusual weakness or fatigue Increase in blood pressure Kidney injury--decrease in the amount of urine, swelling of the ankles, hands, or feet  Liver injury--right upper belly pain, loss of appetite, nausea, light-colored stool, dark yellow or brown urine, yellowing skin or eyes, unusual weakness or fatigue Rash, fever, and swollen lymph nodes Redness, blistering, peeling, or loosening of the skin, including inside the mouth Round red or dark patches on the skin that may itch, burn, and blister Stroke--sudden numbness or weakness of the face, arm, or leg, trouble speaking, confusion, trouble walking, loss of balance or coordination, dizziness, severe headache, change in vision Side effects that usually do not require medical attention (report these to your care team if they continue or are bothersome): Headache Loss of appetite Nausea Upset  stomach This list may not describe all possible side effects. Call your doctor for medical advice about side effects. You may report side effects to FDA at 1-800-FDA-1088. Where should I keep my medication? Keep out of the reach of children and pets. Store at room temperature between 20 and 25 degrees C (68 and 77 degrees F). Protect from moisture. Keep the container tightly closed. Get rid of any unused medication after the expiration date. To get rid of medications that are no longer needed or have expired: Take the medication to a medication take-back program. Check with your pharmacy or law enforcement to find a location. If you cannot return the medication, check the label or package insert to see if the medication should be thrown out in the garbage or flushed down the toilet. If you are not sure, ask your care team. If it is safe to put it in the trash, empty the medication out of the container. Mix the medication with cat litter, dirt, coffee grounds, or other unwanted substance. Seal the mixture in a bag or container. Put it in the trash. NOTE: This sheet is a summary. It may not cover all possible information. If you have questions about this medicine, talk to your doctor, pharmacist, or health care provider.  2025 Elsevier/Gold Standard (2023-12-15 00:00:00)

## 2024-09-09 NOTE — Progress Notes (Signed)
 Subjective: Chief Complaint  Patient presents with   Injections    Left foot Morton's neuroma IMS 2 injection x 1. 1 pain. Non diabetic.     73 year old male presents the office with above concerns.  He states that the injection helped for about a week and the pain does fluctuate.  He does not recall any specific shoe or activity that worsens or helps.  Some minimal swelling on the ball of the foot.  No recent injuries or changes.  Objective: AAO x3, NAD DP/PT pulses palpable bilaterally, CRT less than 3 seconds There is palpation on second or also today seems to be most in the third interspace.  No palpable neuroma identified this time but there is prominence of metatarsal head plantarly without to the fat pad.  No specific area of pinpoint tenderness.  There is no significant edema.  There is no erythema or warmth. No pain with calf compression, swelling, warmth, erythema  Assessment: 73 year old male with concern for neuroma/capsulitis  Plan: -All treatment options discussed with the patient including all alternatives, risks, complications.  -He wishes to proceed with another injection today.  Verbal consent obtained.  Today the third interspace on the left foot with injected with a mixture of 0.75 cc of betamethasone ,0.75 cc Marcaine  plain and the skin was cleaned with alcohol.  Postinjection care discussed.  Tolerated well. -Dispensed gel metatarsal pad.  Continue with the metatarsal pads of his shoe as well. - If no improvement consider advanced imaging  Return if symptoms worsen or fail to improve.  Donnice JONELLE Fees DPM

## 2024-09-18 DIAGNOSIS — F331 Major depressive disorder, recurrent, moderate: Secondary | ICD-10-CM | POA: Diagnosis not present

## 2024-09-18 DIAGNOSIS — E782 Mixed hyperlipidemia: Secondary | ICD-10-CM | POA: Diagnosis not present

## 2024-10-17 NOTE — Progress Notes (Signed)
 "  New Patient Pulmonology Office Visit   Subjective:  Patient ID: Harold Day, male    DOB: 1951/01/03  MRN: [id number]  Referred by: Adrien Guan, Tamala, MD  CC: dyspnea for a month.  HPI Harold Day is a 73 y.o. male without pulmonary condition in the past, who came for evaluation of dyspnea.  Patient report he has dyspnea with exertion for almost a month.  He is able to walk around the house, and walk 0.25 miles.  He can walk more than that because of his hip pain. He is not aware of any event that trigger his dyspnea.  He feels more fatigue.  Denied cough, mucus production, history of asthma or COPD in the past.    He has a history of smoking a pack a day for 40 years and he quit in 2023.  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  Denies vaping, e-cigarettes, or nicotine use. He had pneumonia prior initial visit and received antibiotics and advair.  Interval history: -Patient reported after 3 months of Advair, he has not noticed a significant change on his dyspnea. He uses albuterol  as need with brief relief. -Her dyspnea is about the same, slightly worst given that he got flu recently. He feels winded with ADLs, as taking shower, walking around the house. Prior around June 2025, he was able to play golf without difficulty. -He is on lung ca screening, his last CT scan in Jan 2025- was unremarkable. -He denies wheezing, cough, chest pain, lower extremity edema, orthopnea. - He denied environmental allergies, however he has alpha gal allergy.    ROS reviewed as above  Allergies: Allopurinol , Alpha-gal, and Lactose intolerance (gi)  Current Outpatient Medications:    albuterol  (VENTOLIN  HFA) 108 (90 Base) MCG/ACT inhaler, Inhale 1-2 puffs into the lungs every 6 (six) hours as needed for wheezing or shortness of breath., Disp: 1 each, Rfl: 4   ALPRAZolam  (XANAX ) 0.25 MG tablet, Take 0.25 mg by mouth daily as needed., Disp: , Rfl:     aspirin 81 MG tablet, Take 81 mg by mouth daily., Disp: , Rfl:    atorvastatin  (LIPITOR) 20 MG tablet, TAKE 1 TABLET(20 MG) BY MOUTH DAILY, Disp: 90 tablet, Rfl: 1   buPROPion  (WELLBUTRIN  XL) 150 MG 24 hr tablet, Take 150 mg by mouth every morning., Disp: , Rfl:    colchicine  0.6 MG tablet, Take 1 tablet (0.6 mg total) by mouth daily. (Patient taking differently: Take 0.6 mg by mouth as needed.), Disp: 5 tablet, Rfl: 0   DULoxetine  (CYMBALTA ) 30 MG capsule, TAKE 1 CAPSULE(30 MG) BY MOUTH DAILY, Disp: 30 capsule, Rfl: 0   EPINEPHrine  0.3 mg/0.3 mL IJ SOAJ injection, Inject 0.3 mg into the muscle as needed for anaphylaxis., Disp: 2 each, Rfl: 1   esomeprazole  (NEXIUM ) 20 MG capsule, Take 20 mg by mouth daily at 12 noon., Disp: , Rfl:    febuxostat (ULORIC) 40 MG tablet, Take 40 mg by mouth daily., Disp: , Rfl:    fish oil-omega-3 fatty acids 1000 MG capsule, Take 2 g by mouth daily., Disp: , Rfl:    fluticasone -salmeterol (ADVAIR) 100-50 MCG/ACT AEPB, Inhale 1 puff into the lungs 2 (two) times daily., Disp: 60 each, Rfl: 2   ibuprofen (ADVIL,MOTRIN) 200 MG tablet, Take 600 mg by mouth every 4 (four) hours. (Patient taking differently: Take 600 mg by mouth every 4 (four) hours as needed.), Disp: , Rfl:  loperamide  (IMODIUM  A-D) 2 MG tablet, Take 1 tablet (2 mg total) by mouth at bedtime as needed for diarrhea or loose stools., Disp: 12 tablet, Rfl: 0   meloxicam  (MOBIC ) 7.5 MG tablet, Take 1 tablet (7.5 mg total) by mouth daily as needed for pain., Disp: 30 tablet, Rfl: 0   Multiple Vitamins-Minerals (MULTIVITAMIN WITH MINERALS) tablet, Take 1 tablet by mouth daily., Disp: , Rfl:    promethazine  (PHENERGAN ) 25 MG tablet, Take 1 tablet (25 mg total) by mouth every 6 (six) hours as needed for nausea or vomiting., Disp: 20 tablet, Rfl: 0   tamsulosin (FLOMAX) 0.4 MG CAPS capsule, Take 0.4 mg by mouth daily., Disp: , Rfl:    Testosterone  (ANDROGEL  PUMP) 20.25 MG/ACT (1.62%) GEL, Place 2 application  onto  the skin daily., Disp: 75 g, Rfl: 5   traMADol  (ULTRAM ) 50 MG tablet, Take 1 tablet (50 mg total) by mouth 3 (three) times daily as needed., Disp: 30 tablet, Rfl: 0   triamcinolone  (NASACORT  AQ) 55 MCG/ACT nasal inhaler, Place 2 sprays into the nose daily., Disp: 1 Inhaler, Rfl: 12 Past Medical History:  Diagnosis Date   Alcoholic (HCC)    Allergic rhinitis    Depression    Former smoker    quit 2011   GERD (gastroesophageal reflux disease)    Hepatitis C    noticed when he tried to give blood; never had illness   Past Surgical History:  Procedure Laterality Date   31 HOUR PH STUDY N/A 09/18/2014   Procedure: 24 HOUR PH STUDY;  Surgeon: Belvie JONETTA Just, MD;  Location: [facility];  Service: Endoscopy;  Laterality: N/A;   COLONOSCOPY     ESOPHAGEAL MANOMETRY N/A 09/18/2014   Procedure: ESOPHAGEAL MANOMETRY (EM);  Surgeon: Belvie JONETTA Just, MD;  Location: [facility];  Service: Endoscopy;  Laterality: N/A;   SHOULDER ARTHROSCOPY Left 04/2017   VASECTOMY     Family History  Problem Relation Age of Onset   Lung cancer Mother    Allergic rhinitis Son    Autism Son    Other Neg Hx    Social History   Socioeconomic History   Marital status: Married    Spouse name: Not on file   Number of children: 2   Years of education: Not on file   Highest education level: Not on file  Occupational History   Occupation: retired (tobacco buyer)    Employer: [employer]  Tobacco Use   Smoking status: Former   Smokeless tobacco: Current    Types: Snuff   Tobacco comments:    Smoked for about 40 years, a little less than 1ppd.    Quit smoking in 2023  Vaping Use   Vaping status: Former  Substance and Sexual Activity   Alcohol use: No    Comment: no alcohol since about 2000   Drug use: No   Sexual activity: Yes  Other Topics Concern   Not on file  Social History Narrative   Not on file   Social Drivers of Health   Tobacco Use: High Risk (07/19/2024)   Patient History     Smoking Tobacco Use: Former    Smokeless Tobacco Use: Current    Passive Exposure: Not on file  Financial Resource Strain: Low Risk (11/22/2021)   Overall Financial Resource Strain (CARDIA)    Difficulty of Paying Living Expenses: Not hard at all  Food Insecurity: No Food Insecurity (11/22/2021)   Hunger Vital Sign    Worried About Running Out  of Food in the Last Year: Never true    Ran Out of Food in the Last Year: Never true  Transportation Needs: No Transportation Needs (11/22/2021)   PRAPARE - Administrator, Civil Service (Medical): No    Lack of Transportation (Non-Medical): No  Physical Activity: Insufficiently Active (11/22/2021)   Exercise Vital Sign    Days of Exercise per Week: 4 days    Minutes of Exercise per Session: 20 min  Stress: No Stress Concern Present (11/22/2021)   [employer] of Occupational Health - Occupational Stress Questionnaire    Feeling of Stress : Not at all  Social Connections: Not on file  Intimate Partner Violence: Not on file  Depression (PHQ2-9): Low Risk (09/29/2022)   Depression (PHQ2-9)    PHQ-2 Score: 3  Alcohol Screen: Not on file  Housing: Not on file  Utilities: Not on file  Health Literacy: Not on file    Has pets - 2 dogs, no birds  Immunization History  Administered Date(s) Administered   DT (Pediatric) 02/01/1996   Hepatitis A, Adult 07/05/2015, 08/06/2015   INFLUENZA, HIGH DOSE SEASONAL PF 07/17/2016, 08/20/2017   Influenza Split 09/08/2002, 07/23/2006   Influenza, Quadrivalent, Recombinant, Inj, Pf 07/07/2019   Influenza,inj,Quad PF,6+ Mos 07/05/2015, 08/21/2018   PFIZER(Purple Top)SARS-COV-2 Vaccination 12/03/2019, 12/25/2019, 09/16/2020   Pneumococcal Conjugate-13 07/17/2016   Pneumococcal Polysaccharide-23 08/20/2017   Td 07/23/2006   Tdap 07/17/2016   Zoster Recombinant(Shingrix) 08/25/2017, 02/06/2018   Zoster, Live 03/08/2012      Objective:  There were no vitals taken for this visit. SpO2 Readings  from Last 3 Encounters:  07/19/24 98%  06/06/24 100%  11/30/23 100%   His Blood pressure was high today, and he tells me he checks his BP at home and it is normal. I mentioned to check his BP at home more often, he may need a medication.  Physical Exam Constitutional:      Appearance: Normal appearance. He is normal weight.  HENT:     Head: Normocephalic.  Eyes:     Extraocular Movements: Extraocular movements intact.     Pupils: Pupils are equal, round, and reactive to light.  Cardiovascular:     Rate and Rhythm: Normal rate and regular rhythm.  Pulmonary:     Effort: Pulmonary effort is normal.     Breath sounds: Normal breath sounds.  Musculoskeletal:        General: Normal range of motion.  Skin:    General: Skin is warm.  Neurological:     General: No focal deficit present.     Mental Status: He is alert and oriented to person, place, and time.    Diagnostic Review:   CXR 07/05/24: Normal CXR  CXR 06/06/24: Hazy left perihilar airspace opacification indicative of pneumonia.   CT chest 11/25/2023, I personally reviewed CT scan and I dont see significant emphysema. Centrilobular and paraseptal emphysema.  Previous nodules in the right upper lobe measure up to 4.9 mm resolve compatible with infection. Lung-RADS 2, benign appearance or behavior. Continue annual screening with low-dose chest CT without contrast in 12 months.    CT chest 12/29/2022 1. Lung-RADS 3, probably benign findings. Short-term follow-up in 6 months is recommended with repeat low-dose chest CT without contrast (please use the following order, CT CHEST LCS NODULE FOLLOW-UP W/O CM). New indistinct peripheral right upper lobe nodules up to 4.9 mm, potentially inflammatory  pFTs  10/18/24 FVC    3.3/-1.5/79% F/F      2.6/-0.9/86% TLC  6.3/-0.6/92% RV      2.9/121% DLCO 16.7/-2/67%  10/18/24: no restriction, no obstruction, mild reduction of DLCO. No BD response  His IgE in the past was around  500, and his eos 100.  Assessment & Plan:  Dyspnea on exertion of unknown etiology  Patient reported dyspnea with exertion since 05/2024.  Denies cough, wheezing, sputum production.  He has been on advair without significant improvement, however albuterol  give some brief relief. His PFTs today was unremarkable. His last CT scan a year ago was normal. He continues to have shortness of breath, and slightly worst. I dont think his dyspnea is caused by pulmonary component, given his normal PFTs and normal CT scan, and no improvement with advair. I will r/o cardiac causes. Plan: - Increase dose of  Advair twice daily for a trial. - Continue albuterol  as need it - Ddimer, probnp - Echo   2. Prior smoking history      Enrolled on Lung cancer screening  Patient has 40yphx, quitted in 2023. He is on lung cancer screening.  Last CT scan was this year with normal appearance. Ordering CT scan in January.   Return in about 2 months (around 12/17/2024).    Marny Patch, MD Pulmonary and Critical Care Medicine [facility]  "

## 2024-10-18 ENCOUNTER — Ambulatory Visit

## 2024-10-18 VITALS — BP 134/62 | HR 88 | Temp 97.8°F | Ht 69.0 in | Wt 166.0 lb

## 2024-10-18 DIAGNOSIS — R0600 Dyspnea, unspecified: Secondary | ICD-10-CM

## 2024-10-18 DIAGNOSIS — J4520 Mild intermittent asthma, uncomplicated: Secondary | ICD-10-CM

## 2024-10-18 DIAGNOSIS — R0609 Other forms of dyspnea: Secondary | ICD-10-CM

## 2024-10-18 DIAGNOSIS — Z87891 Personal history of nicotine dependence: Secondary | ICD-10-CM

## 2024-10-18 LAB — PULMONARY FUNCTION TEST
DL/VA % pred: 74 %
DL/VA: 3 ml/min/mmHg/L
DLCO unc % pred: 67 %
DLCO unc: 16.76 ml/min/mmHg
FEF 25-75 Post: 2.68 L/s
FEF 25-75 Pre: 2.47 L/s
FEF2575-%Change-Post: 8 %
FEF2575-%Pred-Post: 120 %
FEF2575-%Pred-Pre: 110 %
FEV1-%Change-Post: 3 %
FEV1-%Pred-Post: 89 %
FEV1-%Pred-Pre: 86 %
FEV1-Post: 2.69 L
FEV1-Pre: 2.59 L
FEV1FVC-%Change-Post: 2 %
FEV1FVC-%Pred-Pre: 107 %
FEV6-%Change-Post: 0 %
FEV6-%Pred-Post: 85 %
FEV6-%Pred-Pre: 84 %
FEV6-Post: 3.31 L
FEV6-Pre: 3.28 L
FEV6FVC-%Change-Post: 0 %
FEV6FVC-%Pred-Post: 105 %
FEV6FVC-%Pred-Pre: 106 %
FVC-%Change-Post: 1 %
FVC-%Pred-Post: 80 %
FVC-%Pred-Pre: 79 %
FVC-Post: 3.34 L
FVC-Pre: 3.3 L
Post FEV1/FVC ratio: 81 %
Post FEV6/FVC ratio: 99 %
Pre FEV1/FVC ratio: 79 %
Pre FEV6/FVC Ratio: 100 %
RV % pred: 121 %
RV: 2.98 L
TLC % pred: 92 %
TLC: 6.32 L

## 2024-10-18 MED ORDER — FLUTICASONE-SALMETEROL 230-21 MCG/ACT IN AERO: 2.0000 | INHALATION_SPRAY | Freq: Two times a day (BID) | RESPIRATORY_TRACT | 6 refills | Status: AC

## 2024-10-18 MED ORDER — ALBUTEROL SULFATE HFA 108 (90 BASE) MCG/ACT IN AERS: 1.0000 | INHALATION_SPRAY | Freq: Four times a day (QID) | RESPIRATORY_TRACT | 6 refills | Status: AC | PRN

## 2024-10-18 NOTE — Patient Instructions (Addendum)
 Dear Harold Day,  Your pulmonary function test today was overall normal. It did not show COPD or asthma.  I will recommend the following: -CT scan of your chest -Ultrasound of your heart to rule out cardiac problems. -I increase the advair dose, to see if this help you with your shortness of breath.  -Blood test today, I will let you know when I receive the results.   I will see you in 2 month for a follow up.

## 2024-10-18 NOTE — Progress Notes (Signed)
 Full pft performed today

## 2024-10-18 NOTE — Patient Instructions (Signed)
 Full pft performed today

## 2024-10-19 ENCOUNTER — Ambulatory Visit: Payer: Self-pay

## 2024-10-21 LAB — CARDIO IQ® NT PROBNP: NT PROBNP: <36 pg/mL

## 2024-10-21 LAB — D-DIMER, QUANTITATIVE: D-Dimer, Quant: 0.49 ug{FEU}/mL

## 2024-10-21 LAB — EXTRA SPECIMEN

## 2024-10-25 ENCOUNTER — Ambulatory Visit (HOSPITAL_COMMUNITY)
Admission: RE | Admit: 2024-10-25 | Discharge: 2024-10-25 | Disposition: A | Discharge status: Home / Self Care | Source: Ambulatory Visit | Attending: Cardiology | Admitting: Cardiology

## 2024-10-25 DIAGNOSIS — R0600 Dyspnea, unspecified: Secondary | ICD-10-CM | POA: Insufficient documentation

## 2024-10-25 LAB — ECHOCARDIOGRAM COMPLETE
Area-P 1/2: 3.13 cm2
S' Lateral: 2.6 cm

## 2024-11-03 ENCOUNTER — Ambulatory Visit (HOSPITAL_BASED_OUTPATIENT_CLINIC_OR_DEPARTMENT_OTHER)
Admission: RE | Admit: 2024-11-03 | Discharge: 2024-11-03 | Disposition: A | Discharge status: Home / Self Care | Source: Ambulatory Visit

## 2024-11-03 DIAGNOSIS — R0600 Dyspnea, unspecified: Secondary | ICD-10-CM | POA: Insufficient documentation

## 2024-11-10 ENCOUNTER — Other Ambulatory Visit: Payer: Self-pay | Admitting: Podiatry

## 2024-11-30 ENCOUNTER — Ambulatory Visit: Payer: Medicare Other | Admitting: Allergy

## 2024-12-14 ENCOUNTER — Ambulatory Visit
